# Patient Record
Sex: Male | Born: 1980 | Race: Black or African American | Hispanic: No | Marital: Single | Smoking: Current every day smoker
Health system: Southern US, Community
[De-identification: ages and names within clinical notes are randomized; demographics above are authoritative.]

## PROBLEM LIST (undated history)

## (undated) DIAGNOSIS — J45909 Unspecified asthma, uncomplicated: Secondary | ICD-10-CM

## (undated) DIAGNOSIS — I10 Essential (primary) hypertension: Secondary | ICD-10-CM

## (undated) DIAGNOSIS — G4730 Sleep apnea, unspecified: Secondary | ICD-10-CM

## (undated) DIAGNOSIS — F4310 Post-traumatic stress disorder, unspecified: Secondary | ICD-10-CM

## (undated) DIAGNOSIS — E119 Type 2 diabetes mellitus without complications: Secondary | ICD-10-CM

## (undated) DIAGNOSIS — F319 Bipolar disorder, unspecified: Secondary | ICD-10-CM

## (undated) DIAGNOSIS — H5789 Other specified disorders of eye and adnexa: Secondary | ICD-10-CM

## (undated) DIAGNOSIS — G47419 Narcolepsy without cataplexy: Secondary | ICD-10-CM

## (undated) HISTORY — DX: Unspecified asthma, uncomplicated: J45.909

## (undated) HISTORY — PX: TONSILLECTOMY: SUR1361

## (undated) HISTORY — DX: Type 2 diabetes mellitus without complications: E11.9

---

## 2006-02-03 ENCOUNTER — Emergency Department (HOSPITAL_COMMUNITY): Admission: EM | Admit: 2006-02-03 | Discharge: 2006-02-03 | Payer: Self-pay | Admitting: Emergency Medicine

## 2006-03-18 ENCOUNTER — Emergency Department (HOSPITAL_COMMUNITY): Admission: EM | Admit: 2006-03-18 | Discharge: 2006-03-18 | Payer: Self-pay | Admitting: Emergency Medicine

## 2007-09-05 ENCOUNTER — Emergency Department (HOSPITAL_COMMUNITY): Admission: EM | Admit: 2007-09-05 | Discharge: 2007-09-06 | Payer: Self-pay | Admitting: Emergency Medicine

## 2008-12-08 ENCOUNTER — Emergency Department (HOSPITAL_COMMUNITY): Admission: EM | Admit: 2008-12-08 | Discharge: 2008-12-09 | Payer: Self-pay | Admitting: Emergency Medicine

## 2009-11-09 ENCOUNTER — Emergency Department (HOSPITAL_COMMUNITY): Admission: EM | Admit: 2009-11-09 | Discharge: 2009-11-09 | Payer: Self-pay | Admitting: Emergency Medicine

## 2013-12-30 ENCOUNTER — Emergency Department (HOSPITAL_COMMUNITY): Payer: Self-pay

## 2013-12-30 ENCOUNTER — Emergency Department (HOSPITAL_COMMUNITY)
Admission: EM | Admit: 2013-12-30 | Discharge: 2013-12-30 | Disposition: A | Discharge status: Home / Self Care | Payer: Self-pay | Attending: Emergency Medicine | Admitting: Emergency Medicine

## 2013-12-30 ENCOUNTER — Encounter (HOSPITAL_COMMUNITY): Payer: Self-pay | Admitting: Emergency Medicine

## 2013-12-30 DIAGNOSIS — I10 Essential (primary) hypertension: Secondary | ICD-10-CM | POA: Insufficient documentation

## 2013-12-30 DIAGNOSIS — L02214 Cutaneous abscess of groin: Secondary | ICD-10-CM

## 2013-12-30 DIAGNOSIS — L03319 Cellulitis of trunk, unspecified: Principal | ICD-10-CM

## 2013-12-30 DIAGNOSIS — IMO0001 Reserved for inherently not codable concepts without codable children: Secondary | ICD-10-CM | POA: Insufficient documentation

## 2013-12-30 DIAGNOSIS — R6883 Chills (without fever): Secondary | ICD-10-CM | POA: Insufficient documentation

## 2013-12-30 DIAGNOSIS — F17200 Nicotine dependence, unspecified, uncomplicated: Secondary | ICD-10-CM | POA: Insufficient documentation

## 2013-12-30 DIAGNOSIS — L02219 Cutaneous abscess of trunk, unspecified: Secondary | ICD-10-CM | POA: Insufficient documentation

## 2013-12-30 DIAGNOSIS — Z880 Allergy status to penicillin: Secondary | ICD-10-CM | POA: Insufficient documentation

## 2013-12-30 HISTORY — DX: Essential (primary) hypertension: I10

## 2013-12-30 HISTORY — DX: Sleep apnea, unspecified: G47.30

## 2013-12-30 LAB — I-STAT CHEM 8, ED
BUN: 6 mg/dL (ref 6–23)
CHLORIDE: 99 meq/L (ref 96–112)
CREATININE: 1 mg/dL (ref 0.50–1.35)
Calcium, Ion: 1.12 mmol/L (ref 1.12–1.23)
Glucose, Bld: 99 mg/dL (ref 70–99)
HCT: 57 % — ABNORMAL HIGH (ref 39.0–52.0)
Hemoglobin: 19.4 g/dL — ABNORMAL HIGH (ref 13.0–17.0)
POTASSIUM: 3.9 meq/L (ref 3.7–5.3)
SODIUM: 137 meq/L (ref 137–147)
TCO2: 27 mmol/L (ref 0–100)

## 2013-12-30 LAB — CBC WITH DIFFERENTIAL/PLATELET
BASOS ABS: 0 10*3/uL (ref 0.0–0.1)
Basophils Relative: 0 % (ref 0–1)
EOS PCT: 2 % (ref 0–5)
Eosinophils Absolute: 0.2 10*3/uL (ref 0.0–0.7)
HCT: 49.5 % (ref 39.0–52.0)
Hemoglobin: 16.4 g/dL (ref 13.0–17.0)
LYMPHS ABS: 1.9 10*3/uL (ref 0.7–4.0)
LYMPHS PCT: 17 % (ref 12–46)
MCH: 29.1 pg (ref 26.0–34.0)
MCHC: 33.1 g/dL (ref 30.0–36.0)
MCV: 87.8 fL (ref 78.0–100.0)
Monocytes Absolute: 1 10*3/uL (ref 0.1–1.0)
Monocytes Relative: 9 % (ref 3–12)
NEUTROS ABS: 8.4 10*3/uL — AB (ref 1.7–7.7)
Neutrophils Relative %: 72 % (ref 43–77)
PLATELETS: 182 10*3/uL (ref 150–400)
RBC: 5.64 MIL/uL (ref 4.22–5.81)
RDW: 14.4 % (ref 11.5–15.5)
WBC: 11.5 10*3/uL — AB (ref 4.0–10.5)

## 2013-12-30 MED ORDER — SULFAMETHOXAZOLE-TRIMETHOPRIM 800-160 MG PO TABS: 1.0000 | ORAL_TABLET | Freq: Two times a day (BID) | ORAL | Status: DC

## 2013-12-30 MED ORDER — ONDANSETRON 4 MG PO TBDP
8.0000 mg | ORAL_TABLET | Freq: Once | ORAL | Status: AC
  Administered 2013-12-30: 8 mg via ORAL
  Filled 2013-12-30: qty 2

## 2013-12-30 MED ORDER — HYDROCODONE-ACETAMINOPHEN 5-325 MG PO TABS: 1.0000 | ORAL_TABLET | Freq: Four times a day (QID) | ORAL | Status: DC | PRN

## 2013-12-30 MED ORDER — CEPHALEXIN 500 MG PO CAPS: 500.0000 mg | ORAL_CAPSULE | Freq: Four times a day (QID) | ORAL | Status: DC

## 2013-12-30 MED ORDER — OXYCODONE-ACETAMINOPHEN 5-325 MG PO TABS
1.0000 | ORAL_TABLET | Freq: Once | ORAL | Status: AC
  Administered 2013-12-30: 1 via ORAL
  Filled 2013-12-30: qty 1

## 2013-12-30 NOTE — ED Notes (Addendum)
Tatyana, PA-C at bedside.   

## 2013-12-30 NOTE — Discharge Instructions (Signed)
Keflex and bactrim for infection until all gone. Ibuprofen or tylenol for pain. norco for severe pain. Warm baths or compresses. Follow up with either surgery or here in 2 days. Return sooner if worsening.    Abscess Care After An abscess (also called a boil or furuncle) is an infected area that contains a collection of pus. Signs and symptoms of an abscess include pain, tenderness, redness, or hardness, or you may feel a moveable soft area under your skin. An abscess can occur anywhere in the body. The infection may spread to surrounding tissues causing cellulitis. A cut (incision) by the surgeon was made over your abscess and the pus was drained out. Gauze may have been packed into the space to provide a drain that will allow the cavity to heal from the inside outwards. The boil may be painful for 5 to 7 days. Most people with a boil do not have high fevers. Your abscess, if seen early, may not have localized, and may not have been lanced. If not, another appointment may be required for this if it does not get better on its own or with medications. HOME CARE INSTRUCTIONS   Only take over-the-counter or prescription medicines for pain, discomfort, or fever as directed by your caregiver.  When you bathe, soak and then remove gauze or iodoform packs at least daily or as directed by your caregiver. You may then wash the wound gently with mild soapy water. Repack with gauze or do as your caregiver directs. SEEK IMMEDIATE MEDICAL CARE IF:   You develop increased pain, swelling, redness, drainage, or bleeding in the wound site.  You develop signs of generalized infection including muscle aches, chills, fever, or a general ill feeling.  An oral temperature above 102 F (38.9 C) develops, not controlled by medication. See your caregiver for a recheck if you develop any of the symptoms described above. If medications (antibiotics) were prescribed, take them as directed. Document Released: 10/28/2004  Document Revised: 07/04/2011 Document Reviewed: 06/25/2007 [facility] Patient Information 2015 [place], [place]. This information is not intended to replace advice given to you by your health care provider. Make sure you discuss any questions you have with your health care provider.

## 2013-12-30 NOTE — ED Provider Notes (Signed)
CSN: [id number]     Arrival date & time 12/30/13  1208 History   First MD Initiated Contact with Patient 12/30/13 1505     Chief Complaint  Patient presents with  . Recurrent Skin Infections     (Consider location/radiation/quality/duration/timing/severity/associated sxs/prior Treatment) HPI Kenneth Mcfarland is a 33 y.o. male who presents to emergency department complaining of swelling, tenderness, induration to the left groin. Patient states it started as a small bone, he states he tried to pop it but was unable to. Symptoms began 6 days ago. Since then it has enlarged, and became more tender. He reports prior history of small abscess these, that he would entrain himself at home. He denies any fever, however he states he has had chills for last several days. She denies any nausea vomiting. No pain or swelling in his scrotum. No other complaints.   Past Medical History  Diagnosis Date  . Hypertension   . Sleep apnea    Past Surgical History  Procedure Laterality Date  . Tonsillectomy     History reviewed. No pertinent family history. History  Substance Use Topics  . Smoking status: Current Every Day Smoker  . Smokeless tobacco: Not on file  . Alcohol Use: Yes    Review of Systems  Constitutional: Positive for chills. Negative for fever.  Respiratory: Negative for cough, chest tightness and shortness of breath.   Cardiovascular: Negative for chest pain, palpitations and leg swelling.  Gastrointestinal: Negative for nausea, vomiting, abdominal pain, diarrhea and abdominal distention.  Genitourinary: Negative for dysuria, urgency, frequency, hematuria, scrotal swelling and testicular pain.  Musculoskeletal: Positive for myalgias. Negative for neck pain and neck stiffness.  Skin: Positive for wound. Negative for rash.  Allergic/Immunologic: Negative for immunocompromised state.  Neurological: Negative for dizziness, weakness, light-headedness, numbness and headaches.       Allergies  Contrast media and Penicillins  Home Medications   Prior to Admission medications   Not on File   BP 145/87  Pulse 96  Temp(Src) 99.3 F (37.4 C) (Oral)  Resp 27  Ht  (1.753 m)  Wt 280 lb (127.007 kg)  BMI 41.33 kg/m2  SpO2 97% Physical Exam  Nursing note and vitals reviewed. Constitutional: He appears well-developed and well-nourished. No distress.  HENT:  Head: Normocephalic and atraumatic.  Eyes: Conjunctivae are normal.  Neck: Neck supple.  Cardiovascular: Normal rate, regular rhythm and normal heart sounds.   Pulmonary/Chest: Effort normal. No respiratory distress. He has no wheezes. He has no rales.  Musculoskeletal: He exhibits no edema.  Neurological: He is alert.  Skin: Skin is warm and dry.  4cm x6cm area of induration, swelling. fluctuance. Tender to palpation. No drainage. No surrounding cellulitis.     ED Course  Procedures (including critical care time) Labs Review Labs Reviewed  CBC WITH DIFFERENTIAL - Abnormal; Notable for the following:    WBC 11.5 (*)    Neutro Abs 8.4 (*)    All other components within normal limits  I-STAT CHEM 8, ED - Abnormal; Notable for the following:    Hemoglobin 19.4 (*)    HCT 57.0 (*)    All other components within normal limits    Imaging Review US Pelvis Limited  12/30/2013   CLINICAL DATA:  Elevated white blood cell count, palpable mass at the left lateral groin. No reported recent surgery.  EXAM: US PELVIS LIMITED  TECHNIQUE: Ultrasound examination of the pelvic soft tissues was performed in the area of clinical concern.  COMPARISON:  None.  FINDINGS: There is a fluid collection with irregular borders and low level internal echoes in the left groin at the area of the palpable abnormality, measuring 2.7 x 1.4 by 2.0 cm. An adjacent left inguinal lymph node with cortical thickening, likely reactive, measures 1.8 cm.  IMPRESSION: Apparent soft tissue/subcutaneous abscess, left groin, with adjacent  probable reactive lymphadenopathy.   Electronically Signed   By: Christiana Pellant M.D.   On: 12/30/2013 19:11     EKG Interpretation None      INCISION AND DRAINAGE Performed by: Jaynie Crumble A Consent: Verbal consent obtained. Risks and benefits: risks, benefits and alternatives were discussed Type: abscess  Body area: left groin  Anesthesia: local infiltration  Incision was made with a scalpel.  Local anesthetic: lidocaine 2% w epinephrine  Anesthetic total: 4 ml  Complexity: complex Blunt dissection to break up loculations  Drainage: purulent  Drainage amount: large  Packing material: 1/4 in iodoform gauze  Patient tolerance: Patient tolerated the procedure well with no immediate complications.    MDM   Final diagnoses:  Abscess of left groin    Patient with left groin abscess, he is afebrile, nontoxic appearing. Discussed with Dr. Rhunette Croft who recommended [place] to evaluate the size and extent of this abscess.   After US obtained, I decided to I&D abscess at bed side. I&D performed with large purulent drainage. It is packed. Will follow up with surgery or return here in 2 days for recheck. Discussed signs and symptoms that should prompt his return back to the emergency department. Will start on Keflex and Bactrim. Pain medications provided. Patient is stable for discharge home at this time.  Filed Vitals:   12/30/13 1800 12/30/13 1815 12/30/13 1920 12/30/13 1930  BP: 123/80   127/61  Pulse: 91 87 79 89  Temp:      TempSrc:      Resp: Height:      Weight:      SpO2:  90% 97% 94%       Lottie Mussel, PA-C 12/30/13 2243

## 2013-12-30 NOTE — ED Notes (Addendum)
Pt reports that he developed a boil on the inner thigh on the left side that he noticed on Tuesday of last week. Reports that he tried to squeeze the area, but has been unsuccessful. Reports that the area is the size of a dollar and has become hard. Reports that he has had chills and fever in the past couple of days.

## 2013-12-30 NOTE — ED Notes (Signed)
PA at the bedside.

## 2013-12-31 NOTE — ED Provider Notes (Signed)
Medical screening examination/treatment/procedure(s) were conducted as a shared visit with non-physician practitioner(s) and myself.  I personally evaluated the patient during the encounter.   EKG Interpretation None      Pt with skin induration in the inguinal region. [place] ordered, r/o deep infection. We will get the area drained, packed. Immunocompetent.  Derwood Kaplan, MD 12/31/13 1635

## 2014-05-03 ENCOUNTER — Emergency Department (HOSPITAL_COMMUNITY): Payer: Self-pay

## 2014-05-03 ENCOUNTER — Encounter (HOSPITAL_COMMUNITY): Payer: Self-pay | Admitting: *Deleted

## 2014-05-03 ENCOUNTER — Emergency Department (HOSPITAL_COMMUNITY)
Admission: EM | Admit: 2014-05-03 | Discharge: 2014-05-03 | Disposition: A | Discharge status: Home / Self Care | Payer: Self-pay | Attending: Emergency Medicine | Admitting: Emergency Medicine

## 2014-05-03 DIAGNOSIS — S39012A Strain of muscle, fascia and tendon of lower back, initial encounter: Secondary | ICD-10-CM | POA: Insufficient documentation

## 2014-05-03 DIAGNOSIS — M25531 Pain in right wrist: Secondary | ICD-10-CM

## 2014-05-03 DIAGNOSIS — Y9389 Activity, other specified: Secondary | ICD-10-CM | POA: Insufficient documentation

## 2014-05-03 DIAGNOSIS — W19XXXA Unspecified fall, initial encounter: Secondary | ICD-10-CM

## 2014-05-03 DIAGNOSIS — Y998 Other external cause status: Secondary | ICD-10-CM | POA: Insufficient documentation

## 2014-05-03 DIAGNOSIS — G4730 Sleep apnea, unspecified: Secondary | ICD-10-CM | POA: Insufficient documentation

## 2014-05-03 DIAGNOSIS — Z79899 Other long term (current) drug therapy: Secondary | ICD-10-CM | POA: Insufficient documentation

## 2014-05-03 DIAGNOSIS — Z720 Tobacco use: Secondary | ICD-10-CM | POA: Insufficient documentation

## 2014-05-03 DIAGNOSIS — Y9289 Other specified places as the place of occurrence of the external cause: Secondary | ICD-10-CM | POA: Insufficient documentation

## 2014-05-03 DIAGNOSIS — I10 Essential (primary) hypertension: Secondary | ICD-10-CM | POA: Insufficient documentation

## 2014-05-03 DIAGNOSIS — W1839XA Other fall on same level, initial encounter: Secondary | ICD-10-CM | POA: Insufficient documentation

## 2014-05-03 DIAGNOSIS — S6991XA Unspecified injury of right wrist, hand and finger(s), initial encounter: Secondary | ICD-10-CM | POA: Insufficient documentation

## 2014-05-03 DIAGNOSIS — Z880 Allergy status to penicillin: Secondary | ICD-10-CM | POA: Insufficient documentation

## 2014-05-03 LAB — URINALYSIS, ROUTINE W REFLEX MICROSCOPIC
BILIRUBIN URINE: NEGATIVE
GLUCOSE, UA: NEGATIVE mg/dL
Hgb urine dipstick: NEGATIVE
KETONES UR: NEGATIVE mg/dL
LEUKOCYTES UA: NEGATIVE
Nitrite: NEGATIVE
PH: 7 (ref 5.0–8.0)
Protein, ur: NEGATIVE mg/dL
Specific Gravity, Urine: 1.019 (ref 1.005–1.030)
Urobilinogen, UA: 1 mg/dL (ref 0.0–1.0)

## 2014-05-03 MED ORDER — HYDROCODONE-ACETAMINOPHEN 5-325 MG PO TABS: 1.0000 | ORAL_TABLET | Freq: Four times a day (QID) | ORAL | Status: DC | PRN

## 2014-05-03 MED ORDER — DIAZEPAM 5 MG PO TABS: 5.0000 mg | ORAL_TABLET | Freq: Two times a day (BID) | ORAL | Status: DC

## 2014-05-03 MED ORDER — MELOXICAM 7.5 MG PO TABS: 7.5000 mg | ORAL_TABLET | Freq: Every day | ORAL | Status: DC

## 2014-05-03 MED ORDER — HYDROCODONE-ACETAMINOPHEN 5-325 MG PO TABS
1.0000 | ORAL_TABLET | Freq: Once | ORAL | Status: AC
  Administered 2014-05-03: 1 via ORAL
  Filled 2014-05-03: qty 1

## 2014-05-03 NOTE — Discharge Instructions (Signed)
Muscle Strain A muscle strain is an injury that occurs when a muscle is stretched beyond its normal length. Usually a small number of muscle fibers are torn when this happens. Muscle strain is rated in degrees. First-degree strains have the least amount of muscle fiber tearing and pain. Second-degree and third-degree strains have increasingly more tearing and pain.  Usually, recovery from muscle strain takes 1-2 weeks. Complete healing takes 5-6 weeks.  CAUSES  Muscle strain happens when a sudden, violent force placed on a muscle stretches it too far. This may occur with lifting, sports, or a fall.  RISK FACTORS Muscle strain is especially common in athletes.  SIGNS AND SYMPTOMS At the site of the muscle strain, there may be:  Pain.  Bruising.  Swelling.  Difficulty using the muscle due to pain or lack of normal function. DIAGNOSIS  Your health care provider will perform a physical exam and ask about your medical history. TREATMENT  Often, the best treatment for a muscle strain is resting, icing, and applying cold compresses to the injured area.  HOME CARE INSTRUCTIONS   Use the PRICE method of treatment to promote muscle healing during the first 2-3 days after your injury. The PRICE method involves:  Protecting the muscle from being injured again.  Restricting your activity and resting the injured body part.  Icing your injury. To do this, put ice in a plastic bag. Place a towel between your skin and the bag. Then, apply the ice and leave it on from 15-20 minutes each hour. After the third day, switch to moist heat packs.  Apply compression to the injured area with a splint or elastic bandage. Be careful not to wrap it too tightly. This may interfere with blood circulation or increase swelling.  Elevate the injured body part above the level of your heart as often as you can.  Only take over-the-counter or prescription medicines for pain, discomfort, or fever as directed by your  health care provider.  Warming up prior to exercise helps to prevent future muscle strains. SEEK MEDICAL CARE IF:   You have increasing pain or swelling in the injured area.  You have numbness, tingling, or a significant loss of strength in the injured area. MAKE SURE YOU:   Understand these instructions.  Will watch your condition.  Will get help right away if you are not doing well or get worse. Document Released: 04/11/2005 Document Revised: 01/30/2013 Document Reviewed: 11/08/2012 [facility] Patient Information 2015 [place], [place]. This information is not intended to replace advice given to you by your health care provider. Make sure you discuss any questions you have with your health care provider.  Wrist Pain Wrist injuries are frequent in adults and children. A sprain is an injury to the ligaments that hold your bones together. A strain is an injury to muscle or muscle cord-like structures (tendons) from stretching or pulling. Generally, when wrists are moderately tender to touch following a fall or injury, a break in the bone (fracture) may be present. Most wrist sprains or strains are better in 3 to 5 days, but complete healing may take several weeks. HOME CARE INSTRUCTIONS   Put ice on the injured area.  Put ice in a plastic bag.  Place a towel between your skin and the bag.  Leave the ice on for 15-20 minutes, 3-4 times a day, for the first 2 days, or as directed by your health care provider.  Keep your arm raised above the level of your heart whenever  possible to reduce swelling and pain.  Rest the injured area for at least 48 hours or as directed by your health care provider.  If a splint or elastic bandage has been applied, use it for as long as directed by your health care provider or until seen by a health care provider for a follow-up exam.  Only take over-the-counter or prescription medicines for pain, discomfort, or fever as directed by your health care  provider.  Keep all follow-up appointments. You may need to follow up with a specialist or have follow-up X-rays. Improvement in pain level is not a guarantee that you did not fracture a bone in your wrist. The only way to determine whether or not you have a broken bone is by X-ray. SEEK IMMEDIATE MEDICAL CARE IF:   Your fingers are swollen, very red, white, or cold and blue.  Your fingers are numb or tingling.  You have increasing pain.  You have difficulty moving your fingers. MAKE SURE YOU:   Understand these instructions.  Will watch your condition.  Will get help right away if you are not doing well or get worse. Document Released: 01/19/2005 Document Revised: 04/16/2013 Document Reviewed: 06/02/2010 [facility] Patient Information 2015 [place], [place]. This information is not intended to replace advice given to you by your health care provider. Make sure you discuss any questions you have with your health care provider.   Emergency Department Resource Guide 1) Find a Doctor and Pay Out of Pocket Although you won't have to find out who is covered by your insurance plan, it is a good idea to ask around and get recommendations. You will then need to call the office and see if the doctor you have chosen will accept you as a new patient and what types of options they offer for patients who are self-pay. Some doctors offer discounts or will set up payment plans for their patients who do not have insurance, but you will need to ask so you aren't surprised when you get to your appointment.  2) Contact Your Local Health Department Not all health departments have doctors that can see patients for sick visits, but many do, so it is worth a call to see if yours does. If you don't know where your local health department is, you can check in your phone book. The CDC also has a tool to help you locate your state's health department, and many state websites also have listings of all of their local  health departments.  3) Find a Walk-in Clinic If your illness is not likely to be very severe or complicated, you may want to try a walk in clinic. These are popping up all over the country in pharmacies, drugstores, and shopping centers. They're usually staffed by nurse practitioners or physician assistants that have been trained to treat common illnesses and complaints. They're usually fairly quick and inexpensive. However, if you have serious medical issues or chronic medical problems, these are probably not your best option.  No Primary Care Doctor: - Call Health Connect at  [phone] - they can help you locate a primary care doctor that  accepts your insurance, provides certain services, etc. - Physician Referral Service- [phone]  Chronic Pain Problems: Organization         Address  Phone   Notes  [facility]  [phone] Patients need to be referred by their primary care doctor.   Medication Assistance: Organization         Address  Phone   Notes  [facility] Medication [facility] [address] [place], [place] [address] [phone]) [phone] --Must be a resident of [facility] -- Must have NO insurance coverage whatsoever (no Medicaid/ Medicare, etc.) -- The pt. MUST have a primary care doctor that directs their care regularly and follows them in the community   MedAssist  [phone]) [phone]   [employer]  [phone]) [phone]    Agencies that provide inexpensive medical care: Organization         Address  Phone   Notes  [facility]  [phone]) [phone]   [facility]    [phone]   [facility] [address] [place], [place] [address] [phone]) [phone]   [facility] [address], [place] [phone](336) [phone]   Planned Parenthood    [phone](336) [phone]   [facility]    [phone](336) [phone]   [facility]  [address], [place] Phone:   [phone](336) [phone], Fax:  [phone] Hours of Operation:  9 am - 6 pm, M-F.  Also accepts Medicaid/Medicare and self-pay.  [facility]  [address], [place] Phone: [phone](336) [phone], Fax: [phone]. Hours of Operation:  8:30 am - 5:30 pm, M-F.  Also accepts Medicaid and self-pay.  [facility] [place] [address], [place] Phone: [phone]) [phone]   [facility] [address], [place], [place] [phone], Ext. [phone] Mondays & Thursdays: 7-9 AM.  First 15 patients are seen on a first come, first serve basis.    Medicaid-accepting [facility] Providers:  Organization         Address  Phone   Notes  [facility] [address], [place] [phone] Also accepts self-pay patients.  [facility] [address], [place]  [phone]) [phone]   [facility] [address], [place] [phone]   [facility] [address] [place] [address], [place] [phone]) [phone]   Renaye RakersVeita Bland [address], [place]   [phone] Only accepts [place] Access [place] patients after they have their name applied to their card.   Self-Pay (no insurance) in [facility] [place]:  Organization         Address  Phone   Notes  Sickle Cell Patients, [facility] [address], [place] [phone]) [phone]   [facility] [address], [place] [phone]) [phone]   [facility] [place]  [address], [place] [phone]) [phone]   [facility]/Dr. Osei-Bonsu  [address] [place] [address], [place] or [phone], [place] [phone]) [phone] Phone number for both [place] and [place] locations is the same.  [facility] [address], [place] [phone]) [phone]   [facility] [place] [address] [place] [address], [place] or [address] [phone](336)  [phone]) [phone]   [facility] [address], [place] [phone]) [phone], phone; [phone]) [phone], fax Sees patients 1st and 3rd Saturday of every month.  Must not qualify for public or private insurance (i.e. Medicaid, Medicare, [place] Health Choice, Veterans' Benefits)  Household income should be no more than 200% of the poverty level The clinic cannot treat you if you are pregnant or think you are pregnant  Sexually transmitted diseases are not treated at the clinic.    Dental Care: Organization         Address  Phone  Notes  [facility] of [facility] [place] [phone] Accepts children up to age 24 who are enrolled in [place] or [place]; pregnant women with a Medicaid card; and children who have applied for Medicaid or [place] Health Choice, but were declined, whose parents can pay a reduced fee at time of service.  [facility] of [facility]  [address], [place] [phone] Accepts children up to age 66 who are enrolled in [place] or [place]; pregnant women with a Medicaid card; and children who have applied for Medicaid or [place] Health Choice, but were declined, whose parents can pay a reduced fee at time of service.  [facility] Adult [facility] Access PROGRAM  [place] [phone] Patients are seen by appointment only. Walk-ins are not accepted. [place] will see patients 80 years of age and older. Monday - Tuesday (8am-5pm) Most Wednesdays (8:30-5pm) $30 per visit, cash only  [facility] Adult Dental Access PROGRAM  [address], [facility] [phone] Patients are seen by appointment only. Walk-ins are not accepted. [place] will see patients 68 years of age and older. One Wednesday Evening (Monthly: Volunteer Based).  $30 per visit, cash only  [employer]  [phone] for adults;  Children under age 32, call Graduate Pediatric Dentistry at [phone]. Children aged 8-14, please call [phone] to request a pediatric application.  Dental services are provided in all areas of dental care including fillings, crowns and bridges, complete and partial dentures, implants, gum treatment, root canals, and extractions. Preventive care is also provided. Treatment is provided to both adults and children. Patients are selected via a lottery and there is often a waiting list.   [facility] [address], [place]  [phone] [email or web address]   [facility] [address] [place], [place] [phone], Ext. [phone] Second and Fourth Thursday of each month, opens at 6:30 AM; Clinic ends at 9 AM.  Patients are seen on a first-come first-served basis, and a limited number are seen during each clinic.   [facility]  [address] Hillard Danker [place], [place] [phone]   Eligibility Requirements You must have lived in [place], [place], or [place] counties for at least the last three months.   You cannot be eligible for state or federal sponsored [employer], including [employer], [place], or [employer].   You generally cannot be eligible for healthcare insurance through your employer.    How to apply: Eligibility screenings are held every Tuesday and Wednesday afternoon from 1:00 pm until 4:00 pm. You do not need an appointment for the interview!  [facility] [address] [place], [place]   [place]  [place] Department  [place]  [phone]    Behavioral Health Resources in the Community: Intensive Outpatient Programs Organization         Address  Phone  Notes  [place]. [address], [place], [place] [phone]   [facility] Outpatient [address] [place], [place]   ADS: [facility] Drug S[facility] [address], [place], [place]  [place] [address],  [place], [place] or [phone]   Substance Abuse Resources Organization         Address  Phone  Notes  Alcohol and Drug Services  [phone]   [place]  [phone]   [facility] [place] [facility]   Chinita Pester  [phone]   Residential & Outpatient Substance Abuse Program  [phone]   Psychological Services Organization         Address  Phone  Notes  [facility]  [place]  [phone]   [address], [place] or 9143676191    Mobile Crisis Teams Organization         Address  Phone  Notes  [facility], Mobile Crisis Care Unit  [phone]   [facility]  [address] [place], [place]   [place] Levels [address], [place] [phone]    Self-Help/Support Groups Organization         Address  Phone             Notes  [place]. of [place] - variety of support groups  [place] Call for more information  Narcotics Anonymous (NA), Caring [facility] [address], [employer] [place]  2 meetings at this location   Special educational needs teacher         Address  Phone  Notes  [facility]AP Residential Treatment [place],    [place]  [phone]   [facility]  [address], [place] [address]256, [place], [place]   [place], [place] [phone] Admissions: 8am-3pm M-F  Incentives Substance [place] [address],    [place], [place] [phone]   [facility] [address] [place], [place], [place]   [facility] [address],  [place], [place]   Insight Programs - Intensive  Outpatient [address], [place], [place]   [facility] ([address]edy.) [place]  [place], [place] [phone] or [phone]   [facility] (RTS) [address], [place], [place] Accepts Medicaid  [facility] [place] [address],  [place] [phone] Substance Abuse/Addiction Treatment   [facility] Organization         Address  Phone  Notes  [facility]  [phone]   Domenic Schwab, PhD [address] Arlis Porta [place], [place]   [phone] or [phone]   Simpson Hyde Sully [place], [place] [phone]   [facility] [address], [place], [place] [phone] Insurance/Medicaid/sponsorship through [facility] and Families [address], [address]                                    [place], [place] [phone] [place] [address], [place] [phone]    Dr. Adele Schilder  [phone]   [facility] Dept. 1) [address], [place] 2) [place] 3)  [place] [address], [place] [phone]  [phone]   [place] [phone] or [phone] (  After Hours)    ° ° ° °

## 2014-05-03 NOTE — ED Provider Notes (Signed)
34 year old male presents after having a fall onto his lower back, this happened on Monday, he has had ongoing pain in the lower back in the bilateral lower back since that time, on exam he has tenderness but no neurologic deficits, he is appears well, he is able to tolerate normal food in the room, his pain has been controlled adequately and he appears stable for discharge after imaging shows no signs of fracture. The patient was informed of his results.  Medical screening examination/treatment/procedure(s) were conducted as a shared visit with non-physician practitioner(s) and myself.  I personally evaluated the patient during the encounter.  Clinical Impression:   Final diagnoses:  Fall  Low back strain, initial encounter  Right wrist pain         Vida RollerBrian D Mylinh Cragg, MD 05/03/14 2228
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
## 2014-05-03 NOTE — ED Notes (Addendum)
Pt reports falling on Monday, landed on right hand and now has swelling to his hand. Reports lower back pain, hx of same. Ambulatory at triage. 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

## 2014-05-07 ENCOUNTER — Encounter (HOSPITAL_COMMUNITY): Payer: Self-pay | Admitting: *Deleted

## 2014-05-07 ENCOUNTER — Emergency Department (HOSPITAL_COMMUNITY)
Admission: EM | Admit: 2014-05-07 | Discharge: 2014-05-07 | Disposition: A | Discharge status: Home / Self Care | Payer: Self-pay | Attending: Emergency Medicine | Admitting: Emergency Medicine

## 2014-05-07 DIAGNOSIS — M25561 Pain in right knee: Secondary | ICD-10-CM | POA: Insufficient documentation

## 2014-05-07 DIAGNOSIS — M5441 Lumbago with sciatica, right side: Secondary | ICD-10-CM | POA: Insufficient documentation

## 2014-05-07 DIAGNOSIS — I10 Essential (primary) hypertension: Secondary | ICD-10-CM | POA: Insufficient documentation

## 2014-05-07 DIAGNOSIS — Z720 Tobacco use: Secondary | ICD-10-CM | POA: Insufficient documentation

## 2014-05-07 DIAGNOSIS — Z791 Long term (current) use of non-steroidal anti-inflammatories (NSAID): Secondary | ICD-10-CM | POA: Insufficient documentation

## 2014-05-07 DIAGNOSIS — G8911 Acute pain due to trauma: Secondary | ICD-10-CM | POA: Insufficient documentation

## 2014-05-07 DIAGNOSIS — Z880 Allergy status to penicillin: Secondary | ICD-10-CM | POA: Insufficient documentation

## 2014-05-07 DIAGNOSIS — Z79899 Other long term (current) drug therapy: Secondary | ICD-10-CM | POA: Insufficient documentation

## 2014-05-07 MED ORDER — HYDROMORPHONE HCL 1 MG/ML IJ SOLN
2.0000 mg | Freq: Once | INTRAMUSCULAR | Status: AC
  Administered 2014-05-07: 2 mg via INTRAMUSCULAR
  Filled 2014-05-07: qty 2

## 2014-05-07 MED ORDER — OXYCODONE-ACETAMINOPHEN 5-325 MG PO TABS: 1.0000 | ORAL_TABLET | ORAL | Status: DC | PRN

## 2014-05-07 MED ORDER — CYCLOBENZAPRINE HCL 10 MG PO TABS: 10.0000 mg | ORAL_TABLET | Freq: Two times a day (BID) | ORAL | Status: DC | PRN

## 2014-05-07 NOTE — Discharge Instructions (Signed)
Back Pain, Adult °Back pain is very common. The pain often gets better over time. The cause of back pain is usually not dangerous. Most people can learn to manage their back pain on their own.  °HOME CARE  °· Stay active. Start with short walks on flat ground if you can. Try to walk farther each day. °· Do not sit, drive, or stand in one place for more than 30 minutes. Do not stay in bed. °· Do not avoid exercise or work. Activity can help your back heal faster. °· Be careful when you bend or lift an object. Bend at your knees, keep the object close to you, and do not twist. °· Sleep on a firm mattress. Lie on your side, and bend your knees. If you lie on your back, put a pillow under your knees. °· Only take medicines as told by your doctor. °· Put ice on the injured area. °¨ Put ice in a plastic bag. °¨ Place a towel between your skin and the bag. °¨ Leave the ice on for 15-20 minutes, 03-04 times a day for the first 2 to 3 days. After that, you can switch between ice and heat packs. °· Ask your doctor about back exercises or massage. °· Avoid feeling anxious or stressed. Find good ways to deal with stress, such as exercise. °GET HELP RIGHT AWAY IF:  °· Your pain does not go away with rest or medicine. °· Your pain does not go away in 1 week. °· You have new problems. °· You do not feel well. °· The pain spreads into your legs. °· You cannot control when you poop (bowel movement) or pee (urinate). °· Your arms or legs feel weak or lose feeling (numbness). °· You feel sick to your stomach (nauseous) or throw up (vomit). °· You have belly (abdominal) pain. °· You feel like you may pass out (faint). °MAKE SURE YOU:  °· Understand these instructions. °· Will watch your condition. °· Will get help right away if you are not doing well or get worse. °Document Released: 09/28/2007 Document Revised: 07/04/2011 Document Reviewed: 08/13/2013 °[employer]® Patient Information ©2015 [employer]. This information is not intended  to replace advice given to you by your health care provider. Make sure you discuss any questions you have with your health care provider. °Cryotherapy °Cryotherapy means treatment with cold. Ice or gel packs can be used to reduce both pain and swelling. Ice is the most helpful within the first 24 to 48 hours after an injury or flare-up from overusing a muscle or joint. Sprains, strains, spasms, burning pain, shooting pain, and aches can all be eased with ice. Ice can also be used when recovering from surgery. Ice is effective, has very few side effects, and is safe for most people to use. °PRECAUTIONS  °Ice is not a safe treatment option for people with: °· Raynaud phenomenon. This is a condition affecting small blood vessels in the extremities. Exposure to cold may cause your problems to return. °· Cold hypersensitivity. There are many forms of cold hypersensitivity, including: °¨ Cold urticaria. Red, itchy hives appear on the skin when the tissues begin to warm after being iced. °¨ Cold erythema. This is a red, itchy rash caused by exposure to cold. °¨ Cold hemoglobinuria. Red blood cells break down when the tissues begin to warm after being iced. The hemoglobin that carry oxygen are passed into the urine because they cannot combine with blood proteins fast enough. °· Numbness or altered sensitivity   in the area being iced. °If you have any of the following conditions, do not use ice until you have discussed cryotherapy with your caregiver: °· Heart conditions, such as arrhythmia, angina, or chronic heart disease. °· High blood pressure. °· Healing wounds or open skin in the area being iced. °· Current infections. °· Rheumatoid arthritis. °· Poor circulation. °· Diabetes. °Ice slows the blood flow in the region it is applied. This is beneficial when trying to stop inflamed tissues from spreading irritating chemicals to surrounding tissues. However, if you expose your skin to cold temperatures for too long or  without the proper protection, you can damage your skin or nerves. Watch for signs of skin damage due to cold. °HOME CARE INSTRUCTIONS °Follow these tips to use ice and cold packs safely. °· Place a dry or damp towel between the ice and skin. A damp towel will cool the skin more quickly, so you may need to shorten the time that the ice is used. °· For a more rapid response, add gentle compression to the ice. °· Ice for no more than 10 to 20 minutes at a time. The bonier the area you are icing, the less time it will take to get the benefits of ice. °· Check your skin after 5 minutes to make sure there are no signs of a poor response to cold or skin damage. °· Rest 20 minutes or more between uses. °· Once your skin is numb, you can end your treatment. You can test numbness by very lightly touching your skin. The touch should be so light that you do not see the skin dimple from the pressure of your fingertip. When using ice, most people will feel these normal sensations in this order: cold, burning, aching, and numbness. °· Do not use ice on someone who cannot communicate their responses to pain, such as small children or people with dementia. °HOW TO MAKE AN ICE PACK °Ice packs are the most common way to use ice therapy. Other methods include ice massage, ice baths, and cryosprays. Muscle creams that cause a cold, tingly feeling do not offer the same benefits that ice offers and should not be used as a substitute unless recommended by your caregiver. °To make an ice pack, do one of the following: °· Place crushed ice or a bag of frozen vegetables in a sealable plastic bag. Squeeze out the excess air. Place this bag inside another plastic bag. Slide the bag into a pillowcase or place a damp towel between your skin and the bag. °· Mix 3 parts water with 1 part rubbing alcohol. Freeze the mixture in a sealable plastic bag. When you remove the mixture from the freezer, it will be slushy. Squeeze out the excess air. Place  this bag inside another plastic bag. Slide the bag into a pillowcase or place a damp towel between your skin and the bag. °SEEK MEDICAL CARE IF: °· You develop white spots on your skin. This may give the skin a blotchy (mottled) appearance. °· Your skin turns blue or pale. °· Your skin becomes waxy or hard. °· Your swelling gets worse. °MAKE SURE YOU:  °· Understand these instructions. °· Will watch your condition. °· Will get help right away if you are not doing well or get worse. °Document Released: 12/06/2010 Document Revised: 08/26/2013 Document Reviewed: 12/06/2010 °Exit[employer]® Patient Information ©2015 [employer]. This information is not intended to replace advice given to you by your health care provider. Make sure you discuss any   questions you have with your health care provider. ° °

## 2014-05-07 NOTE — ED Notes (Signed)
Pt was seen here on Saturday for back pain after a fall. Reports no relief with prescriptions. Now has pain and swelling to right knee, reports its warm to touch.

## 2014-05-07 NOTE — ED Provider Notes (Signed)
CSN: [id number]     Arrival date & time 05/07/14  1808 History   First MD Initiated Contact with Patient 05/07/14 2108     Chief Complaint  Patient presents with  . Knee Pain  . Back Pain     (Consider location/radiation/quality/duration/timing/severity/associated sxs/prior Treatment) Patient is a 34 y.o. male presenting with back pain. The history is provided by the patient. No language interpreter was used.  Back Pain Location:  Lumbar spine Quality:  Stabbing and stiffness Stiffness is present:  All day Pain severity:  Severe Associated symptoms: no fever   Associated symptoms comment:  The patient returns to the ED after evaluation on 05/03/14 that occurred on 04/28/14 after he slipped, fell and landed on right hip and right wrist. He reports negative x-rays performed at that time to back, wrist and knee. He returns with complaint of persistent and worsening low back pain and right knee pain with intermittent swelling. He is taking Norco and Mobic without relief. He has been unable to return to work because the pain is worse with standing and he works at [employer].    Past Medical History  Diagnosis Date  . Hypertension   . Sleep apnea    Past Surgical History  Procedure Laterality Date  . Tonsillectomy     History reviewed. No pertinent family history. History  Substance Use Topics  . Smoking status: Current Every Day Smoker  . Smokeless tobacco: Not on file  . Alcohol Use: Yes    Review of Systems  Constitutional: Negative for fever and chills.  HENT: Negative.   Respiratory: Negative.   Cardiovascular: Negative.   Gastrointestinal: Negative.   Musculoskeletal: Positive for back pain.       See HPI  Skin: Negative.   Neurological: Negative.       Allergies  Contrast media and Penicillins  Home Medications   Prior to Admission medications   Medication Sig Start Date End Date Taking? Authorizing Provider  cephALEXin (KEFLEX) 500 MG capsule Take 1 capsule (500  mg total) by mouth 4 (four) times daily. Patient not taking: Reported on 05/03/2014 12/30/13   Tatyana A Kirichenko, PA-C  diazepam (VALIUM) 5 MG tablet Take 1 tablet (5 mg total) by mouth 2 (two) times daily. 05/03/14   Courtney A Forcucci, PA-C  HYDROcodone-acetaminophen (NORCO/VICODIN) 5-325 MG per tablet Take 1 tablet by mouth every 6 (six) hours as needed. 05/03/14   Courtney A Forcucci, PA-C  meloxicam (MOBIC) 7.5 MG tablet Take 1 tablet (7.5 mg total) by mouth daily. 05/03/14   Courtney A Forcucci, PA-C  sulfamethoxazole-trimethoprim (SEPTRA DS) 800-160 MG per tablet Take 1 tablet by mouth every 12 (twelve) hours. Patient not taking: Reported on 05/03/2014 12/30/13   Tatyana A Kirichenko, PA-C   BP 142/99 mmHg  Pulse 94  Temp(Src) 98.5 F (36.9 C)  Resp 20  SpO2 97% Physical Exam  Constitutional: He is oriented to person, place, and time. He appears well-developed and well-nourished.  Neck: Normal range of motion.  Pulmonary/Chest: Effort normal.  Abdominal: Soft. He exhibits no mass. There is no tenderness.  Musculoskeletal: Normal range of motion.  Right paralumbar tenderness without swelling, discoloration. No sciatic tenderness. Right knee is unremarkable in appearance without swelling, discoloration, warmth. Joint stable. No calf or thigh tenderness.   Neurological: He is alert and oriented to person, place, and time. He has normal reflexes. No sensory deficit. Coordination normal.  Skin: Skin is warm and dry.  Psychiatric: He has a normal mood and affect.  ED Course  Procedures (including critical care time) Labs Review Labs Reviewed - No data to display  Imaging Review No results found.   EKG Interpretation None      MDM   Final diagnoses:  None    1. Low back pain 2. Right knee pain  Chart reviewed. The patient was evaluated with imaging on previous visit after fall and x-rays were negative for fracture.  He is feeling better with IM pain medication in ED.  Ambulatory with improved mobility. Encouraged PCP and/or orthopedic follow up.     Arnoldo Hooker, PA-C 05/08/14 1610  Doug Sou, MD 05/10/14 Jacinta Shoe

## 2014-08-11 ENCOUNTER — Emergency Department (HOSPITAL_COMMUNITY)
Admission: EM | Admit: 2014-08-11 | Discharge: 2014-08-12 | Disposition: A | Discharge status: Home / Self Care | Payer: Self-pay | Attending: Emergency Medicine | Admitting: Emergency Medicine

## 2014-08-11 ENCOUNTER — Encounter (HOSPITAL_COMMUNITY): Payer: Self-pay | Admitting: Emergency Medicine

## 2014-08-11 DIAGNOSIS — Z79899 Other long term (current) drug therapy: Secondary | ICD-10-CM | POA: Insufficient documentation

## 2014-08-11 DIAGNOSIS — Z791 Long term (current) use of non-steroidal anti-inflammatories (NSAID): Secondary | ICD-10-CM | POA: Insufficient documentation

## 2014-08-11 DIAGNOSIS — R51 Headache: Secondary | ICD-10-CM | POA: Insufficient documentation

## 2014-08-11 DIAGNOSIS — F332 Major depressive disorder, recurrent severe without psychotic features: Secondary | ICD-10-CM | POA: Diagnosis present

## 2014-08-11 DIAGNOSIS — Z720 Tobacco use: Secondary | ICD-10-CM | POA: Insufficient documentation

## 2014-08-11 DIAGNOSIS — R45850 Homicidal ideations: Secondary | ICD-10-CM | POA: Insufficient documentation

## 2014-08-11 DIAGNOSIS — Z880 Allergy status to penicillin: Secondary | ICD-10-CM | POA: Insufficient documentation

## 2014-08-11 DIAGNOSIS — R45851 Suicidal ideations: Secondary | ICD-10-CM | POA: Insufficient documentation

## 2014-08-11 DIAGNOSIS — Z8669 Personal history of other diseases of the nervous system and sense organs: Secondary | ICD-10-CM | POA: Insufficient documentation

## 2014-08-11 DIAGNOSIS — Z792 Long term (current) use of antibiotics: Secondary | ICD-10-CM | POA: Insufficient documentation

## 2014-08-11 DIAGNOSIS — I10 Essential (primary) hypertension: Secondary | ICD-10-CM | POA: Insufficient documentation

## 2014-08-11 LAB — COMPREHENSIVE METABOLIC PANEL
ALT: 19 U/L (ref 0–53)
AST: 20 U/L (ref 0–37)
Albumin: 4.1 g/dL (ref 3.5–5.2)
Alkaline Phosphatase: 87 U/L (ref 39–117)
Anion gap: 6 (ref 5–15)
BUN: 9 mg/dL (ref 6–23)
CO2: 30 mmol/L (ref 19–32)
Calcium: 8.5 mg/dL (ref 8.4–10.5)
Chloride: 100 mmol/L (ref 96–112)
Creatinine, Ser: 1.01 mg/dL (ref 0.50–1.35)
GFR calc Af Amer: >90 mL/min (ref >90)
GFR calc non Af Amer: >90 mL/min (ref >90)
Glucose, Bld: 85 mg/dL (ref 70–99)
Potassium: 3.9 mmol/L (ref 3.5–5.1)
Sodium: 136 mmol/L (ref 135–145)
Total Bilirubin: 0.4 mg/dL (ref 0.3–1.2)
Total Protein: 7.5 g/dL (ref 6.0–8.3)

## 2014-08-11 LAB — RAPID URINE DRUG SCREEN, HOSP PERFORMED
Amphetamines: NOT DETECTED
Barbiturates: NOT DETECTED
Benzodiazepines: NOT DETECTED
Cocaine: NOT DETECTED
Opiates: NOT DETECTED
Tetrahydrocannabinol: POSITIVE — AB

## 2014-08-11 LAB — ACETAMINOPHEN LEVEL: Acetaminophen (Tylenol), Serum: <10 ug/mL — ABNORMAL LOW (ref 10–30)

## 2014-08-11 LAB — CBC
HCT: 50.4 % (ref 39.0–52.0)
Hemoglobin: 15.9 g/dL (ref 13.0–17.0)
MCH: 29 pg (ref 26.0–34.0)
MCHC: 31.5 g/dL (ref 30.0–36.0)
MCV: 91.8 fL (ref 78.0–100.0)
Platelets: 196 10*3/uL (ref 150–400)
RBC: 5.49 MIL/uL (ref 4.22–5.81)
RDW: 14.2 % (ref 11.5–15.5)
WBC: 7.3 10*3/uL (ref 4.0–10.5)

## 2014-08-11 LAB — SALICYLATE LEVEL: Salicylate Lvl: <4 mg/dL (ref 2.8–20.0)

## 2014-08-11 LAB — ETHANOL: Alcohol, Ethyl (B): <5 mg/dL (ref 0–9)

## 2014-08-11 MED ORDER — ONDANSETRON HCL 4 MG PO TABS: 4.0000 mg | ORAL_TABLET | Freq: Three times a day (TID) | ORAL | Status: DC | PRN

## 2014-08-11 MED ORDER — DIAZEPAM 5 MG PO TABS
5.0000 mg | ORAL_TABLET | Freq: Two times a day (BID) | ORAL | Status: DC
  Administered 2014-08-11 – 2014-08-12 (×2): 5 mg via ORAL
  Filled 2014-08-11 (×2): qty 1

## 2014-08-11 MED ORDER — MELOXICAM 7.5 MG PO TABS
7.5000 mg | ORAL_TABLET | Freq: Every day | ORAL | Status: DC
  Administered 2014-08-11 – 2014-08-12 (×2): 7.5 mg via ORAL
  Filled 2014-08-11 (×2): qty 1

## 2014-08-11 MED ORDER — ACETAMINOPHEN 325 MG PO TABS: 650.0000 mg | ORAL_TABLET | ORAL | Status: DC | PRN

## 2014-08-11 MED ORDER — ALUM & MAG HYDROXIDE-SIMETH 200-200-20 MG/5ML PO SUSP: 30.0000 mL | ORAL | Status: DC | PRN

## 2014-08-11 NOTE — BH Assessment (Signed)
Assessment Note  Kenneth Mcfarland is an 34 y.o. male with history of depression. He presents to [facility] for a mental health assessment. Patient brought in by a counselor from the [facility] building whom he spoke to prior to coming to [facility] today. Patient sts that he is increasing depressed. His depression is triggered by watching his 77 yr old son die November 2015. Sts that his son choked on food in his kitchen and died dispite his efforts to recesitate him. Patient's fiance and grandmother also died in Sep 11, 2012 and he watched both of them die. Since the death of his love ones patient has not been coping well. He loss his job a few days ago stating he fell asleep on the job. Patient reports having epilepsy and sleep apnea stating, "I didn't mean to fall asleep it's part of my disorder". Patient also tried living with family members after his sons death but didn't feel welcome. He left his family home and has been homeless since January 2016. Patient presents with a flat affect. His mood is depressed. He denies HI. Patient has a AVH's. Sts that voices tell him,  "It's your fault..Yours should have saved your son.Marland KitchenMarland KitchenHe is dead because of you".   Axis I: Major Depressive Disorder, Recurrent, Severe, with psychotic features and Anxiety Disorder NOS Axis II: Deferred Axis III:  Past Medical History  Diagnosis Date  . Hypertension   . Sleep apnea    Axis IV: other psychosocial or environmental problems, problems related to social environment, problems with access to health care services and problems with primary support group Axis V: 31-40 impairment in reality testing  Past Medical History:  Past Medical History  Diagnosis Date  . Hypertension   . Sleep apnea     Past Surgical History  Procedure Laterality Date  . Tonsillectomy      Family History: No family history on file.  Social History:  reports that he has been smoking.  He does not have any smokeless tobacco history on file. He reports that he drinks  alcohol. He reports that he uses illicit drugs (Marijuana).  Additional Social History:  Alcohol / Drug Use Pain Medications: SEE MAR Prescriptions: SEE MAR Over the Counter: SEE MAR History of alcohol / drug use?: No history of alcohol / drug abuse  CIWA: CIWA-Ar BP: 154/86 mmHg Pulse Rate: 85 COWS:    Allergies:  Allergies  Allergen Reactions  . Contrast Media [Iodinated Diagnostic Agents] Other (See Comments)    Unknown   . Penicillins Other (See Comments)    unknown    Home Medications:  (Not in a hospital admission)  OB/GYN Status:  No LMP for male patient.  General Assessment Data Location of Assessment: [facility] ED Is this a Tele or Face-to-Face Assessment?: Face-to-Face Is this an Initial Assessment or a Re-assessment for this encounter?: Initial Assessment Living Arrangements: Other (Comment) (patient lives ) Can pt return to current living arrangement?: No Admission Status: Voluntary Is patient capable of signing voluntary admission?: Yes Transfer from: Acute Hospital Referral Source: Self/Family/Friend     [facility] Crisis Care Plan Living Arrangements: Other (Comment) (patient lives ) Name of Psychiatrist:  (No psychiatrist ) Name of Therapist:  (No therapist )  Education Status Is patient currently in school?: No  Risk to self with the past 6 months Suicidal Ideation: Yes-Currently Present Suicidal Intent: Yes-Currently Present Is patient at risk for suicide?: Yes Suicidal Plan?: Yes-Currently Present Specify Current Suicidal Plan:  (jump off bridge, cut wrist, death by police) Access  to Means: Yes Specify Access to Suicidal Means:  (bridge, sharp objects, etc. ) What has been your use of drugs/alcohol within the last 12 months?:  (none reported ) Previous Attempts/Gestures: Yes How many times?:  (1x at age 34-overdose and drank alcohol ) Other Self Harm Risks:  (none reported ) Triggers for Past Attempts: Other (Comment) (depression ) Intentional Self  Injurious Behavior: None Family Suicide History: Unknown Recent stressful life event(s): Other (Comment) (death of son, fiance, & grandmother, homeless, loss job, ) Persecutory voices/beliefs?: No Depression: Yes Depression Symptoms: Feeling angry/irritable, Feeling worthless/self pity, Loss of interest in usual pleasures, Guilt, Fatigue, Isolating, Tearfulness, Despondent, Insomnia Substance abuse history and/or treatment for substance abuse?: No Suicide prevention information given to non-admitted patients: Not applicable  Risk to Others within the past 6 months Homicidal Ideation: No Thoughts of Harm to Others: No Current Homicidal Intent: No Current Homicidal Plan: No Access to Homicidal Means: No Identified Victim:  (n/a) History of harm to others?: No Assessment of Violence: None Noted Violent Behavior Description:  (n/a) Does patient have access to weapons?: No Criminal Charges Pending?: No Does patient have a court date: No  Psychosis Hallucinations: None noted Delusions: None noted  Mental Status Report Appearance/Hygiene: Disheveled (malodorous) Eye Contact: Fair Motor Activity: Freedom of movement Speech: Logical/coherent Level of Consciousness: Alert Mood: Depressed Affect: Appropriate to circumstance Anxiety Level: None Thought Processes: Coherent, Relevant Judgement: Unimpaired Orientation: Person, Place, Time, Situation Obsessive Compulsive Thoughts/Behaviors: None  Cognitive Functioning Concentration: Decreased Memory: Recent Intact, Remote Intact IQ: Average Insight: Poor Impulse Control: Poor Appetite: Poor Weight Loss:  (unk) Weight Gain:  (unk) Sleep: Decreased Total Hours of Sleep:  ("I sleep where I can.Marland Kitchen.Marland Kitchen.I am homeless") Vegetative Symptoms: None  ADLScreening [facility] Assessment Services) Patient's cognitive ability adequate to safely complete daily activities?: Yes Patient able to express need for assistance with ADLs?: Yes Independently  performs ADLs?: Yes (appropriate for developmental age)  Prior Inpatient Therapy Prior Inpatient Therapy: No Prior Therapy Dates:  (n/a) Prior Therapy Facilty/Provider(s):  (n/a) Reason for Treatment:  (n/a)  Prior Outpatient Therapy Prior Outpatient Therapy: No Prior Therapy Dates:  (n/a) Prior Therapy Facilty/Provider(s):  (n/a) Reason for Treatment:  (n/a)  ADL Screening (condition at time of admission) Patient's cognitive ability adequate to safely complete daily activities?: Yes Is the patient deaf or have difficulty hearing?: No Does the patient have difficulty seeing, even when wearing glasses/contacts?: No Does the patient have difficulty concentrating, remembering, or making decisions?: Yes Patient able to express need for assistance with ADLs?: Yes Does the patient have difficulty dressing or bathing?: No Independently performs ADLs?: Yes (appropriate for developmental age) Does the patient have difficulty walking or climbing stairs?: No Weakness of Legs: None Weakness of Arms/Hands: None  Home Assistive Devices/Equipment Home Assistive Devices/Equipment: None    Abuse/Neglect Assessment (Assessment to be complete while patient is alone) Physical Abuse: Denies Verbal Abuse: Denies Sexual Abuse: Denies Exploitation of patient/patient's resources: Denies Self-Neglect: Denies Values / Beliefs Cultural Requests During Hospitalization: None Spiritual Requests During Hospitalization: None   Advance Directives (For Healthcare) Does patient have an advance directive?: No Would patient like information on creating an advanced directive?: No - patient declined information    Additional Information 1:1 In Past 12 Months?: No CIRT Risk: No Elopement Risk: No Does patient have medical clearance?: No     Disposition:  Disposition Initial Assessment Completed for this Encounter: Yes Disposition of Patient: Inpatient treatment program [facility](Jamison Lord, NP recommends  inpatient treatment. )  On Site  Evaluation by:   Reviewed with Physician:    Melynda Ripple [facility] 08/11/2014 7:24 PM

## 2014-08-11 NOTE — ED Notes (Signed)
Pt AAO x 3, resting at present, presents with depression, SI, plan to jump off bridge.  Pt has a funeral program of son with him, stating that if he can't look at pictures of his son and talk to him via his phone, he cannot stay in ED and wants to go home.  Pt reports he will not break his pattern, he states.  Pt cooperative.  Monitoring for safety, Q 15 min checks in effect.

## 2014-08-11 NOTE — ED Provider Notes (Signed)
CSN: [id number]     Arrival date & time 08/11/14  1647 History   First MD Initiated Contact with Patient 08/11/14 1745     Chief Complaint  Patient presents with  . Suicidal   Kenneth Mcfarland is a 34 y.o. male with a history of depression, hypertension and sleep apnea who presents to emergency department reporting suicidal and homicidal ideations and depressed mood recently. The patient reports he recently lost his son back in November 2015. He reports feeling depressed since. He reports this is worsened recently. He reports he recently lost his job. He tells me he has a plan to kill himself and the "rest are collateral damage" when asked about homicidal ideations. He does not specify a plan to me. She reports previously being hospitalized in 1994 for suicidal ideations. Patient endorses auditory hallucinations of voices telling him "it's your fault." He denies command hallucinations. He denies visual hallucinations. Patient reports he has not been taking his blood pressure medicines for the last several months. He reports he has previously been lethargic or thiazide and lisinopril. The patient complains of intermittent headaches over the past several weeks. The patient denies fevers, chills, abdominal pain, nausea, vomiting, paranoia, visual hallucinations, or ingestion of illicit substances.   (Consider location/radiation/quality/duration/timing/severity/associated sxs/prior Treatment) HPI  Past Medical History  Diagnosis Date  . Hypertension   . Sleep apnea    Past Surgical History  Procedure Laterality Date  . Tonsillectomy     No family history on file. History  Substance Use Topics  . Smoking status: Current Every Day Smoker  . Smokeless tobacco: Not on file  . Alcohol Use: Yes    Review of Systems  Constitutional: Negative for fever and chills.  HENT: Negative for congestion and sore throat.   Eyes: Negative for visual disturbance.  Respiratory: Negative for cough, shortness of  breath and wheezing.   Cardiovascular: Negative for chest pain.  Gastrointestinal: Negative for nausea, vomiting, abdominal pain and diarrhea.  Genitourinary: Negative for dysuria and difficulty urinating.  Musculoskeletal: Negative for back pain and neck pain.  Skin: Negative for rash.  Neurological: Positive for headaches. Negative for dizziness, weakness, light-headedness and numbness.  Psychiatric/Behavioral: Positive for suicidal ideas, hallucinations and dysphoric mood. The patient is not nervous/anxious.       Allergies  Contrast media and Penicillins  Home Medications   Prior to Admission medications   Medication Sig Start Date End Date Taking? Authorizing Provider  cephALEXin (KEFLEX) 500 MG capsule Take 1 capsule (500 mg total) by mouth 4 (four) times daily. Patient not taking: Reported on 05/03/2014 12/30/13   Tatyana Kirichenko, PA-C  cyclobenzaprine (FLEXERIL) 10 MG tablet Take 1 tablet (10 mg total) by mouth 2 (two) times daily as needed for muscle spasms. Patient not taking: Reported on 08/11/2014 05/07/14   Elpidio AnisShari Upstill, PA-C  diazepam (VALIUM) 5 MG tablet Take 1 tablet (5 mg total) by mouth 2 (two) times daily. Patient not taking: Reported on 08/11/2014 05/03/14   Terri Piedraourtney Forcucci, PA-C  HYDROcodone-acetaminophen (NORCO/VICODIN) 5-325 MG per tablet Take 1 tablet by mouth every 6 (six) hours as needed. Patient not taking: Reported on 08/11/2014 05/03/14   Toni Amendourtney Forcucci, PA-C  meloxicam (MOBIC) 7.5 MG tablet Take 1 tablet (7.5 mg total) by mouth daily. Patient not taking: Reported on 08/11/2014 05/03/14   Terri Piedraourtney Forcucci, PA-C  oxyCODONE-acetaminophen (PERCOCET/ROXICET) 5-325 MG per tablet Take 1-2 tablets by mouth every 4 (four) hours as needed for severe pain. Patient not taking: Reported on 08/11/2014 05/07/14  Elpidio Anis, PA-C  sulfamethoxazole-trimethoprim (SEPTRA DS) 800-160 MG per tablet Take 1 tablet by mouth every 12 (twelve) hours. Patient not taking: Reported on  05/03/2014 12/30/13   Tatyana Kirichenko, PA-C   BP 154/86 mmHg  Pulse 85  Temp(Src) 98.3 F (36.8 C) (Oral)  Resp 18  SpO2 93% Physical Exam  Constitutional: He is oriented to person, place, and time. He appears well-developed and well-nourished. No distress.  HENT:  Head: Normocephalic and atraumatic.  Mouth/Throat: Oropharynx is clear and moist.  Eyes: Conjunctivae and EOM are normal. Pupils are equal, round, and reactive to light. Right eye exhibits no discharge. Left eye exhibits no discharge.  Neck: Neck supple. No JVD present.  Cardiovascular: Normal rate, regular rhythm, normal heart sounds and intact distal pulses.  Exam reveals no gallop and no friction rub.   No murmur heard. Pulmonary/Chest: Effort normal and breath sounds normal. No respiratory distress. He has no wheezes. He has no rales.  Abdominal: Soft. There is no tenderness.  Musculoskeletal: He exhibits no edema.  Lymphadenopathy:    He has no cervical adenopathy.  Neurological: He is alert and oriented to person, place, and time. Coordination normal.  Skin: Skin is warm and dry. No rash noted. He is not diaphoretic. No erythema. No pallor.  Psychiatric: His speech is normal. His affect is not angry and not inappropriate. He is withdrawn. He is not agitated and not aggressive. Thought content is not paranoid. He exhibits a depressed mood. He expresses homicidal and suicidal ideation. He expresses no suicidal plans and no homicidal plans.  Patient appears depressed. He has poor eye contact during interview. He endorses suicidal and homicidal ideations without a plan. He endorses auditory hallucinations. He denies command hallucinations.  Nursing note and vitals reviewed.   ED Course  Procedures (including critical care time) Labs Review Labs Reviewed  ACETAMINOPHEN LEVEL - Abnormal; Notable for the following:    Acetaminophen (Tylenol), Serum <10.0 (*)    All other components within normal limits  URINE RAPID DRUG  SCREEN (HOSP PERFORMED) - Abnormal; Notable for the following:    Tetrahydrocannabinol POSITIVE (*)    All other components within normal limits  CBC  COMPREHENSIVE METABOLIC PANEL  ETHANOL  SALICYLATE LEVEL    Imaging Review No results found.   EKG Interpretation None      Filed Vitals:   08/11/14 1704  BP: 154/86  Pulse: 85  Temp: 98.3 F (36.8 C)  TempSrc: Oral  Resp: 18  SpO2: 93%     MDM   Final diagnoses:  Suicidal ideations  Homicidal ideation   This is a 34 y.o. male with a history of depression, hypertension and sleep apnea who presents to emergency department reporting suicidal and homicidal ideations and depressed mood recently.  He does not endorse a plan to me, but he did tell the RN he would jump off a bridge. The patient appears depressed and has poor eye contact. He endorses suicidal and homicidal ideations with me. He also endorses auditory hallucinations without command hallucinations. The patient's blood work is remarkable only for a urine drug screen that is positive for THC. He is medically clear for psychiatric evaluation and admission. Will reorder home medications. The patient is in agreement with admission for psych eval at this time.      Everlene Farrier, PA-C 08/12/14 0131  Raeford Razor, MD 08/12/14 [phone]

## 2014-08-11 NOTE — ED Notes (Signed)
Pt had recent lost of son and fiance and now c/o SI/HI, pt also got let go from job which was last straw to pt and made him become SI?HI. Pt has sleep apnea and is sleeping in during triage, hard to arouse but pt is snoring and moving. [facility] and brought in by counselor whom pt talked to before coming. Pt has plan of jumping off bridge, cutting wrist and death by police. Pt has also not had medication for HTN.

## 2014-08-12 DIAGNOSIS — F332 Major depressive disorder, recurrent severe without psychotic features: Secondary | ICD-10-CM | POA: Diagnosis present

## 2014-08-12 DIAGNOSIS — R45851 Suicidal ideations: Secondary | ICD-10-CM | POA: Insufficient documentation

## 2014-08-12 DIAGNOSIS — R45850 Homicidal ideations: Secondary | ICD-10-CM | POA: Insufficient documentation

## 2014-08-12 NOTE — BH Assessment (Signed)
[facility] Assessment Progress Note  Per Thedore MinsMojeed Akintayo, MD, this pt does not require psychiatric hospitalization at this time.  [facility]  This has been included in his discharge instructions.  Pt's nurse, Carlisle BeersLuann, has been notified.    Doylene Canninghomas Aynsley Fleet, MA  Triage Specialist  08/12/2014 @ 10:31

## 2014-08-12 NOTE — [facility] Suicide Risk Assessment (Cosign Needed)
Suicide Risk Assessment  Discharge Assessment   [facility]   Demographic Factors:  Male, Adolescent or young adult, Low socioeconomic status and Unemployed  Total Time spent with patient: 20 minutes  Musculoskeletal: Strength & Muscle Tone: within normal limits Gait & Station: normal Patient leans: N/A  Psychiatric Specialty Exam:     Blood pressure 155/93, pulse 88, temperature 98.2 F (36.8 C), temperature source Oral, resp. rate 20, SpO2 95 %.There is no weight on file to calculate BMI.  General Appearance: Casual  Eye Contact::  Good  Speech:  Clear and Coherent and Normal Rate409  Volume:  Normal  Mood:  Depressed and Irritable  Affect:  Congruent and Depressed  Thought Process:  Coherent and Goal Directed  Orientation:  Full (Time, Place, and Person)  Thought Content:  WDL  Suicidal Thoughts:  No  Homicidal Thoughts:  No  Memory:  Immediate;   Good Recent;   Good Remote;   Good  Judgement:  Fair  Insight:  Fair  Psychomotor Activity:  Normal  Concentration:  Good  Recall:  NA  Fund of Knowledge:Fair  Language: Good  Akathisia:  NA  Handed:  Right  AIMS (if indicated):     Assets:  Desire for Improvement Financial Resources/Insurance Housing  Sleep:     Cognition: WNL  ADL's:  Intact      Has this patient used any form of tobacco in the last 30 days? (Cigarettes, Smokeless Tobacco, Cigars, and/or Pipes) Yes, A prescription for an FDA-approved tobacco cessation medication was offered at discharge and the patient refused  Mental Status Per Nursing Assessment::   On Admission:     Current Mental Status by Physician: NA  Loss Factors: Loss of significant relationship and Financial problems/change in socioeconomic status  Historical Factors: Prior suicide attempts  Risk Reduction Factors:   Responsible for children under 35 years of age, Sense of responsibility to family and Religious beliefs about death  Continued  Clinical Symptoms:  Depression:   Insomnia  Cognitive Features That Contribute To Risk:  Polarized thinking    Suicide Risk:  Minimal: No identifiable suicidal ideation.  Patients presenting with no risk factors but with morbid ruminations; may be classified as minimal risk based on the severity of the depressive symptoms  Principal Problem: Major depressive disorder, recurrent severe without psychotic features Discharge Diagnoses:  Patient Active Problem List   Diagnosis Date Noted  . Major depressive disorder, recurrent severe without psychotic features [F33.2] 08/12/2014    Priority: High      Plan Of Care/Follow-up recommendations:  Activity:  AS TOLERATED Diet:  REGULAR  Is patient on multiple antipsychotic therapies at discharge:  No   Has Patient had three or more failed trials of antipsychotic monotherapy by history:  No  Recommended Plan for Multiple Antipsychotic Therapies: NA    Aslin Farinas C   PMNP-BC 08/12/2014, 12:40 PM

## 2014-08-12 NOTE — Discharge Instructions (Signed)
For your ongoing behavioral health needs you are advised to follow up with [facility].  New patients are seen at their walk-in clinic.  Walk-in hours are Monday - Friday from 8:00 am - 12:00 pm, and from 1:00 pm - 3:00 pm.  Walk-in patients are seen on a first come, first served basis, so try to arrive as early as possible for the best chance of being seen the same day.  There is an initial fee of $22.50: ° °     [facility] °     [address] °     [place], [place] [address] °     [phone] °

## 2014-08-12 NOTE — Consult Note (Signed)
[facility] Face-to-Face Psychiatry Consult   Reason for Consult:  Major depression, anxiety disorder Referring Physician:  EDP Patient Identification: Kenneth Mcfarland MRN:  [id number] Principal Diagnosis: Major depressive disorder, recurrent severe without psychotic features Diagnosis:   Patient Active Problem List   Diagnosis Date Noted  . Major depressive disorder, recurrent severe without psychotic features [F33.2] 08/12/2014    Priority: High    Total Time spent with patient: 1 hour  Subjective:   Kenneth Mcfarland is a 34 y.o. male patient admitted with Depression and anxiety.  HPI: AA male, 34 years old was seen this morning for increased depression due to deaths in the family.  He reported that he lost his job, lost his apartment and that he has been dealing with the loss of his son, grandmother and fiance.   Patient reported that he started seeing a therapist at [place] but stopped because he was incarcerated for 15 days and have not been back.   Patient reports that he was feeling very bad yesterday looking for a place to rest and that he feels better today after a night sleep.  Patient rated his depression 8/10  With 10 being severe depression.   Patient today denies SI/HI/AVH and asked for discharge back to [facility].  He reports feeling irritable but blames that on his financial problems and looking for a place to stay.  Patient is being discharged home and is referred back to [facility].   HPI Elements:   Location:  Major depression. Quality:  severe. Severity:  severe. Timing:  Acute. Duration:  Chronic mental illness. Context:  seeking housing, job placement and anger issue.  Past Medical History:  Past Medical History  Diagnosis Date  . Hypertension   . Sleep apnea     Past Surgical History  Procedure Laterality Date  . Tonsillectomy     Family History: No family history on file. Social History:  History  Alcohol Use   . Yes     History  Drug Use  . Yes  . Special: Marijuana    History   Social History  . Marital Status: Single    Spouse Name: N/A  . Number of Children: N/A  . Years of Education: N/A   Social History Main Topics  . Smoking status: Current Every Day Smoker  . Smokeless tobacco: Not on file  . Alcohol Use: Yes  . Drug Use: Yes    Special: Marijuana  . Sexual Activity: Not on file   Other Topics Concern  . None   Social History Narrative   Additional Social History:    Pain Medications: SEE MAR Prescriptions: SEE MAR Over the Counter: SEE MAR History of alcohol / drug use?: No history of alcohol / drug abuse                     Allergies:   Allergies  Allergen Reactions  . Contrast Media [Iodinated Diagnostic Agents] Other (See Comments)    Unknown   . Penicillins Other (See Comments)    unknown    Labs:  Results for orders placed or performed during the hospital encounter of 08/11/14 (from the past 48 hour(s))  Acetaminophen level     Status: Abnormal   Collection Time: 08/11/14  5:37 PM  Result Value Ref Range   Acetaminophen (Tylenol), Serum <10.0 (L) 10 - 30 ug/mL    Comment:        THERAPEUTIC CONCENTRATIONS VARY SIGNIFICANTLY.  A RANGE OF 10-30 ug/mL MAY BE AN EFFECTIVE CONCENTRATION FOR MANY PATIENTS. HOWEVER, SOME ARE BEST TREATED AT CONCENTRATIONS OUTSIDE THIS RANGE. ACETAMINOPHEN CONCENTRATIONS >150 ug/mL AT 4 HOURS AFTER INGESTION AND >50 ug/mL AT 12 HOURS AFTER INGESTION ARE OFTEN ASSOCIATED WITH TOXIC REACTIONS.   CBC     Status: None   Collection Time: 08/11/14  5:37 PM  Result Value Ref Range   WBC 7.3 4.0 - 10.5 K/uL   RBC 5.49 4.22 - 5.81 MIL/uL   Hemoglobin 15.9 13.0 - 17.0 g/dL   HCT 50.4 39.0 - 52.0 %   MCV 91.8 78.0 - 100.0 fL   MCH 29.0 26.0 - 34.0 pg   MCHC 31.5 30.0 - 36.0 g/dL   RDW 14.2 11.5 - 15.5 %   Platelets 196 150 - 400 K/uL  Comprehensive metabolic panel     Status: None   Collection Time:  08/11/14  5:37 PM  Result Value Ref Range   Sodium 136 135 - 145 mmol/L   Potassium 3.9 3.5 - 5.1 mmol/L   Chloride 100 96 - 112 mmol/L   CO2 30 19 - 32 mmol/L   Glucose, Bld 85 70 - 99 mg/dL   BUN 9 6 - 23 mg/dL   Creatinine, Ser 1.01 0.50 - 1.35 mg/dL   Calcium 8.5 8.4 - 10.5 mg/dL   Total Protein 7.5 6.0 - 8.3 g/dL   Albumin 4.1 3.5 - 5.2 g/dL   AST 20 0 - 37 U/L   ALT 19 0 - 53 U/L   Alkaline Phosphatase 87 39 - 117 U/L   Total Bilirubin 0.4 0.3 - 1.2 mg/dL   GFR calc non Af Amer >90 >90 mL/min   GFR calc Af Amer >90 >90 mL/min    Comment: (NOTE) The eGFR has been calculated using the CKD EPI equation. This calculation has not been validated in all clinical situations. eGFR's persistently <90 mL/min signify possible Chronic Kidney Disease.    Anion gap 6 5 - 15  Ethanol (ETOH)     Status: None   Collection Time: 08/11/14  5:37 PM  Result Value Ref Range   Alcohol, Ethyl (B) <5 0 - 9 mg/dL    Comment:        LOWEST DETECTABLE LIMIT FOR SERUM ALCOHOL IS 11 mg/dL FOR MEDICAL PURPOSES ONLY   Salicylate level     Status: None   Collection Time: 08/11/14  5:37 PM  Result Value Ref Range   Salicylate Lvl <3.6 2.8 - 20.0 mg/dL  Urine Drug Screen     Status: Abnormal   Collection Time: 08/11/14  6:12 PM  Result Value Ref Range   Opiates NONE DETECTED NONE DETECTED   Cocaine NONE DETECTED NONE DETECTED   Benzodiazepines NONE DETECTED NONE DETECTED   Amphetamines NONE DETECTED NONE DETECTED   Tetrahydrocannabinol POSITIVE (A) NONE DETECTED   Barbiturates NONE DETECTED NONE DETECTED    Comment:        DRUG SCREEN FOR MEDICAL PURPOSES ONLY.  IF CONFIRMATION IS NEEDED FOR ANY PURPOSE, NOTIFY LAB WITHIN 5 DAYS.        LOWEST DETECTABLE LIMITS FOR URINE DRUG SCREEN Drug Class       Cutoff (ng/mL) Amphetamine      1000 Barbiturate      200 Benzodiazepine   144 Tricyclics       315 Opiates          300 Cocaine          300 THC  50     Vitals: Blood  pressure 155/93, pulse 88, temperature 98.2 F (36.8 C), temperature source Oral, resp. rate 20, SpO2 95 %.  Risk to Self: Suicidal Ideation: Yes-Currently Present Suicidal Intent: Yes-Currently Present Is patient at risk for suicide?: Yes Suicidal Plan?: Yes-Currently Present Specify Current Suicidal Plan:  (jump off bridge, cut wrist, death by police) Access to Means: Yes Specify Access to Suicidal Means:  (bridge, sharp objects, etc. ) What has been your use of drugs/alcohol within the last 12 months?:  (none reported ) How many times?:  (1x at age 76-overdose and drank alcohol ) Other Self Harm Risks:  (none reported ) Triggers for Past Attempts: Other (Comment) (depression ) Intentional Self Injurious Behavior: None Risk to Others: Homicidal Ideation: No Thoughts of Harm to Others: No Current Homicidal Intent: No Current Homicidal Plan: No Access to Homicidal Means: No Identified Victim:  (n/a) History of harm to others?: No Assessment of Violence: None Noted Violent Behavior Description:  (n/a) Does patient have access to weapons?: No Criminal Charges Pending?: No Does patient have a court date: No Prior Inpatient Therapy: Prior Inpatient Therapy: No Prior Therapy Dates:  (n/a) Prior Therapy Facilty/Provider(s):  (n/a) Reason for Treatment:  (n/a) Prior Outpatient Therapy: Prior Outpatient Therapy: No Prior Therapy Dates:  (n/a) Prior Therapy Facilty/Provider(s):  (n/a) Reason for Treatment:  (n/a)  Current Facility-Administered Medications  Medication Dose Route Frequency Provider Last Rate Last Dose  . acetaminophen (TYLENOL) tablet 650 mg  650 mg Oral Q4H PRN Waynetta Pean, PA-C      . alum & mag hydroxide-simeth (MAALOX/MYLANTA) 200-200-20 MG/5ML suspension 30 mL  30 mL Oral PRN Waynetta Pean, PA-C      . diazepam (VALIUM) tablet 5 mg  5 mg Oral BID Waynetta Pean, PA-C   5 mg at 08/12/14 1008  . meloxicam (MOBIC) tablet 7.5 mg  7.5 mg Oral Daily Waynetta Pean,  PA-C   7.5 mg at 08/12/14 1008  . ondansetron (ZOFRAN) tablet 4 mg  4 mg Oral Q8H PRN Waynetta Pean, PA-C       Current Outpatient Prescriptions  Medication Sig Dispense Refill  . cephALEXin (KEFLEX) 500 MG capsule Take 1 capsule (500 mg total) by mouth 4 (four) times daily. (Patient not taking: Reported on 05/03/2014) 40 capsule 0  . cyclobenzaprine (FLEXERIL) 10 MG tablet Take 1 tablet (10 mg total) by mouth 2 (two) times daily as needed for muscle spasms. (Patient not taking: Reported on 08/11/2014) 20 tablet 0  . diazepam (VALIUM) 5 MG tablet Take 1 tablet (5 mg total) by mouth 2 (two) times daily. (Patient not taking: Reported on 08/11/2014) 10 tablet 0  . HYDROcodone-acetaminophen (NORCO/VICODIN) 5-325 MG per tablet Take 1 tablet by mouth every 6 (six) hours as needed. (Patient not taking: Reported on 08/11/2014) 10 tablet 0  . meloxicam (MOBIC) 7.5 MG tablet Take 1 tablet (7.5 mg total) by mouth daily. (Patient not taking: Reported on 08/11/2014) 30 tablet 0  . oxyCODONE-acetaminophen (PERCOCET/ROXICET) 5-325 MG per tablet Take 1-2 tablets by mouth every 4 (four) hours as needed for severe pain. (Patient not taking: Reported on 08/11/2014) 15 tablet 0  . sulfamethoxazole-trimethoprim (SEPTRA DS) 800-160 MG per tablet Take 1 tablet by mouth every 12 (twelve) hours. (Patient not taking: Reported on 05/03/2014) 20 tablet 0    Musculoskeletal: Strength & Muscle Tone: within normal limits Gait & Station: normal Patient leans: N/A  Psychiatric Specialty Exam:     Blood pressure 155/93, pulse 88, temperature 98.2 F (  36.8 C), temperature source Oral, resp. rate 20, SpO2 95 %.There is no weight on file to calculate BMI.  General Appearance: Casual and Fairly Groomed  Eye Contact::  Good  Speech:  Clear and Coherent and Normal Rate  Volume:  Normal  Mood:  Angry and Depressed  Affect:  Congruent and Depressed  Thought Process:  Coherent, Goal Directed and Intact  Orientation:  Full (Time, Place,  and Person)  Thought Content:  WDL  Suicidal Thoughts:  No  Homicidal Thoughts:  No  Memory:  Immediate;   Good Recent;   Good Remote;   Good  Judgement:  Fair  Insight:  Fair  Psychomotor Activity:  Normal  Concentration:  Good  Recall:  NA  Fund of Knowledge:Fair  Language: Good  Akathisia:  NA  Handed:  Right  AIMS (if indicated):     Assets:  Desire for Improvement Financial Resources/Insurance Housing  ADL's:  Intact  Cognition: WNL  Sleep:      Medical Decision Making: Established Problem, Stable/Improving (1)  Treatment Plan Summary: Plan Discharge home to see staff at [facility] [place].  Plan:  Discharge Disposition: see above  Delfin Gant    PMHNP-BC 08/12/2014 12:19 PM Patient seen face-to-face for psychiatric evaluation, chart reviewed and case discussed with the physician extender and developed treatment plan. Reviewed the information documented and agree with the treatment plan. Corena Pilgrim, MD

## 2014-08-12 NOTE — ED Notes (Signed)
Calm and cooperative.  Acuity low,

## 2014-10-11 ENCOUNTER — Encounter (HOSPITAL_COMMUNITY): Payer: Self-pay | Admitting: *Deleted

## 2014-10-11 ENCOUNTER — Emergency Department (HOSPITAL_COMMUNITY)
Admission: EM | Admit: 2014-10-11 | Discharge: 2014-10-11 | Disposition: A | Discharge status: Home / Self Care | Payer: Self-pay | Attending: Emergency Medicine | Admitting: Emergency Medicine

## 2014-10-11 DIAGNOSIS — K0889 Other specified disorders of teeth and supporting structures: Secondary | ICD-10-CM

## 2014-10-11 DIAGNOSIS — Z720 Tobacco use: Secondary | ICD-10-CM | POA: Insufficient documentation

## 2014-10-11 DIAGNOSIS — Z880 Allergy status to penicillin: Secondary | ICD-10-CM | POA: Insufficient documentation

## 2014-10-11 DIAGNOSIS — Z8669 Personal history of other diseases of the nervous system and sense organs: Secondary | ICD-10-CM | POA: Insufficient documentation

## 2014-10-11 DIAGNOSIS — K088 Other specified disorders of teeth and supporting structures: Secondary | ICD-10-CM | POA: Insufficient documentation

## 2014-10-11 DIAGNOSIS — I10 Essential (primary) hypertension: Secondary | ICD-10-CM | POA: Insufficient documentation

## 2014-10-11 MED ORDER — HYDROCODONE-ACETAMINOPHEN 5-325 MG PO TABS: 2.0000 | ORAL_TABLET | ORAL | Status: DC | PRN

## 2014-10-11 MED ORDER — CLINDAMYCIN PHOSPHATE 900 MG/50ML IV SOLN
900.0000 mg | Freq: Once | INTRAVENOUS | Status: AC
  Administered 2014-10-11: 900 mg via INTRAVENOUS
  Filled 2014-10-11: qty 50

## 2014-10-11 MED ORDER — CLINDAMYCIN HCL 150 MG PO CAPS: 150.0000 mg | ORAL_CAPSULE | Freq: Four times a day (QID) | ORAL | Status: DC

## 2014-10-11 NOTE — ED Notes (Signed)
Pt states tht he has had tooth pain for 3 weeks. Pt states that he has been seen for the same and has a followup appt in July but his pain medication is not working.

## 2014-10-11 NOTE — ED Notes (Signed)
Declined W/C at D/C and was escorted to lobby by RN. 

## 2014-10-11 NOTE — Discharge Instructions (Signed)
Dental Pain °A tooth ache may be caused by cavities (tooth decay). Cavities expose the nerve of the tooth to air and hot or cold temperatures. It may come from an infection or abscess (also called a boil or furuncle) around your tooth. It is also often caused by dental caries (tooth decay). This causes the pain you are having. °DIAGNOSIS  °Your caregiver can diagnose this problem by exam. °TREATMENT  °· If caused by an infection, it may be treated with medications which kill germs (antibiotics) and pain medications as prescribed by your caregiver. Take medications as directed. °· Only take over-the-counter or prescription medicines for pain, discomfort, or fever as directed by your caregiver. °· Whether the tooth ache today is caused by infection or dental disease, you should see your dentist as soon as possible for further care. °SEEK MEDICAL CARE IF: °The exam and treatment you received today has been provided on an emergency basis only. This is not a substitute for complete medical or dental care. If your problem worsens or new problems (symptoms) appear, and you are unable to meet with your dentist, call or return to this location. °SEEK IMMEDIATE MEDICAL CARE IF:  °· You have a fever. °· You develop redness and swelling of your face, jaw, or neck. °· You are unable to open your mouth. °· You have severe pain uncontrolled by pain medicine. °MAKE SURE YOU:  °· Understand these instructions. °· Will watch your condition. °· Will get help right away if you are not doing well or get worse. °Document Released: 04/11/2005 Document Revised: 07/04/2011 Document Reviewed: 11/28/2007 °ExitCare® Patient Information ©2015 [employer]. This information is not intended to replace advice given to you by your health care provider. Make sure you discuss any questions you have with your health care provider. ° °Emergency Department Resource Guide °1) Find a Doctor and Pay Out of Pocket °Although you won't have to find out who  is covered by your insurance plan, it is a good idea to ask around and get recommendations. You will then need to call the office and see if the doctor you have chosen will accept you as a new patient and what types of options they offer for patients who are self-pay. Some doctors offer discounts or will set up payment plans for their patients who do not have insurance, but you will need to ask so you aren't surprised when you get to your appointment. ° °2) Contact Your Local Health Department °Not all health departments have doctors that can see patients for sick visits, but many do, so it is worth a call to see if yours does. If you don't know where your local health department is, you can check in your phone book. The CDC also has a tool to help you locate your state's health department, and many state websites also have listings of all of their local health departments. ° °3) Find a Walk-in Clinic °If your illness is not likely to be very severe or complicated, you may want to try a walk in clinic. These are popping up all over the country in pharmacies, drugstores, and shopping centers. They're usually staffed by nurse practitioners or physician assistants that have been trained to treat common illnesses and complaints. They're usually fairly quick and inexpensive. However, if you have serious medical issues or chronic medical problems, these are probably not your best option. ° °No Primary Care Doctor: °- Call [facility] at  832-8000 - they can help you locate a primary   care doctor that  accepts your insurance, provides certain services, etc. °- Physician Referral Service- 1-800-533-3463 ° °Chronic Pain Problems: °Organization         Address  Phone   Notes  °[facility]  (336) 297-2271 Patients need to be referred by their primary care doctor.  ° °Medication Assistance: °Organization         Address  Phone   Notes  °[place] [facility] [address]  [address] °[place], [place] [address] (336) 641-8030 --Must be a resident of [place] °-- Must have NO insurance coverage whatsoever (no Medicaid/ Medicare, etc.) °-- The pt. MUST have a primary care doctor that directs their care regularly and follows them in the community °  °[facility]  (866) 331-1348   °[employer]  (888) 892-1162   ° °Agencies that provide inexpensive medical care: °Organization         Address  Phone   Notes  °[facility]  (336) 832-8035   °[facility]    (336) 832-7272   °[facility] [address] °[place], [place] [address] (336) 832-4777   °[facility] [address], °[place] (336) 271-4999   °[facility]    (336) 373-0678   °[facility]    (336) 272-1050   °[facility] ° [address], [place] Phone:  (336) 832-4444, Fax:  (336) 832-4440 Hours of Operation:  9 am - 6 pm, M-F.  Also accepts Medicaid/Medicare and self-pay.  °[facility] ° [address], [place] Phone: (336) 832-3150, Fax: (336) 832-3151. Hours of Operation:  8:30 am - 5:30 pm, M-F.  Also accepts Medicaid and self-pay.  °[facility] [place] [address], [place] Phone: (336) 878-6027   °[facility] [address], [place], [place] (336)723-1848, Ext. 123 Mondays & Thursdays: 7-9 AM.  First 15 patients are seen on a first come, first serve basis. °  ° °Medicaid-accepting [place] Providers: ° °Organization         Address  Phone   Notes  °[facility] [address], [place] (336) 641-2100 Also accepts self-pay patients.  °[facility] [address], [place] ° (336) 856-9996   °[facility] [address], [place] (336) 288-8857   °[facility] [address] [place] [address], [place] (336) 299-7000   °Veita Bland [address], [place]  ° (336) 373-1557 Only accepts  Access Medicaid patients after they have their name applied to their card.  ° °Self-Pay (no insurance) in [place]: ° °Organization         Address  Phone   Notes  °Sickle Cell Patients, [facility] [address], [place] (336) 832-1970   °[facility] [address], [place] (336) 832-4400   °[facility] ° [address], [place] (336) 992-4800   °[facility]/Dr. Osei-Bonsu ° [address] [place] [address], [place] or [address], [place] (336) 841-8500 Phone number for both [place] and [place] locations is the same.  °[facility] [address], [place] (336) 299-0000   °[facility] [address] [place] [address], [place] or [address] (336) 852-7530 °(336) 878-2260   °  [facility] [address], [place] (336) 350-1642, phone; (336) 294-5005, fax Sees patients 1st and 3rd Saturday of every month.  Must not qualify for public or private insurance (i.e. Medicaid, Medicare, [place] Health Choice, Veterans' Benefits) • Household income should be no more than 200% of the poverty level •The clinic cannot treat you if you are pregnant or think you are pregnant • Sexually transmitted diseases are not treated at the clinic.  ° ° °Dental Care: °Organization         Address  Phone  Notes  °[facility] [address], [place] (336) 641-6152 Accepts children up to age 21 who are enrolled in Medicaid or [place] Health Choice; pregnant women with a Medicaid card; and children who have applied for Medicaid or [place] Health Choice, but were declined, whose parents can pay a reduced fee at time of service.  °[facility] [place]  [address], [place] (336) 641-7733 Accepts children up to age 21 who are  enrolled in Medicaid or [place] Health Choice; pregnant women with a Medicaid card; and children who have applied for Medicaid or [place] Health Choice, but were declined, whose parents can pay a reduced fee at time of service.  °[facility] ° [address], [place] (336) 641-4533 Patients are seen by appointment only. Walk-ins are not accepted. [facility] will see patients 18 years of age and older. °Monday - Tuesday (8am-5pm) °Most Wednesdays (8:30-5pm) °$30 per visit, cash only  °[facility] ° [address], [place] (336) 641-4533 Patients are seen by appointment only. Walk-ins are not accepted. [facility] will see patients 18 years of age and older. °One Wednesday Evening (Monthly: Volunteer Based).  $30 per visit, cash only  °[facility]  (919) 537-3737 for adults; Children under age 4, call Graduate Pediatric Dentistry at (919) [phone]. Children aged 4-14, please call (919) [phone] to request a pediatric application. ° Dental services are provided in all areas of dental care including fillings, crowns and bridges, complete and partial dentures, implants, gum treatment, root canals, and extractions. Preventive care is also provided. Treatment is provided to both adults and children. °Patients are selected via a lottery and there is often a waiting list. °  °[facility] [address], ° ° (336) 763-8833 [email or web address] °  °[facility] [address], [place], [place] (336)723-1848, Ext. 123 Second and Fourth Thursday of each month, opens at 6:30 AM; Clinic ends at 9 AM.  Patients are seen on a first-come first-served basis, and a limited number are seen during each clinic.  ° °[facility] ° [address], [place], [place] (336) 723-7904   Eligibility Requirements °You must have lived in [place], [place], or [place] counties for at least the last three months. °  You  cannot be eligible for state or federal sponsored healthcare insurance, including Veterans Administration, Medicaid, or Medicare. °  You generally cannot be eligible for healthcare insurance through your employer.  °  How to apply: °Eligibility screenings are held every Tuesday and Wednesday afternoon from 1:00 pm until 4:00 pm. You do not need an appointment for the interview!  °[facility] [address], [place], [place] 336-631-2330   °[facility]  336-342-8273   °[facility]  336-703-3100   °[facility]  336-570-6415   ° °Behavioral Health Resources in the Community: °Intensive Outpatient Programs °Organization         Address  Phone  Notes  °[facility] [address], [place], [place] 336-878-6098   °[facility] [address], [place], [place] 336-832-9800   °[facility] [address], [place], [place] ° 336-882-2125   °[facility] [address],  °[place], [place] 1-800-853-5163 or 336-641-4981   °Substance Abuse Resources °Organization         Address  Phone  Notes  °[facility]  336-882-2125   °[facility]  336-784-9470   °[facility]  336-285-9073   °[facility]  336-845-3988   °Residential & Outpatient Substance Abuse Program  1-800-659-3381   °Psychological Services °Organization         Address  Phone  Notes  °[facility]  336- 832-9600   °[facility]  336- 378-7881   °[facility] [address], [place] 1-800-853-5163 or 336-641-4981   ° °Mobile Crisis Teams °Organization         Address  Phone  Notes  °[facility], Mobile Crisis Care Unit  1-877-626-1772   °Assertive °[facility] ° [address] [place], [place] 336-834-9664   °Sharon DeEsch [address] °[place] 336-554-5454   ° °Self-Help/Support  Groups °Organization         Address  Phone             Notes  °[facility] - variety of support groups  336- 373-1402 Call for more information  °Narcotics Anonymous (NA), [facility] [address], °[place]  2 meetings at this location  ° °Residential Treatment Programs °Organization         Address  Phone  Notes  °[facility] [address],    °[place]  1-866-801-8205   °[facility] ° [address], [place], [place] 704-293-8524   °[facility] [address], [place] 336-845-3988 Admissions: 8am-3pm M-F  °[facility] [address]    °[place], [place] 336-841-1104   °[facility] [address], [place], [place] 336-379-7146   °[facility] [address]  °[place], [place] 336-285-9073   °[facility] - Intensive Outpatient [address], [place], [place] 336-852-3033   °[facility] [address]  °[place], [place] 1-877-615-2722 or 336-784-9470   °[facility] [address] [place], [place] 336-227-7417 Accepts Medicaid  °[facility] [address]  °[place] 1-800-659-3381 Substance Abuse/Addiction Treatment  ° °[place] Behavioral Health Resources °Organization         Address  Phone  Notes  °[facility]  (888) 581-9988   °Julie Brannon, PhD [address] [place], [place]   (336) 349-5553 or (336) 951-0000   °[facility]   [address] °[place], [place] (336) 349-4454   °[facility] [address], [place], [place] (336) 342-8316 Insurance/Medicaid/sponsorship through [facility]  °[facility] [address]                                    [place], [place] (336) 342-8316 Therapy/tele-psych/case  °[facility]   [address]  ° [place], [place] (336) 349-2233    °Dr. Arfeen  (336) 349-4544   °[facility] [place]  [employer] [facility] [place] [facility] 1) [address], [place] °2) [address], [place] °3)  [address], [place] (336) 349-3220 °(336) 342-7768 ° °(336) 342-8140   °[place] [facility] (336) 342-1394 or (336) 342-3537 (After Hours)    ° ° ° °

## 2014-10-11 NOTE — ED Notes (Signed)
Pt is currently taking clindamycin 150mg  for infection. Pt reports Pain has not been controlled with Mobic and Naproxen . Pt is requesting stronger pain med.

## 2014-10-11 NOTE — ED Provider Notes (Signed)
CSN: [id number]     Arrival date & time 10/11/14  1346 History  This chart was scribed for non-physician practitioner, Ok Edwards, PA-C  working with Rolland Porter, MD by Freida Busman, ED Scribe. This patient was seen in room TR10C/TR10C and the patient's care was started at 3:19 PM.    Chief Complaint  Patient presents with  . Dental Pain    The history is provided by the patient. No language interpreter was used.     HPI Comments:  Kenneth Mcfarland is a 34 y.o. male who presents to the Emergency Department complaining of constant left lower dental pain for 3 weeks. His pain has progressively worsened since onset and is exacerbated when speaking. He reports associated mild trouble swallowing due to pain. He denies trouble breathing, fever and lightheadedness. Pt is currently on clindamycin; he has been compliant with dose and denies improvement.   Past Medical History  Diagnosis Date  . Hypertension   . Sleep apnea    Past Surgical History  Procedure Laterality Date  . Tonsillectomy     No family history on file. History  Substance Use Topics  . Smoking status: Current Every Day Smoker  . Smokeless tobacco: Not on file  . Alcohol Use: Yes    Review of Systems  Constitutional: Negative for fever.  HENT: Positive for drooling and trouble swallowing.   Neurological: Negative for light-headedness.  All other systems reviewed and are negative.     Allergies  Contrast media and Penicillins  Home Medications   Prior to Admission medications   Medication Sig Start Date End Date Taking? Authorizing Provider  cyclobenzaprine (FLEXERIL) 10 MG tablet Take 1 tablet (10 mg total) by mouth 2 (two) times daily as needed for muscle spasms. Patient not taking: Reported on 08/11/2014 05/07/14   Elpidio Anis, PA-C   BP 142/77 mmHg  Pulse 79  Temp(Src) 98.7 F (37.1 C) (Oral)  Resp 18  SpO2 95% Physical Exam  Constitutional: He is oriented to person, place, and time. He  appears well-developed and well-nourished.  HENT:  Head: Normocephalic and atraumatic.  Swelling below left mandible  Dental decay left 1st molar    Eyes:  Exophthalmus   Cardiovascular: Normal rate, regular rhythm and normal heart sounds.   Pulmonary/Chest: Effort normal and breath sounds normal. No respiratory distress.  Abdominal: He exhibits no distension.  Neurological: He is alert and oriented to person, place, and time.  Skin: Skin is warm and dry.  Psychiatric: He has a normal mood and affect.  Nursing note and vitals reviewed.   ED Course  Procedures   DIAGNOSTIC STUDIES:  Oxygen Saturation is 95% on RA, normal by my interpretation.    COORDINATION OF CARE:  3:25 PM Discussed treatment plan with pt at bedside and pt agreed to plan.  Labs Review Labs Reviewed - No data to display  Imaging Review No results found.   EKG Interpretation None      MDM  Pt given Iv clindamycin   Final diagnoses:  Tooth ache    Pt given iv clindamycin    I personally performed the services in this documentation, which was scribed in my presence.  The recorded information has been reviewed and considered.   Barnet Pall.   Lonia Skinner [place], PA-C 10/11/14 1611  Richardean Canal, MD 10/13/14 [phone]

## 2014-10-13 ENCOUNTER — Telehealth (HOSPITAL_BASED_OUTPATIENT_CLINIC_OR_DEPARTMENT_OTHER): Payer: Self-pay | Admitting: Emergency Medicine

## 2015-01-25 ENCOUNTER — Encounter (HOSPITAL_COMMUNITY): Payer: Self-pay | Admitting: Nurse Practitioner

## 2015-01-25 ENCOUNTER — Emergency Department (HOSPITAL_COMMUNITY)
Admission: EM | Admit: 2015-01-25 | Discharge: 2015-01-25 | Disposition: A | Discharge status: Home / Self Care | Payer: Self-pay | Attending: Emergency Medicine | Admitting: Emergency Medicine

## 2015-01-25 ENCOUNTER — Emergency Department (HOSPITAL_COMMUNITY): Payer: Self-pay

## 2015-01-25 ENCOUNTER — Emergency Department (EMERGENCY_DEPARTMENT_HOSPITAL): Payer: Self-pay

## 2015-01-25 DIAGNOSIS — R609 Edema, unspecified: Secondary | ICD-10-CM | POA: Insufficient documentation

## 2015-01-25 DIAGNOSIS — F4310 Post-traumatic stress disorder, unspecified: Secondary | ICD-10-CM | POA: Insufficient documentation

## 2015-01-25 DIAGNOSIS — I872 Venous insufficiency (chronic) (peripheral): Secondary | ICD-10-CM | POA: Insufficient documentation

## 2015-01-25 DIAGNOSIS — G4730 Sleep apnea, unspecified: Secondary | ICD-10-CM | POA: Insufficient documentation

## 2015-01-25 DIAGNOSIS — Z79899 Other long term (current) drug therapy: Secondary | ICD-10-CM | POA: Insufficient documentation

## 2015-01-25 DIAGNOSIS — R197 Diarrhea, unspecified: Secondary | ICD-10-CM | POA: Insufficient documentation

## 2015-01-25 DIAGNOSIS — Z720 Tobacco use: Secondary | ICD-10-CM | POA: Insufficient documentation

## 2015-01-25 DIAGNOSIS — I8310 Varicose veins of unspecified lower extremity with inflammation: Secondary | ICD-10-CM

## 2015-01-25 DIAGNOSIS — I10 Essential (primary) hypertension: Secondary | ICD-10-CM | POA: Insufficient documentation

## 2015-01-25 DIAGNOSIS — Z880 Allergy status to penicillin: Secondary | ICD-10-CM | POA: Insufficient documentation

## 2015-01-25 DIAGNOSIS — F319 Bipolar disorder, unspecified: Secondary | ICD-10-CM | POA: Insufficient documentation

## 2015-01-25 DIAGNOSIS — M79609 Pain in unspecified limb: Secondary | ICD-10-CM

## 2015-01-25 DIAGNOSIS — Z791 Long term (current) use of non-steroidal anti-inflammatories (NSAID): Secondary | ICD-10-CM | POA: Insufficient documentation

## 2015-01-25 DIAGNOSIS — M7989 Other specified soft tissue disorders: Secondary | ICD-10-CM

## 2015-01-25 DIAGNOSIS — R062 Wheezing: Secondary | ICD-10-CM | POA: Insufficient documentation

## 2015-01-25 DIAGNOSIS — R600 Localized edema: Secondary | ICD-10-CM

## 2015-01-25 HISTORY — DX: Bipolar disorder, unspecified: F31.9

## 2015-01-25 HISTORY — DX: Narcolepsy without cataplexy: G47.419

## 2015-01-25 HISTORY — DX: Post-traumatic stress disorder, unspecified: F43.10

## 2015-01-25 LAB — CBC WITH DIFFERENTIAL/PLATELET
Basophils Absolute: 0 10*3/uL (ref 0.0–0.1)
Basophils Relative: 1 %
EOS ABS: 0.2 10*3/uL (ref 0.0–0.7)
Eosinophils Relative: 4 %
HEMATOCRIT: 49.5 % (ref 39.0–52.0)
HEMOGLOBIN: 15.7 g/dL (ref 13.0–17.0)
LYMPHS ABS: 1.7 10*3/uL (ref 0.7–4.0)
Lymphocytes Relative: 32 %
MCH: 29.7 pg (ref 26.0–34.0)
MCHC: 31.7 g/dL (ref 30.0–36.0)
MCV: 93.8 fL (ref 78.0–100.0)
Monocytes Absolute: 0.5 10*3/uL (ref 0.1–1.0)
Monocytes Relative: 9 %
NEUTROS ABS: 2.9 10*3/uL (ref 1.7–7.7)
NEUTROS PCT: 54 %
Platelets: 185 10*3/uL (ref 150–400)
RBC: 5.28 MIL/uL (ref 4.22–5.81)
RDW: 14.6 % (ref 11.5–15.5)
WBC: 5.4 10*3/uL (ref 4.0–10.5)

## 2015-01-25 LAB — I-STAT ARTERIAL BLOOD GAS, ED
ACID-BASE EXCESS: 4 mmol/L — AB (ref 0.0–2.0)
BICARBONATE: 30.8 meq/L — AB (ref 20.0–24.0)
O2 Saturation: 93 %
TCO2: 33 mmol/L (ref 0–100)
pCO2 arterial: 55.8 mmHg — ABNORMAL HIGH (ref 35.0–45.0)
pH, Arterial: 7.351 (ref 7.350–7.450)
pO2, Arterial: 74 mmHg — ABNORMAL LOW (ref 80.0–100.0)

## 2015-01-25 LAB — COMPREHENSIVE METABOLIC PANEL
ALK PHOS: 77 U/L (ref 38–126)
ALT: 22 U/L (ref 17–63)
AST: 32 U/L (ref 15–41)
Albumin: 3.4 g/dL — ABNORMAL LOW (ref 3.5–5.0)
Anion gap: 6 (ref 5–15)
BUN: 9 mg/dL (ref 6–20)
CALCIUM: 9.2 mg/dL (ref 8.9–10.3)
CO2: 31 mmol/L (ref 22–32)
Chloride: 104 mmol/L (ref 101–111)
Creatinine, Ser: 1.2 mg/dL (ref 0.61–1.24)
GFR calc non Af Amer: >60 mL/min (ref >60)
Glucose, Bld: 113 mg/dL — ABNORMAL HIGH (ref 65–99)
Potassium: 4.4 mmol/L (ref 3.5–5.1)
Sodium: 141 mmol/L (ref 135–145)
Total Bilirubin: 0.5 mg/dL (ref 0.3–1.2)
Total Protein: 6.9 g/dL (ref 6.5–8.1)

## 2015-01-25 MED ORDER — ALBUTEROL SULFATE (2.5 MG/3ML) 0.083% IN NEBU
5.0000 mg | INHALATION_SOLUTION | Freq: Once | RESPIRATORY_TRACT | Status: AC
  Administered 2015-01-25: 5 mg via RESPIRATORY_TRACT
  Filled 2015-01-25: qty 6

## 2015-01-25 MED ORDER — FUROSEMIDE 20 MG PO TABS
20.0000 mg | ORAL_TABLET | Freq: Once | ORAL | Status: AC
  Administered 2015-01-25: 20 mg via ORAL
  Filled 2015-01-25: qty 1

## 2015-01-25 MED ORDER — HYDROCODONE-ACETAMINOPHEN 5-325 MG PO TABS
2.0000 | ORAL_TABLET | Freq: Once | ORAL | Status: DC
  Filled 2015-01-25: qty 2

## 2015-01-25 MED ORDER — OXYCODONE-ACETAMINOPHEN 5-325 MG PO TABS
1.0000 | ORAL_TABLET | Freq: Once | ORAL | Status: DC
  Filled 2015-01-25: qty 1

## 2015-01-25 NOTE — ED Notes (Signed)
Ambulated pt in hallway without difficulty, pt O2 sats 89% on RA --

## 2015-01-25 NOTE — Progress Notes (Signed)
VASCULAR LAB PRELIMINARY  PRELIMINARY  PRELIMINARY  PRELIMINARY  Right lower extremity venous duplex completed.    Preliminary report - No obvious evidence of DVT, superficial thrombosis, or Baker's cyst involving the right lower extremity.  Kiri Hinderliter, RVS 01/25/2015, 2:49 PM

## 2015-01-25 NOTE — ED Provider Notes (Signed)
Please see previous provider's note regarding patient's presenting history and physical, initial ED course, and associated MDM. In short, this is a 34 y.o. male who presents with swelling involving his lower extremity. At time of sign out, work-up unremarkable. Not felt to be cellulitis. History not c/f CHF and no major risk factors. Has been on lasix in the past, but discontinued by himself. No renal dysfunction, and remainder of blood work unremarkable. 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 Has not had this worked up and patient in ED have frequent episode of falling asleep with hypoxia and abnormal breathing. Would recover when he awakens. Says this has been happening all his life and often falls asleep like this at work. Pending ABG and CXR at time of sign out. Previous provider's plan is to discharge with close follow-up if unremarkable. ABG showing chronic compensated respiratory acidosis with PCO2 of 55 without acidosis. CXR clear. Ambulated in the ED with steady gait and normal mental status. Case management will call patient tomorrow to set up close outpatient follow-up and sleep study.   Lavera Guise, MD 01/26/15 [phone]

## 2015-01-25 NOTE — ED Notes (Signed)
Pt continues to have periods of apnea with sats dropping into the 70's. Family states "he does this all night long"

## 2015-01-25 NOTE — ED Provider Notes (Addendum)
CSN: [id number]     Arrival date & time 01/25/15  1231 History   First MD Initiated Contact with Patient 01/25/15 1244     Chief Complaint  Patient presents with  . Cellulitis     (Consider location/radiation/quality/duration/timing/severity/associated sxs/prior Treatment) HPI Comments: Patient with a history of obesity, hypertension, sleep apnea, PTSD who presents with a two-week history of worsening right leg pain, swelling. Patient states approximate 2 weeks ago he ran into something causing him to scrape some skin off the medial part of his right lower leg 4 days later it started to become red and swollen. He was seen at a clinic and at that time was started on clindamycin and Bactrim. He states he's been taking the medications for approximately 1 week with improvement in the redness and heat but persistent pain and swelling that's worsened. The wound he states is draining and some skin sloughing. He has a history of asthma and uses inhaler intermittently but has not used it today and continues to smoke. He states recently his asthma has seemed to be flared up from the change in weather but denies any URI symptoms. Pain is 10 out of 10 when he attempts to walk.  The history is provided by the patient.    Past Medical History  Diagnosis Date  . Hypertension   . Sleep apnea   . Sleep apnea   . Narcolepsy   . PTSD (post-traumatic stress disorder)   . Bipolar disorder [facility])    Past Surgical History  Procedure Laterality Date  . Tonsillectomy     History reviewed. No pertinent family history. Social History  Substance Use Topics  . Smoking status: Current Every Day Smoker  . Smokeless tobacco: None  . Alcohol Use: Yes    Review of Systems  Gastrointestinal: Positive for diarrhea.       Diarrhea has started since being on the antibiotic  All other systems reviewed and are negative.     Allergies  Contrast media; Penicillins; and Vicodin  Home Medications   Prior to  Admission medications   Medication Sig Start Date End Date Taking? Authorizing Provider  citalopram (CELEXA) 40 MG tablet Take 40 mg by mouth daily.   Yes Historical Provider, MD  clindamycin (CLEOCIN) 150 MG capsule Take 1 capsule (150 mg total) by mouth every 6 (six) hours. Patient taking differently: Take 300 mg by mouth 4 (four) times daily.  10/11/14  Yes Lonia Skinner Sofia, PA-C  lisinopril-hydrochlorothiazide (PRINZIDE,ZESTORETIC) 20-12.5 MG tablet Take 1 tablet by mouth daily.   Yes Historical Provider, MD  meloxicam (MOBIC) 15 MG tablet Take 15 mg by mouth daily as needed for pain.   Yes Historical Provider, MD  Oxycodone HCl 10 MG TABS Take 10 mg by mouth every 6 (six) hours as needed (pain).   Yes Historical Provider, MD  sulfamethoxazole-trimethoprim (BACTRIM DS,SEPTRA DS) 800-160 MG tablet Take 1 tablet by mouth 2 (two) times daily.   Yes Historical Provider, MD  cyclobenzaprine (FLEXERIL) 10 MG tablet Take 1 tablet (10 mg total) by mouth 2 (two) times daily as needed for muscle spasms. Patient not taking: Reported on 08/11/2014 05/07/14   Elpidio Anis, PA-C  HYDROcodone-acetaminophen (NORCO/VICODIN) 5-325 MG per tablet Take 2 tablets by mouth every 4 (four) hours as needed. Patient not taking: Reported on 01/25/2015 10/11/14   Elson Areas, PA-C   BP 148/79 mmHg  Pulse 86  Temp(Src) 98.3 F (36.8 C) (Oral)  Resp 19  Ht  (1.753 m)  Wt 360 lb (163.295 kg)  BMI 53.14 kg/m2  SpO2 96% Physical Exam  Constitutional: He is oriented to person, place, and time. He appears well-developed and well-nourished. No distress.  Morbidly obese  HENT:  Head: Normocephalic and atraumatic.  Mouth/Throat: Oropharynx is clear and moist.  strabismus  Eyes: Conjunctivae and EOM are normal. Pupils are equal, round, and reactive to light.  Neck: Normal range of motion. Neck supple.  Cardiovascular: Normal rate, regular rhythm and intact distal pulses.   No murmur heard. Pulmonary/Chest: Effort  normal. No respiratory distress. He has wheezes. He has no rales.  Abdominal: Soft. He exhibits no distension. There is no tenderness. There is no rebound and no guarding.  Musculoskeletal: Normal range of motion. He exhibits edema and tenderness.  Pitting edema present in bilateral leg with signs of chronic venous stasis and skin changes.  Right leg more edematous than left with calf tenderness and firmness.  Small 1cm wound on the right mid medial tib/fib with no drainage or erythema.  No warmth to the right leg.  Neurological: He is alert and oriented to person, place, and time.  Skin: Skin is warm and dry. No rash noted. No erythema.  Psychiatric: He has a normal mood and affect. His behavior is normal.  Nursing note and vitals reviewed.   ED Course  Procedures (including critical care time) Labs Review Labs Reviewed  COMPREHENSIVE METABOLIC PANEL - Abnormal; Notable for the following:    Glucose, Bld 113 (*)    Albumin 3.4 (*)    All other components within normal limits  CBC WITH DIFFERENTIAL/PLATELET    Imaging Review Dg Tibia/fibula Right  01/25/2015   CLINICAL DATA:  Right lower leg pain.  EXAM: RIGHT TIBIA AND FIBULA - 2 VIEW  COMPARISON:  None.  FINDINGS: There is no evidence of fracture or other focal bone lesions. Soft tissues are unremarkable.  IMPRESSION: Negative.   Electronically Signed   By: Elige Ko   On: 01/25/2015 13:58   I have personally reviewed and evaluated these images and lab results as part of my medical decision-making.   EKG Interpretation None      MDM   Final diagnoses:  Sleep apnea  Bilateral lower extremity edema  Chronic venous stasis dermatitis, unspecified laterality  Morbid obesity, unspecified obesity type [facility])   Patient is a 34 year old obese man with a history of hypertension sleep apnea and narcolepsy who presents today with ongoing pain in the right leg that's worsened over the last 2 weeks. It also started after he hit his leg  causing an abrasion of the skin. 4 days later he noticed redness and drainage. He was started on clindamycin and Bactrim which he states he's been taking for over a week which helped with the redness and warmth but his leg continues to swell and be painful. He denies any prior history of DVT and denies a history of diabetes. He does have edema firmness and tenderness to the right lower extremity especially in the calf but no overt signs of infection. Small wound without drainage or erythema.  Low suspicion for necrotizing fasciitis. No significant signs for cellulitis.  Concern for DVT.  Secondly patient has a history of asthma and has wheezing on exam but states he has not used his inhaler continues to smoke and feels like something in the air is irritating him currently. Patient given albuterol. X-ray negative for air or abnormal bone. CBC and CMP within normal limits except for a mild elevated glucose  of 113 which was nonfasting.  Duplex without DVT.  3:36 PM On reevaluation findings discussed with the patient and his significant other. He is no longer taking Lasix because he ran out. Didn't seem most suggestive of chronic venous stasis. Patient is extremely sleepy and has obvious sleep apnea in the room. His oxygen saturation drops to 70% and then he snores and wakes up. This apparently has been getting worse for some time and he does not sleep at all during the night. He has excessive daytime drowsiness and has a hard time completing his job because it's difficult for him to stay awake.  Will place compression stockings and restart the Lasix. We'll also have social work come and speak with the patient about following up with her primary physician in getting tested for CPAP mask at home.   Chest x-ray and ABG pending and patient checked out to [facility]    Gwyneth Sprout, MD 01/25/15 1538  Gwyneth Sprout, MD 01/25/15 1539  Gwyneth Sprout, MD 01/25/15 1558

## 2015-01-25 NOTE — ED Notes (Signed)
Pt in radiology 

## 2015-01-25 NOTE — ED Notes (Addendum)
He was treated by [facility] with oral abx 2 weeks ago for RLE skin infection. He states the infection is getting worse, increasing swelling, pain, drainage from leg. He states he is hardly able to bear weight on the leg because fluid leaks out when he stands and it is painful

## 2015-01-25 NOTE — ED Notes (Signed)
Pt returned from xray, has continued episodes of snoring with gasping respirations, is only able to stay awake for a few minutes before starting to snore.

## 2015-01-25 NOTE — Care Management Note (Signed)
Case Management Note  Patient Details  Name: Kenneth Mcfarland MRN: [id number] Date of Birth: 12/14/1980  Subjective/Objective:    34 y.o. M seen in the ED for Bilateral LE edema. Observed periods of  Apnea per MD concerning as he Desaturates into the 70's. Oxygen via mask.Girlfriend at bedside reports he does not have a PCP, sleeps poorly and is often sleepy at his job during the day. Confirmed phone numbers as [phone]. Will call and get appt at [facility] on 10/3 and call pt with date and time.                 Action/Plan:will f/u with appt date and time.    Expected Discharge Date:                  Expected Discharge Plan:     In-House Referral:  PCP / [facility]  Discharge planning Services  CM Consult, Follow-up appt scheduled [facility])  Post Acute Care Choice:    Choice offered to:     DME Arranged:    DME Agency:     HH Arranged:    HH Agency:     Status of Service:  In process, will continue to follow  Medicare Important Message Given:    Date Medicare IM Given:    Medicare IM give by:    Date Additional Medicare IM Given:    Additional Medicare Important Message give by:     If discussed at Long Length of Stay Meetings, dates discussed:    Additional Comments:  Yvone Neu, RN 01/25/2015, 3:47 PM

## 2015-01-25 NOTE — Discharge Instructions (Signed)
You need a sleep study. Case management will call you for follow-up tomorrow. Return for worsening symptoms, including confusion, difficulty breathing, or any other symptoms concerning to you.  Edema Edema is an abnormal buildup of fluids. It is more common in your legs and thighs. Painless swelling of the feet and ankles is more likely as a person ages. It also is common in looser skin, like around your eyes. HOME CARE   Keep the affected body part above the level of the heart while lying down.  Do not sit still or stand for a long time.  Do not put anything right under your knees when you lie down.  Do not wear tight clothes on your upper legs.  Exercise your legs to help the puffiness (swelling) go down.  Wear elastic bandages or support stockings as told by your doctor.  A low-salt diet may help lessen the puffiness.  Only take medicine as told by your doctor. GET HELP IF:  Treatment is not working.  You have heart, liver, or kidney disease and notice that your skin looks puffy or shiny.  You have puffiness in your legs that does not get better when you raise your legs.  You have sudden weight gain for no reason. GET HELP RIGHT AWAY IF:   You have shortness of breath or chest pain.  You cannot breathe when you lie down.  You have pain, redness, or warmth in the areas that are puffy.  You have heart, liver, or kidney disease and get edema all of a sudden.  You have a fever and your symptoms get worse all of a sudden. MAKE SURE YOU:   Understand these instructions.  Will watch your condition.  Will get help right away if you are not doing well or get worse. Document Released: 09/28/2007 Document Revised: 04/16/2013 Document Reviewed: 02/01/2013 [facility] Patient Information 2015 [place], [place]. This information is not intended to replace advice given to you by your health care provider. Make sure you discuss any questions you have with your health care provider.

## 2015-01-26 ENCOUNTER — Telehealth: Payer: Self-pay | Admitting: *Deleted

## 2015-01-26 NOTE — Telephone Encounter (Signed)
NCM called to inform pt of appointment at [facility] on 10/14 at 1:45 with Julianne Handler, NP.  NCM obtained permission to text address and phone number to pt cell phone.  NCM stressed importance of keeping appointment.  Pt verbalized understanding.

## 2015-02-06 ENCOUNTER — Ambulatory Visit: Payer: Self-pay | Admitting: Family Medicine

## 2015-02-11 ENCOUNTER — Encounter (HOSPITAL_COMMUNITY): Payer: Self-pay | Admitting: Emergency Medicine

## 2015-02-11 ENCOUNTER — Emergency Department (HOSPITAL_COMMUNITY)
Admission: EM | Admit: 2015-02-11 | Discharge: 2015-02-11 | Disposition: A | Discharge status: Home / Self Care | Payer: Self-pay | Attending: Emergency Medicine | Admitting: Emergency Medicine

## 2015-02-11 DIAGNOSIS — F319 Bipolar disorder, unspecified: Secondary | ICD-10-CM | POA: Insufficient documentation

## 2015-02-11 DIAGNOSIS — Z792 Long term (current) use of antibiotics: Secondary | ICD-10-CM | POA: Insufficient documentation

## 2015-02-11 DIAGNOSIS — E669 Obesity, unspecified: Secondary | ICD-10-CM | POA: Insufficient documentation

## 2015-02-11 DIAGNOSIS — I10 Essential (primary) hypertension: Secondary | ICD-10-CM | POA: Insufficient documentation

## 2015-02-11 DIAGNOSIS — Z8669 Personal history of other diseases of the nervous system and sense organs: Secondary | ICD-10-CM | POA: Insufficient documentation

## 2015-02-11 DIAGNOSIS — Z5189 Encounter for other specified aftercare: Secondary | ICD-10-CM

## 2015-02-11 DIAGNOSIS — Z720 Tobacco use: Secondary | ICD-10-CM | POA: Insufficient documentation

## 2015-02-11 DIAGNOSIS — L988 Other specified disorders of the skin and subcutaneous tissue: Secondary | ICD-10-CM | POA: Insufficient documentation

## 2015-02-11 DIAGNOSIS — Z79899 Other long term (current) drug therapy: Secondary | ICD-10-CM | POA: Insufficient documentation

## 2015-02-11 DIAGNOSIS — Z880 Allergy status to penicillin: Secondary | ICD-10-CM | POA: Insufficient documentation

## 2015-02-11 DIAGNOSIS — R609 Edema, unspecified: Secondary | ICD-10-CM | POA: Insufficient documentation

## 2015-02-11 MED ORDER — MUPIROCIN CALCIUM 2 % EX CREA
1.0000 | TOPICAL_CREAM | Freq: Two times a day (BID) | CUTANEOUS | Status: DC
Start: 2015-02-11 — End: 2015-03-02

## 2015-02-11 MED ORDER — BACITRACIN ZINC 500 UNIT/GM EX OINT
TOPICAL_OINTMENT | Freq: Two times a day (BID) | CUTANEOUS | Status: DC
  Administered 2015-02-11: 1 via TOPICAL
  Filled 2015-02-11: qty 0.9

## 2015-02-11 NOTE — ED Provider Notes (Signed)
CSN: [id number]     Arrival date & time 02/11/15  0317 History  By signing my name below, I, Lyndel SafeKaitlyn Shelton, attest that this documentation has been prepared under the direction and in the presence of Tawny Raspberry, MD. Electronically Signed: Lyndel SafeKaitlyn Shelton, ED Scribe. 02/11/2015. 3:46 AM.  Chief Complaint  Patient presents with  . Leg Pain   Patient is a 34 y.o. male presenting with leg pain. The history is provided by the patient. No language interpreter was used.  Leg Pain Location:  Leg Injury: no   Leg location:  R leg Pain details:    Quality:  Pressure   Radiates to:  Does not radiate   Severity:  Moderate   Onset quality:  Gradual   Timing:  Constant   Progression:  Worsening Chronicity:  Chronic Dislocation: no   Foreign body present:  No foreign bodies Prior injury to area:  No Relieved by:  Immobilization Exacerbated by: standing on his feet without his compression stocking all shift. Ineffective treatments:  None tried Associated symptoms: no fever   Risk factors: obesity    HPI Comments: Kenneth DonningJamaine Mcfarland is a 34 y.o. male, with a PMhx of HTN, sleep apnea, and obesity, who presents to the Emergency Department complaining of constant, moderate serous drainage from a small wound to anterior right lower leg with associated constant, worsening pain the right lower leg. Pt reports he is on his feet for a significant amount of time at work. He does not wear compression stockings while at work. The pain and drainage are worse with ambulation and weight bearing for a significant amount of time. Denies fevers or chills. No erythema to RLE.   Past Medical History  Diagnosis Date  . Hypertension   . Sleep apnea   . Sleep apnea   . Narcolepsy   . PTSD (post-traumatic stress disorder)   . Bipolar disorder [facility](HCC)    Past Surgical History  Procedure Laterality Date  . Tonsillectomy     Family History  Problem Relation Age of Onset  . Adopted: Yes   Social History   Substance Use Topics  . Smoking status: Current Every Day Smoker  . Smokeless tobacco: None  . Alcohol Use: Yes    Review of Systems  Constitutional: Negative for fever and chills.  Respiratory: Negative for shortness of breath.   Musculoskeletal: Positive for arthralgias ( right lower leg).  Skin: Positive for wound (small serous draining wound to right shin ). Negative for color change.  All other systems reviewed and are negative.  Allergies  Contrast media; Penicillins; and Vicodin  Home Medications   Prior to Admission medications   Medication Sig Start Date End Date Taking? Authorizing Provider  citalopram (CELEXA) 40 MG tablet Take 40 mg by mouth daily.    Historical Provider, MD  clindamycin (CLEOCIN) 150 MG capsule Take 1 capsule (150 mg total) by mouth every 6 (six) hours. Patient taking differently: Take 300 mg by mouth 4 (four) times daily.  10/11/14   Elson AreasLeslie K Sofia, PA-C  cyclobenzaprine (FLEXERIL) 10 MG tablet Take 1 tablet (10 mg total) by mouth 2 (two) times daily as needed for muscle spasms. Patient not taking: Reported on 08/11/2014 05/07/14   Elpidio AnisShari Upstill, PA-C  HYDROcodone-acetaminophen (NORCO/VICODIN) 5-325 MG per tablet Take 2 tablets by mouth every 4 (four) hours as needed. Patient not taking: Reported on 01/25/2015 10/11/14   Elson AreasLeslie K Sofia, PA-C  lisinopril-hydrochlorothiazide (PRINZIDE,ZESTORETIC) 20-12.5 MG tablet Take 1 tablet by mouth daily.  Historical Provider, MD  meloxicam (MOBIC) 15 MG tablet Take 15 mg by mouth daily as needed for pain.    Historical Provider, MD  Oxycodone HCl 10 MG TABS Take 10 mg by mouth every 6 (six) hours as needed (pain).    Historical Provider, MD  sulfamethoxazole-trimethoprim (BACTRIM DS,SEPTRA DS) 800-160 MG tablet Take 1 tablet by mouth 2 (two) times daily.    Historical Provider, MD   BP 134/64 mmHg  Pulse 95  Temp(Src) 98 F (36.7 C) (Oral)  Resp 20  SpO2 89% Physical Exam  Constitutional: He is oriented to  person, place, and time. He appears well-developed and well-nourished. No distress.  HENT:  Head: Normocephalic.  Right Ear: External ear normal.  Left Ear: External ear normal.  Mouth/Throat: Oropharynx is clear and moist. No oropharyngeal exudate.  Moist mucous membranes, no exudates.   Eyes: Conjunctivae and EOM are normal. Pupils are equal, round, and reactive to light.  Neck: Normal range of motion. Neck supple. No JVD present.  No bruits, negative hepatojugular reflex.   Cardiovascular: Normal rate, regular rhythm and normal heart sounds.   No murmur heard. Pulmonary/Chest: Effort normal and breath sounds normal. No respiratory distress. He has no wheezes. He has no rales.  Abdominal: Soft. Bowel sounds are normal. He exhibits no distension and no mass. There is no tenderness. There is no rebound and no guarding.  Musculoskeletal:  Compartments soft; 6mm opening on distal right shin draining serous fluid, no warmth, no erythema, no fluctuance; DP 2+' caliber bilateral calves is symmetric, warmth is symmetric, no cords,   Neurological: He is alert and oriented to person, place, and time. He has normal reflexes. No cranial nerve deficit. Coordination normal.  Skin: No rash noted. No erythema. No pallor.  Psychiatric: He has a normal mood and affect.    ED Course  Procedures  DIAGNOSTIC STUDIES: Oxygen Saturation is 89% on RA, low by my interpretation.    COORDINATION OF CARE: 3:45 AM Discussed treatment plan with pt at bedside and pt agreed to plan.   MDM   Final diagnoses:  None    No signs or symptoms of infection. Small wound with only serous drainage.  Wound care at the bedside, keep clean and dry, rx for mupiricin ointment, given the fact that it is only serous drainage from dependent edema will prescribe compression stockings and follow up with your PMD and the wound care center as needed.    I, Latrelle Fuston-RASCH,Peggy Loge K, personally performed the services described in this  documentation. All medical record entries made by the scribe were at my direction and in my presence.  I have reviewed the chart and discharge instructions and agree that the record reflects my personal performance and is accurate and complete. Hally Colella-RASCH,Eithan Beagle K.  02/11/2015. 4:18 AM.     Cali Cuartas, MD 02/11/15 [phone]

## 2015-02-11 NOTE — ED Notes (Signed)
Patient with @6mm  wound to left shin. Patient states he initially injured his shin after walking into something. Patient states he did a cycle of antibiotics with his MD. Patient states he noticed "this morning" when he got up for work that he had an open area that was draining fluid. Fluid is serous. Legs are equally warm, no difference in temperature noted. Swelling to bilateral lower extremities, non pitting, left slightly larger than right. Patient states he is on his feet at work the majority of the time he is there. Patient ambulatory without difficulty to restroom visualized by this nurse.

## 2015-02-11 NOTE — ED Notes (Signed)
MD at bedside. 

## 2015-02-11 NOTE — ED Notes (Signed)
Patient requesting pain medication. After speaking to MD patient was encouraged to take ibuprofen OTC, following the directions on the bottle. Suggested patient speak to his PCP if he feels like he needs something stronger. D/C instructions reviewed with family at bedside as patient is not able to stay awake for discharge teaching.

## 2015-02-11 NOTE — ED Notes (Signed)
Pt states he has inflammation of his right ankle and is having weeping from his lower leg  Pt states it is painful to walk on

## 2015-02-24 ENCOUNTER — Ambulatory Visit (INDEPENDENT_AMBULATORY_CARE_PROVIDER_SITE_OTHER): Payer: BLUE CROSS/BLUE SHIELD | Admitting: Family Medicine

## 2015-02-24 VITALS — BP 116/80 | HR 96 | Temp 98.2°F | Resp 20 | Ht 69.0 in | Wt 364.0 lb

## 2015-02-24 DIAGNOSIS — Z0000 Encounter for general adult medical examination without abnormal findings: Secondary | ICD-10-CM | POA: Diagnosis not present

## 2015-02-24 DIAGNOSIS — Z13220 Encounter for screening for lipoid disorders: Secondary | ICD-10-CM | POA: Diagnosis not present

## 2015-02-24 DIAGNOSIS — G4733 Obstructive sleep apnea (adult) (pediatric): Secondary | ICD-10-CM | POA: Diagnosis not present

## 2015-02-24 DIAGNOSIS — G47419 Narcolepsy without cataplexy: Secondary | ICD-10-CM

## 2015-02-24 DIAGNOSIS — H0520 Unspecified exophthalmos: Secondary | ICD-10-CM | POA: Diagnosis not present

## 2015-02-24 DIAGNOSIS — Z720 Tobacco use: Secondary | ICD-10-CM

## 2015-02-24 DIAGNOSIS — B370 Candidal stomatitis: Secondary | ICD-10-CM

## 2015-02-24 DIAGNOSIS — Z113 Encounter for screening for infections with a predominantly sexual mode of transmission: Secondary | ICD-10-CM

## 2015-02-24 DIAGNOSIS — Z131 Encounter for screening for diabetes mellitus: Secondary | ICD-10-CM

## 2015-02-24 DIAGNOSIS — E1142 Type 2 diabetes mellitus with diabetic polyneuropathy: Secondary | ICD-10-CM

## 2015-02-24 DIAGNOSIS — I10 Essential (primary) hypertension: Secondary | ICD-10-CM | POA: Diagnosis not present

## 2015-02-24 DIAGNOSIS — Z130 Encounter for screening for diseases of the blood and blood-forming organs and certain disorders involving the immune mechanism: Secondary | ICD-10-CM

## 2015-02-24 DIAGNOSIS — Z1329 Encounter for screening for other suspected endocrine disorder: Secondary | ICD-10-CM | POA: Diagnosis not present

## 2015-02-24 DIAGNOSIS — Z2821 Immunization not carried out because of patient refusal: Secondary | ICD-10-CM

## 2015-02-24 LAB — POCT CBC
Granulocyte percent: 60.4 % (ref 37–80)
HCT, POC: 51.2 % (ref 43.5–53.7)
Hemoglobin: 16.6 g/dL (ref 14.1–18.1)
Lymph, poc: 2.3 (ref 0.6–3.4)
MCH, POC: 29.2 pg (ref 27–31.2)
MCHC: 32.4 g/dL (ref 31.8–35.4)
MCV: 90.1 fL (ref 80–97)
MID (cbc): 0.4 (ref 0–0.9)
MPV: 7.9 fL (ref 0–99.8)
POC Granulocyte: 4.1 (ref 2–6.9)
POC LYMPH PERCENT: 33.3 % (ref 10–50)
POC MID %: 6.3 %M (ref 0–12)
Platelet Count, POC: 161 10*3/uL (ref 142–424)
RBC: 5.69 M/uL (ref 4.69–6.13)
RDW, POC: 15.1 %
WBC: 6.8 10*3/uL (ref 4.6–10.2)

## 2015-02-24 LAB — POCT GLYCOSYLATED HEMOGLOBIN (HGB A1C): Hemoglobin A1C: 7

## 2015-02-24 LAB — HEMOGLOBIN A1C: HEMOGLOBIN A1C: 7 % — AB (ref 4.0–6.0)

## 2015-02-24 MED ORDER — LISINOPRIL 5 MG PO TABS: 5.0000 mg | ORAL_TABLET | Freq: Every day | ORAL | Status: DC

## 2015-02-24 MED ORDER — METFORMIN HCL 500 MG PO TABS: 500.0000 mg | ORAL_TABLET | Freq: Two times a day (BID) | ORAL | Status: DC

## 2015-02-24 MED ORDER — NYSTATIN 100000 UNIT/ML MT SUSP: 5.0000 mL | Freq: Four times a day (QID) | OROMUCOSAL | Status: DC

## 2015-02-24 MED ORDER — FUROSEMIDE 20 MG PO TABS: 20.0000 mg | ORAL_TABLET | Freq: Two times a day (BID) | ORAL | Status: DC

## 2015-02-24 NOTE — Patient Instructions (Signed)
Recommend : ADA diet, BP goal <140/90, daily foot exams, tobacco cessation if smoking, annual eye exam, annual flu vaccine, PNA vaccine if age and time appropriate.   Diabetes and Standards of Medical Care Diabetes is complicated. You may find that your diabetes team includes a dietitian, nurse, diabetes educator, eye doctor, and more. To help everyone know what is going on and to help you get the care you deserve, the following schedule of care was developed to help keep you on track. Below are the tests, exams, vaccines, medicines, education, and plans you will need. HbA1c test This test shows how well you have controlled your glucose over the past 2-3 months. It is used to see if your diabetes management plan needs to be adjusted.   It is performed at least 2 times a year if you are meeting treatment goals.  It is performed 4 times a year if therapy has changed or if you are not meeting treatment goals. Blood pressure test  This test is performed at every routine medical visit. The goal is less than 140/90 mm Hg for most people, but 130/80 mm Hg in some cases. Ask your health care provider about your goal. Dental exam  Follow up with the dentist regularly. Eye exam  If you are diagnosed with type 1 diabetes as a child, get an exam upon reaching the age of 69 years or older and having had diabetes for 3-5 years. Yearly eye exams are recommended after that initial eye exam.  If you are diagnosed with type 1 diabetes as an adult, get an exam within 5 years of diagnosis and then yearly.  If you are diagnosed with type 2 diabetes, get an exam as soon as possible after the diagnosis and then yearly. Foot care exam  Visual foot exams are performed at every routine medical visit. The exams check for cuts, injuries, or other problems with the feet.  You should have a complete foot exam performed every year. This exam includes an inspection of the structure and skin of your feet, a check of the  pulses in your feet, and a check of the sensation in your feet.  Type 1 diabetes: The first exam is performed 5 years after diagnosis.  Type 2 diabetes: The first exam is performed at the time of diagnosis.  Check your feet nightly for cuts, injuries, or other problems with your feet. Tell your health care provider if anything is not healing. Kidney function test (urine microalbumin)  This test is performed once a year.  Type 1 diabetes: The first test is performed 5 years after diagnosis.  Type 2 diabetes: The first test is performed at the time of diagnosis.  A serum creatinine and estimated glomerular filtration rate (eGFR) test is done once a year to assess the level of chronic kidney disease (CKD), if present. Lipid profile (cholesterol, HDL, LDL, triglycerides)  Performed every 5 years for most people.  The goal for LDL is less than 100 mg/dL. If you are at high risk, the goal is less than 70 mg/dL.  The goal for HDL is 40 mg/dL-50 mg/dL for men and 50 mg/dL-60 mg/dL for women. An HDL cholesterol of 60 mg/dL or higher gives some protection against heart disease.  The goal for triglycerides is less than 150 mg/dL. Immunizations  The flu (influenza) vaccine is recommended yearly for every person 71 months of age or older who has diabetes.  The pneumonia (pneumococcal) vaccine is recommended for every person 2 years of  age or older who has diabetes. Adults 50 years of age or older may receive the pneumonia vaccine as a series of two separate shots.  The hepatitis B vaccine is recommended for adults shortly after they have been diagnosed with diabetes.  The Tdap (tetanus, diphtheria, and pertussis) vaccine should be given:  According to normal childhood vaccination schedules, for children.  Every 10 years, for adults who have diabetes. Diabetes self-management education  Education is recommended at diagnosis and ongoing as needed. Treatment plan  Your treatment plan is  reviewed at every medical visit.   This information is not intended to replace advice given to you by your health care provider. Make sure you discuss any questions you have with your health care provider.   Document Released: 02/06/2009 Document Revised: 05/02/2014 Document Reviewed: 09/11/2012 Elsevier Interactive Patient Education [employer].

## 2015-02-24 NOTE — Progress Notes (Signed)
Chief Complaint:  Chief Complaint  Patient presents with  . Annual Exam  . Depression    see screening    HPI: Kenneth Mcfarland is a 34 y.o. male who [facility] complaining of   Annual visit, he wants the whole "enchilada" done . He has not eaten since this AM. 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 Hypertension   . Sleep apnea   . Sleep apnea   . Narcolepsy   . PTSD (post-traumatic stress disorder)   . Bipolar disorder (HCC)   . Sleep apnea    Past Surgical History  Procedure Laterality Date  . Tonsillectomy     Social History   Social History  . Marital Status: Single    Spouse Name: N/A  . Number of Children: N/A  . Years of Education: N/A   Social History Main Topics  . Smoking status: Current Every Day Smoker  . Smokeless tobacco: None  . Alcohol Use: Yes  . Drug Use: No  . Sexual Activity: Not Asked   Other Topics Concern  . None   Social History Narrative   Family History  Problem Relation Age of Onset  . Adopted: Yes   Allergies  Allergen Reactions  . Contrast Media [Iodinated Diagnostic  Agents] Other (See Comments)    Unknown   . Penicillins Other (See Comments)    unknown  . Vicodin [Hydrocodone-Acetaminophen] Diarrhea   Prior to Admission medications   Medication Sig Start Date End Date Taking? Authorizing Provider  HYDROcodone-acetaminophen (NORCO/VICODIN) 5-325 MG per tablet Take 2 tablets by mouth every 4 (four) hours as needed. 10/11/14  Yes Lonia SkinnerLeslie K Sofia, PA-C  lisinopril-hydrochlorothiazide (PRINZIDE,ZESTORETIC) 20-12.5 MG tablet Take 1 tablet by mouth daily.   Yes Historical Provider, MD  mupirocin cream (BACTROBAN) 2 % Apply 1 application topically 2 (two) times daily. 02/11/15  Yes April Palumbo, MD  Oxycodone HCl 10 MG TABS Take 10 mg by mouth every 6 (six) hours as needed (pain).   Yes Historical Provider, MD  citalopram (CELEXA) 40 MG tablet Take 40 mg by mouth daily.    Historical Provider, MD  clindamycin (CLEOCIN) 150 MG capsule Take 1 capsule (150 mg total) by mouth every 6 (six) hours. Patient not taking: Reported on 02/24/2015 10/11/14   Elson AreasLeslie K Sofia, PA-C  cyclobenzaprine (FLEXERIL) 10 MG tablet Take 1 tablet (10 mg total) by mouth 2 (two) times daily as  needed for muscle spasms. Patient not taking: Reported on 08/11/2014 05/07/14   Elpidio Anis, PA-C  meloxicam (MOBIC) 15 MG tablet Take 15 mg by mouth daily as needed for pain.    Historical Provider, MD  sulfamethoxazole-trimethoprim (BACTRIM DS,SEPTRA DS) 800-160 MG tablet Take 1 tablet by mouth 2 (two) times daily.    Historical Provider, MD     ROS: The patient denies fevers, chills, night sweats, unintentional weight loss, chest pain, palpitations, wheezing, dyspnea on exertion, nausea, vomiting, abdominal pain, dysuria, hematuria, melena, +n/t in feet only   All other systems have been reviewed and were otherwise negative with the exception of those mentioned in the HPI and as above.    PHYSICAL EXAM: Filed Vitals:   02/24/15 1551  BP: 116/80  Pulse: 96  Temp: 98.2 F (36.8 C)  Resp: 20    Body mass index is 53.73 kg/(m^2).   General: Alert, no acute distress. Morbidly obses HEENT:  Normocephalic, atraumatic, oropharynx patent. EOMI, PERRLA, fundoscopic exam normal, tm normal,  + dental caries. + thrush Cardiovascular:  Regular rate and rhythm, no rubs murmurs or gallops.  No Carotid bruits, radial pulse intact. + pedal edema.  Respiratory: Clear to auscultation bilaterally.  No wheezes, rales, or rhonchi.  No cyanosis, no use of accessory musculature Abdominal: No organomegaly, abdomen is soft and non-tender, positive bowel sounds. No masses. Skin: + small noninfected appearing wound due to venous stasis Neurologic: Facial musculature symmetric. Psychiatric: Patient acts appropriately throughout our interaction. He doses off during the conversation Lymphatic: No cervical or submandibular lymphadenopathy Musculoskeletal: Gait intact. No  Tenderness + abd pannus, no rashes under pannus Neg inguinal hernia, uncircumcised, no masses lesions dc or rashes   LABS: Results for orders placed or performed in visit on 02/24/15  COMPLETE METABOLIC PANEL WITH GFR  Result Value Ref Range   Sodium 139 135 - 146 mmol/L   Potassium 4.4 3.5 - 5.3 mmol/L   Chloride 98 98 - 110 mmol/L   CO2 33 (H) 20 - 31 mmol/L   Glucose, Bld 139 (H) 65 - 99 mg/dL   BUN 9 7 - 25 mg/dL   Creat 6.96 2.95 - 2.84 mg/dL   Total Bilirubin 0.4 0.2 - 1.2 mg/dL   Alkaline Phosphatase 90 40 - 115 U/L   AST 31 10 - 40 U/L   ALT 27 9 - 46 U/L   Total Protein 7.7 6.1 - 8.1 g/dL   Albumin 4.0 3.6 - 5.1 g/dL   Calcium 8.8 8.6 - 13.2 mg/dL   GFR, Est African American >89 >=60 mL/min   GFR, Est Non African American >89 >=60 mL/min  TSH  Result Value Ref Range   TSH 0.717 0.350 - 4.500 uIU/mL  Lipid panel  Result Value Ref Range   Cholesterol 168 125 - 200 mg/dL   Triglycerides 440 <102 mg/dL   HDL 32 (L) >=72 mg/dL   Total CHOL/HDL Ratio 5.3 (H) <=5.0 Ratio   VLDL 30 <30 mg/dL   LDL Cholesterol 536  <130 mg/dL  Hemoglobin U4Q  Result Value Ref Range   Hgb A1c MFr Bld 7.0 (A) 4.0 - 6.0 %  POCT glycosylated hemoglobin (Hb A1C)  Result Value Ref Range   Hemoglobin A1C 7.0   POCT CBC  Result Value Ref Range   WBC 6.8 4.6 - 10.2 K/uL   Lymph, poc 2.3 0.6 - 3.4   POC LYMPH PERCENT 33.3 10 - 50 %L   MID (cbc) 0.4 0 - 0.9  POC MID % 6.3 0 - 12 %M   POC Granulocyte 4.1 2 - 6.9   Granulocyte percent 60.4 37 - 80 %G   RBC 5.69 4.69 - 6.13 M/uL   Hemoglobin 16.6 14.1 - 18.1 g/dL   HCT, POC 78.4 69.6 - 53.7 %   MCV 90.1 80 - 97 fL   MCH, POC 29.2 27 - 31.2 pg   MCHC 32.4 31.8 - 35.4 g/dL   RDW, POC 29.5 %   Platelet Count, POC 161 142 - 424 K/uL   MPV 7.9 0 - 99.8 fL     EKG/XRAY:   Primary read interpreted by Dr. Conley Rolls at [facility].   ASSESSMENT/PLAN: Encounter Diagnoses  Name Primary?  . Annual physical exam Yes  . Screening for deficiency anemia   . Screening for hyperlipidemia   . Screening for thyroid disorder   . OSA (obstructive sleep apnea)   . Narcolepsy   . Essential hypertension   . Screening for diabetes mellitus   . Exophthalmos   . Morbid obesity, unspecified obesity type (HCC)   . Type 2 diabetes mellitus with diabetic polyneuropathy, without long-term current use of insulin (HCC)   . Screening for STD (sexually transmitted disease)   . Thrush    Morbidly obese 34 y/o male with PMH of HTN , tobacco uses, chronic LE edema, OSA not on CPAP, narcolepsy, bipolar, chroni cpain Refer to sleep study Refer to nutrition/diabetes education Refer to opthalmology Labs pending Rx metformin, rx nystatin solution Fu in 1-2 months Decline flu vaccine  Recommend : ADA diet, BP goal <140/90, daily foot exams, tobacco cessation if smoking, annual eye exam, annual flu vaccine, PNA vaccine if age and time appropriate.    Gross sideeffects, risk and benefits, and alternatives of medications d/w patient. Patient is aware that all medications have potential sideeffects and we are  unable to predict every sideeffect or drug-drug interaction that may occur.  Thao Le DO  02/25/2015 5:27 PM

## 2015-02-25 DIAGNOSIS — G47419 Narcolepsy without cataplexy: Secondary | ICD-10-CM | POA: Insufficient documentation

## 2015-02-25 DIAGNOSIS — E1142 Type 2 diabetes mellitus with diabetic polyneuropathy: Secondary | ICD-10-CM | POA: Insufficient documentation

## 2015-02-25 DIAGNOSIS — I10 Essential (primary) hypertension: Secondary | ICD-10-CM | POA: Insufficient documentation

## 2015-02-25 DIAGNOSIS — G4733 Obstructive sleep apnea (adult) (pediatric): Secondary | ICD-10-CM | POA: Insufficient documentation

## 2015-02-25 LAB — COMPLETE METABOLIC PANEL WITH GFR
ALT: 27 U/L (ref 9–46)
AST: 31 U/L (ref 10–40)
BUN: 9 mg/dL (ref 7–25)
Calcium: 8.8 mg/dL (ref 8.6–10.3)
Chloride: 98 mmol/L (ref 98–110)
GFR, Est Non African American: >89 mL/min (ref >=60)
Potassium: 4.4 mmol/L (ref 3.5–5.3)
Sodium: 139 mmol/L (ref 135–146)

## 2015-02-25 LAB — LIPID PANEL
Cholesterol: 168 mg/dL (ref 125–200)
HDL: 32 mg/dL — ABNORMAL LOW (ref >=40)
LDL Cholesterol: 106 mg/dL (ref <130)
Total CHOL/HDL Ratio: 5.3 Ratio — ABNORMAL HIGH (ref <=5.0)
Triglycerides: 149 mg/dL (ref <150)
VLDL: 30 mg/dL (ref <30)

## 2015-02-25 LAB — TSH: TSH: 0.717 u[IU]/mL (ref 0.350–4.500)

## 2015-02-25 LAB — COMPLETE METABOLIC PANEL WITHOUT GFR
Albumin: 4 g/dL (ref 3.6–5.1)
Alkaline Phosphatase: 90 U/L (ref 40–115)
CO2: 33 mmol/L — ABNORMAL HIGH (ref 20–31)
Creat: 1.04 mg/dL (ref 0.60–1.35)
GFR, Est African American: >89 mL/min (ref >=60)
Glucose, Bld: 139 mg/dL — ABNORMAL HIGH (ref 65–99)
Total Bilirubin: 0.4 mg/dL (ref 0.2–1.2)
Total Protein: 7.7 g/dL (ref 6.1–8.1)

## 2015-02-25 LAB — MICROALBUMIN, URINE: Microalb, Ur: 1.5 mg/dL

## 2015-02-26 LAB — RPR

## 2015-02-28 LAB — GC/CHLAMYDIA PROBE AMP
CT Probe RNA: NEGATIVE
GC Probe RNA: NEGATIVE

## 2015-03-02 ENCOUNTER — Encounter: Payer: Self-pay | Admitting: Neurology

## 2015-03-02 ENCOUNTER — Encounter (INDEPENDENT_AMBULATORY_CARE_PROVIDER_SITE_OTHER): Payer: Self-pay

## 2015-03-02 ENCOUNTER — Ambulatory Visit (INDEPENDENT_AMBULATORY_CARE_PROVIDER_SITE_OTHER): Payer: BLUE CROSS/BLUE SHIELD | Admitting: Neurology

## 2015-03-02 VITALS — BP 132/90 | HR 96 | Resp 20

## 2015-03-02 DIAGNOSIS — J441 Chronic obstructive pulmonary disease with (acute) exacerbation: Secondary | ICD-10-CM | POA: Diagnosis not present

## 2015-03-02 DIAGNOSIS — G4733 Obstructive sleep apnea (adult) (pediatric): Secondary | ICD-10-CM

## 2015-03-02 DIAGNOSIS — G4726 Circadian rhythm sleep disorder, shift work type: Secondary | ICD-10-CM | POA: Diagnosis not present

## 2015-03-02 DIAGNOSIS — R351 Nocturia: Secondary | ICD-10-CM | POA: Diagnosis not present

## 2015-03-02 DIAGNOSIS — E662 Morbid (severe) obesity with alveolar hypoventilation: Secondary | ICD-10-CM | POA: Diagnosis not present

## 2015-03-02 DIAGNOSIS — E1140 Type 2 diabetes mellitus with diabetic neuropathy, unspecified: Secondary | ICD-10-CM | POA: Insufficient documentation

## 2015-03-02 NOTE — Addendum Note (Signed)
Addended by: Melvyn NovasHMEIER, Marketta Valadez on: 03/02/2015 05:10 PM   Modules accepted: Orders
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
## 2015-03-02 NOTE — Patient Instructions (Signed)

## 2015-03-03 ENCOUNTER — Ambulatory Visit (INDEPENDENT_AMBULATORY_CARE_PROVIDER_SITE_OTHER): Payer: BLUE CROSS/BLUE SHIELD | Admitting: Neurology

## 2015-03-03 DIAGNOSIS — G4733 Obstructive sleep apnea (adult) (pediatric): Secondary | ICD-10-CM | POA: Diagnosis not present

## 2015-03-03 DIAGNOSIS — E662 Morbid (severe) obesity with alveolar hypoventilation: Secondary | ICD-10-CM

## 2015-03-03 DIAGNOSIS — G4726 Circadian rhythm sleep disorder, shift work type: Secondary | ICD-10-CM

## 2015-03-03 DIAGNOSIS — R351 Nocturia: Secondary | ICD-10-CM

## 2015-03-03 DIAGNOSIS — E1140 Type 2 diabetes mellitus with diabetic neuropathy, unspecified: Secondary | ICD-10-CM

## 2015-03-04 LAB — HM DIABETES EYE EXAM

## 2015-03-04 NOTE — Sleep Study (Signed)
Please see the scanned sleep study interpretation located in the Procedure tab within the Chart Review section. 

## 2015-03-05 ENCOUNTER — Telehealth: Payer: Self-pay

## 2015-03-05 DIAGNOSIS — G4733 Obstructive sleep apnea (adult) (pediatric): Secondary | ICD-10-CM

## 2015-03-05 NOTE — Telephone Encounter (Signed)
I called pt with sleep study results. I advised him that his PSG revealed the severest osa. Dr. Vickey Hugerohmeier advises him to start auto cpap. Pt is willing to proceed with cpap. I made a f/u appt with pt on 05/03/14 for insurance purposes. Pt knows to bring cpap machine to appt. Pt verbalized understanding. Advised pt to lose weight, diet, and exercise if not contraindicated and to avoid driving or operating hazardous machinery when sleepy. Pt verbalized understanding.   [facility]

## 2015-03-10 ENCOUNTER — Ambulatory Visit: Payer: Self-pay

## 2015-03-12 ENCOUNTER — Encounter: Payer: Self-pay | Admitting: Dietician

## 2015-03-12 ENCOUNTER — Encounter: Payer: BLUE CROSS/BLUE SHIELD | Attending: Family Medicine | Admitting: Dietician

## 2015-03-12 DIAGNOSIS — Z713 Dietary counseling and surveillance: Secondary | ICD-10-CM | POA: Diagnosis not present

## 2015-03-12 DIAGNOSIS — E119 Type 2 diabetes mellitus without complications: Secondary | ICD-10-CM | POA: Diagnosis not present

## 2015-03-12 DIAGNOSIS — Z6841 Body Mass Index (BMI) 40.0 and over, adult: Secondary | ICD-10-CM | POA: Diagnosis not present
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
## 2015-03-17 ENCOUNTER — Ambulatory Visit: Payer: Self-pay

## 2015-03-25 ENCOUNTER — Ambulatory Visit (INDEPENDENT_AMBULATORY_CARE_PROVIDER_SITE_OTHER): Payer: BLUE CROSS/BLUE SHIELD | Admitting: Family Medicine

## 2015-03-25 DIAGNOSIS — G894 Chronic pain syndrome: Secondary | ICD-10-CM | POA: Diagnosis not present

## 2015-03-25 DIAGNOSIS — E1142 Type 2 diabetes mellitus with diabetic polyneuropathy: Secondary | ICD-10-CM | POA: Diagnosis not present

## 2015-03-25 DIAGNOSIS — F332 Major depressive disorder, recurrent severe without psychotic features: Secondary | ICD-10-CM

## 2015-03-25 DIAGNOSIS — G4733 Obstructive sleep apnea (adult) (pediatric): Secondary | ICD-10-CM

## 2015-03-25 MED ORDER — GABAPENTIN 300 MG PO CAPS: 300.0000 mg | ORAL_CAPSULE | Freq: Two times a day (BID) | ORAL | Status: AC

## 2015-03-25 NOTE — Progress Notes (Signed)
Subjective:    Patient ID: Kenneth Mcfarland, male    DOB: [birth year], 34 y.o.   MRN: [id number]  HPI This is a 34 yo male who is accompanied today by his girlfriend. He wants to discuss his recent diagnosis of diabetes, depression and his chronic pain. He was seen earlier this month for a CPE and was started on metformin and has started diabetic education. His HgbA1c was 7.0. He is very pleased today that he has not gained weight over Thanksgiving. He drinks several servings of soda and juice daily. He enjoys cooking.   He has 2 children 8,11. His other son passed away last year, choked to death on a hot dog. His fiancee died from an epileptic seizure last year. He was in therapy but does not have time for it now as he works two jobs. He was taking citalopram but stopped it because he wasn't sure if it was helping. He has special needs children. He thinks about suicide but would "never go through with it."   Has had chronic pain since 2012. He has arthritis in right knee. Altered sensation of right foot, loss of sensation in big toe, low back pain and hip pain. Sees Dr. Ronne BinningMcKenzie who prescribes oxycodone. He uses 3-4x/ day.   Has recently had sleep study but is unable to afford Cpap machine.   Has recently stopped smoking and is vaping.   Past Medical History  Diagnosis Date  . Hypertension   . Sleep apnea   . Sleep apnea   . Narcolepsy   . PTSD (post-traumatic stress disorder)   . Bipolar disorder (HCC)   . Sleep apnea   . Diabetes mellitus without complication (HCC)   . Asthma    Past Surgical History  Procedure Laterality Date  . Tonsillectomy     Family History  Problem Relation Age of Onset  . Adopted: Yes  . Breast cancer Mother   . Bone cancer Father    Social History  Substance Use Topics  . Smoking status: Current Every Day Smoker -- 0.75 packs/day for 5 years    Types: Cigarettes  . Smokeless tobacco: Not on file  . Alcohol Use: 0.0 oz/week    0 Standard drinks or  equivalent per week     Comment: rarely   Review of Systems  Constitutional: Positive for fatigue (works two jobs, doesn't sleep enough or sleep well). Negative for fever, activity change, appetite change and unexpected weight change.  Respiratory: Positive for shortness of breath (with exertion, not with daily activities). Negative for cough and chest tightness.   Cardiovascular: Positive for leg swelling. Negative for chest pain.  Neurological: Negative for light-headedness and headaches.  Psychiatric/Behavioral: Positive for sleep disturbance (OSA) and dysphoric mood. Negative for suicidal ideas.      Objective:   Physical Exam  Constitutional: He is oriented to person, place, and time. He appears well-developed and well-nourished. No distress.  Morbidly obese  HENT:  Head: Normocephalic and atraumatic.  Eyes: Conjunctivae are normal.  Neck: Normal range of motion. Neck supple.  Cardiovascular: Normal rate, regular rhythm and normal heart sounds.   Pulmonary/Chest: Effort normal and breath sounds normal.  Musculoskeletal: Normal range of motion. He exhibits edema (trace pretibial).  Neurological: He is alert and oriented to person, place, and time.  Skin: Skin is warm and dry. He is not diaphoretic.  Psychiatric: He has a normal mood and affect. His behavior is normal. Judgment and thought content normal.  Vitals reviewed.  BP  151/86 mmHg  Pulse 87  Temp(Src) 98.3 F (36.8 C) (Oral)  Resp 16  Ht 5' 8.25" (1.734 m)  Wt 363 lb 12.8 oz (165.019 kg)  BMI 54.88 kg/m2  SpO2 88% Recheck pulse ox 95%, recheck BP 147/91 Wt Readings from Last 3 Encounters:  03/25/15 363 lb 12.8 oz (165.019 kg)  03/12/15 364 lb (165.109 kg)  02/24/15 364 lb (165.109 kg)   Depression screen [facility] 2/9 03/25/2015 03/02/2015 02/24/2015  Decreased Interest 3 1 0  Down, Depressed, Hopeless PHQ - 2 Score Altered sleeping Tired, decreased energy Change in appetite Feeling bad or failure about yourself  Trouble concentrating Moving slowly or fidgety/restless 3 1 0  Suicidal thoughts PHQ-9 Score Difficult doing work/chores - Somewhat difficult -      Assessment & Plan:  1. Type 2 diabetes mellitus with diabetic polyneuropathy, without long-term current use of insulin (HCC) - discussed patient's recent lab results and HbgA1C of 7.0 and that his chronic pain is not likely due to his diabetes  2. Major depressive disorder, recurrent severe without psychotic features (HCC) - patient continues to have depressive symptoms, denies suicide plan. Is hopeful about improvement of his physical condition - discussed importance of adequate sleep, stress management and using medication/resuming therapy if symptoms worsen.  3. Chronic pain syndrome - discussed concerns for long term opioid use and need for pain specialist management - Ambulatory referral to Pain Clinic- advised him that this could take awhile. - gabapentin (NEURONTIN) 300 MG capsule; Take 1 capsule (300 mg total) by mouth 2 (two) times daily. Take 1 at bedtime for 1 week, then increase to twice a day.  Dispense: 90 capsule; Refill: 3  4. Morbid obesity, unspecified obesity type (HCC) - encouraged continued work with dietician and making healthy food choices and weight loss goals. - Stressed importance of avoiding soda and juice and eating regular meals and snacks.   5. OSA (obstructive sleep apnea) - patient with need for CPAP, but is unable to afford machine. I have encouraged him to call [facility] to see if there are any patient assistance programs.  - Stressed role of quality sleep in mood and weight loss efforts.   - follow up in 4 months, sooner if worsening symptoms  Olean Ree, FNP-BC  [facility], [facility]  03/31/2015 9:52 PM

## 2015-03-26 ENCOUNTER — Other Ambulatory Visit: Payer: Self-pay

## 2015-03-26 MED ORDER — LISINOPRIL 5 MG PO TABS: 5.0000 mg | ORAL_TABLET | Freq: Every day | ORAL | Status: DC

## 2015-03-26 MED ORDER — METFORMIN HCL 500 MG PO TABS: 500.0000 mg | ORAL_TABLET | Freq: Two times a day (BID) | ORAL | Status: DC

## 2015-03-26 MED ORDER — FUROSEMIDE 20 MG PO TABS: 20.0000 mg | ORAL_TABLET | Freq: Two times a day (BID) | ORAL | Status: AC

## 2015-03-27 ENCOUNTER — Telehealth: Payer: Self-pay | Admitting: Neurology

## 2015-03-27 NOTE — Telephone Encounter (Signed)
Pt called sts he got pricing for CPAP machine but cannot afford it. He is inquiring if there is a program that would help cover the out of pocket expenses. If not he knows someone who has an older machine he could have. Please call and advise

## 2015-03-27 NOTE — Telephone Encounter (Signed)
Can you look at this

## 2015-03-30 NOTE — Telephone Encounter (Signed)
Spoke to patient advised his message was recvd and fwd to sleep lab manager for review. Pt was grateful.

## 2015-03-30 NOTE — Telephone Encounter (Signed)
I know that [facility] started a program I will send this message toRobin Hyler, Sleep lab  Manager. CD

## 2015-03-31 ENCOUNTER — Encounter: Payer: Self-pay | Admitting: Family Medicine

## 2015-04-20 ENCOUNTER — Emergency Department (HOSPITAL_COMMUNITY)
Admission: EM | Admit: 2015-04-20 | Discharge: 2015-04-21 | Disposition: A | Discharge status: Home / Self Care | Payer: BLUE CROSS/BLUE SHIELD | Attending: Emergency Medicine | Admitting: Emergency Medicine

## 2015-04-20 ENCOUNTER — Emergency Department (HOSPITAL_COMMUNITY): Payer: BLUE CROSS/BLUE SHIELD

## 2015-04-20 DIAGNOSIS — L02214 Cutaneous abscess of groin: Secondary | ICD-10-CM | POA: Insufficient documentation

## 2015-04-20 DIAGNOSIS — E669 Obesity, unspecified: Secondary | ICD-10-CM | POA: Insufficient documentation

## 2015-04-20 DIAGNOSIS — F17210 Nicotine dependence, cigarettes, uncomplicated: Secondary | ICD-10-CM | POA: Insufficient documentation

## 2015-04-20 DIAGNOSIS — F319 Bipolar disorder, unspecified: Secondary | ICD-10-CM | POA: Insufficient documentation

## 2015-04-20 DIAGNOSIS — J45909 Unspecified asthma, uncomplicated: Secondary | ICD-10-CM | POA: Diagnosis not present

## 2015-04-20 DIAGNOSIS — Z79899 Other long term (current) drug therapy: Secondary | ICD-10-CM | POA: Insufficient documentation

## 2015-04-20 DIAGNOSIS — H509 Unspecified strabismus: Secondary | ICD-10-CM | POA: Diagnosis not present

## 2015-04-20 DIAGNOSIS — E119 Type 2 diabetes mellitus without complications: Secondary | ICD-10-CM | POA: Diagnosis not present

## 2015-04-20 DIAGNOSIS — Z880 Allergy status to penicillin: Secondary | ICD-10-CM | POA: Diagnosis not present

## 2015-04-20 DIAGNOSIS — I10 Essential (primary) hypertension: Secondary | ICD-10-CM | POA: Insufficient documentation

## 2015-04-20 DIAGNOSIS — L089 Local infection of the skin and subcutaneous tissue, unspecified: Secondary | ICD-10-CM | POA: Diagnosis present

## 2015-04-20 LAB — BASIC METABOLIC PANEL
Anion gap: 7 (ref 5–15)
BUN: 10 mg/dL (ref 6–20)
CALCIUM: 9 mg/dL (ref 8.9–10.3)
CHLORIDE: 101 mmol/L (ref 101–111)
CO2: 31 mmol/L (ref 22–32)
CREATININE: 1.05 mg/dL (ref 0.61–1.24)
GFR calc non Af Amer: >60 mL/min (ref >60)
GLUCOSE: 168 mg/dL — AB (ref 65–99)
Potassium: 4.2 mmol/L (ref 3.5–5.1)
Sodium: 139 mmol/L (ref 135–145)

## 2015-04-20 LAB — CBC WITH DIFFERENTIAL/PLATELET
Basophils Absolute: 0 10*3/uL (ref 0.0–0.1)
Basophils Relative: 0 %
Eosinophils Absolute: 0.2 10*3/uL (ref 0.0–0.7)
Eosinophils Relative: 3 %
HEMATOCRIT: 50.7 % (ref 39.0–52.0)
HEMOGLOBIN: 15.8 g/dL (ref 13.0–17.0)
LYMPHS ABS: 2.2 10*3/uL (ref 0.7–4.0)
LYMPHS PCT: 29 %
MCH: 28.9 pg (ref 26.0–34.0)
MCHC: 31.2 g/dL (ref 30.0–36.0)
MCV: 92.7 fL (ref 78.0–100.0)
MONO ABS: 0.5 10*3/uL (ref 0.1–1.0)
MONOS PCT: 7 %
NEUTROS ABS: 4.7 10*3/uL (ref 1.7–7.7)
NEUTROS PCT: 61 %
Platelets: 182 10*3/uL (ref 150–400)
RBC: 5.47 MIL/uL (ref 4.22–5.81)
RDW: 13.8 % (ref 11.5–15.5)
WBC: 7.6 10*3/uL (ref 4.0–10.5)

## 2015-04-20 LAB — CBG MONITORING, ED: Glucose-Capillary: 100 mg/dL — ABNORMAL HIGH (ref 65–99)

## 2015-04-20 MED ORDER — LIDOCAINE-EPINEPHRINE (PF) 2 %-1:200000 IJ SOLN
20.0000 mL | Freq: Once | INTRAMUSCULAR | Status: AC
  Administered 2015-04-20: 20 mL via INTRADERMAL
  Filled 2015-04-20: qty 20

## 2015-04-20 MED ORDER — FENTANYL CITRATE (PF) 100 MCG/2ML IJ SOLN
50.0000 ug | Freq: Once | INTRAMUSCULAR | Status: AC
  Administered 2015-04-20: 50 ug via NASAL
  Filled 2015-04-20: qty 2

## 2015-04-20 MED ORDER — FENTANYL CITRATE (PF) 100 MCG/2ML IJ SOLN
75.0000 ug | Freq: Once | INTRAMUSCULAR | Status: AC
  Administered 2015-04-20: 75 ug via NASAL
  Filled 2015-04-20: qty 2

## 2015-04-20 NOTE — ED Notes (Signed)
Pt reports an abscess to the left groin area. Pt states that he has had multiple abscess in the past.

## 2015-04-20 NOTE — ED Notes (Signed)
PA at bedside.

## 2015-04-20 NOTE — ED Notes (Signed)
I&D kit and lidocaine at bedside.

## 2015-04-20 NOTE — ED Notes (Signed)
CBG 100. Provided graham crackers, peanut butter, and gingerale to patient per PA request.

## 2015-04-20 NOTE — ED Provider Notes (Signed)
CSN: [id number]     Arrival date & time 04/20/15  1437 History   First MD Initiated Contact with Patient 04/20/15 2023     Chief Complaint  Patient presents with  . Recurrent Skin Infections     (Consider location/radiation/quality/duration/timing/severity/associated sxs/prior Treatment) The history is provided by the patient.     Patient is a 34 year old male with history of diabetes, asthma, hypertension, who presents emergency department with left groin abscesses with history of similar infections in the past with I&D.  He states he had a small bump approximately a week ago, which had some intermittent drainage, but then became progressively larger and more tender.  He denies fever, however he endorses chills and nausea.  He denies vomiting, sweats, scrotal pain or swelling.  No other complaints  Past Medical History  Diagnosis Date  . Hypertension   . Sleep apnea   . Sleep apnea   . Narcolepsy   . PTSD (post-traumatic stress disorder)   . Bipolar disorder (HCC)   . Sleep apnea   . Diabetes mellitus without complication (HCC)   . Asthma    Past Surgical History  Procedure Laterality Date  . Tonsillectomy     Family History  Problem Relation Age of Onset  . Adopted: Yes  . Breast cancer Mother   . Bone cancer Father    Social History  Substance Use Topics  . Smoking status: Current Every Day Smoker -- 0.75 packs/day for 5 years    Types: Cigarettes  . Smokeless tobacco: Not on file  . Alcohol Use: 0.0 oz/week    0 Standard drinks or equivalent per week     Comment: rarely    Review of Systems  All other systems reviewed and are negative.     Allergies  Contrast media; Penicillins; and Vicodin  Home Medications   Prior to Admission medications   Medication Sig Start Date End Date Taking? Authorizing Provider  ALPRAZolam Prudy Feeler) 1 MG tablet Take 1 mg by mouth 3 (three) times daily as needed for anxiety.  02/25/15  Yes Historical Provider, MD  furosemide  (LASIX) 20 MG tablet Take 1 tablet (20 mg total) by mouth 2 (two) times daily. 03/26/15  Yes Thao P Le, DO  gabapentin (NEURONTIN) 300 MG capsule Take 1 capsule (300 mg total) by mouth 2 (two) times daily. Take 1 at bedtime for 1 week, then increase to twice a day. 03/25/15  Yes Emi Belfast, FNP  lisinopril (PRINIVIL,ZESTRIL) 5 MG tablet Take 1 tablet (5 mg total) by mouth daily. 03/26/15  Yes Emi Belfast, FNP  meloxicam (MOBIC) 15 MG tablet Take 15 mg by mouth daily as needed for pain.   Yes Historical Provider, MD  metFORMIN (GLUCOPHAGE) 500 MG tablet Take 1 tablet (500 mg total) by mouth 2 (two) times daily with a meal. 03/26/15  Yes Emi Belfast, FNP  Oxycodone HCl 10 MG TABS Take 10 mg by mouth every 6 (six) hours as needed (pain).   Yes Historical Provider, MD  cephALEXin (KEFLEX) 500 MG capsule Take 1 capsule (500 mg total) by mouth 4 (four) times daily. 04/21/15   Danelle Berry, PA-C  oxyCODONE-acetaminophen (PERCOCET) 5-325 MG tablet Take 1 tablet by mouth every 6 (six) hours as needed for severe pain. 04/21/15   Danelle Berry, PA-C  sulfamethoxazole-trimethoprim (BACTRIM DS,SEPTRA DS) 800-160 MG tablet Take 1 tablet by mouth 2 (two) times daily. 04/21/15 04/28/15  Danelle Berry, PA-C   BP 142/99 mmHg  Pulse 80  Temp(Src)  98.1 F (36.7 C) (Oral)  Resp 18  SpO2 98% Physical Exam  Constitutional: He is oriented to person, place, and time. He appears well-developed and well-nourished. No distress.  Obese male, appears stated age, appears uncomfortable  HENT:  Head: Normocephalic and atraumatic.  Right Ear: External ear normal.  Left Ear: External ear normal.  Nose: Nose normal.  MMM  Eyes: Conjunctivae are normal. Pupils are equal, round, and reactive to light. Right eye exhibits no discharge. Left eye exhibits no discharge. No scleral icterus.  Left eye strabismus  Neck: Normal range of motion. Neck supple. No JVD present. No tracheal deviation present.  Cardiovascular:  Normal rate and regular rhythm.   Pulmonary/Chest: Effort normal and breath sounds normal. No stridor. No respiratory distress.  Genitourinary: Testes normal.    Right testis shows no mass and no tenderness. Left testis shows no mass and no tenderness.  Musculoskeletal: Normal range of motion. He exhibits no edema.  Lymphadenopathy:    He has no cervical adenopathy.  Neurological: He is alert and oriented to person, place, and time. He exhibits normal muscle tone. Coordination normal.  Skin: Skin is warm and dry. No rash noted. He is not diaphoretic. There is erythema. No pallor.  Psychiatric: He has a normal mood and affect. His behavior is normal. Judgment and thought content normal.  Nursing note and vitals reviewed.   ED Course  Procedures (including critical care time)  INCISION AND DRAINAGE Performed by: Danelle Berry Consent: Verbal consent obtained. Risks and benefits: risks, benefits and alternatives were discussed Time out performed prior to procedure Type: abscess Body area: left groin Anesthesia: local infiltration Incision was made with a scalpel. Local anesthetic: lidocaine 2% with epinephrine Anesthetic total: 2 ml Complexity: complex Blunt dissection to break up loculations Drainage: purulent Drainage amount: copious Packing material: 1/4" iodoform Patient tolerance: Patient tolerated the procedure well with no immediate complications.  Labs Review Labs Reviewed  BASIC METABOLIC PANEL - Abnormal; Notable for the following:    Glucose, Bld 168 (*)    All other components within normal limits  CBG MONITORING, ED - Abnormal; Notable for the following:    Glucose-Capillary 100 (*)    All other components within normal limits  CBC WITH DIFFERENTIAL/PLATELET    Imaging Review US Pelvis Limited (Final result) Result time: 04/20/15 22:41:44   Final result by Rad Results In Interface (04/20/15 22:41:44)   Narrative:   CLINICAL DATA: Acute onset of recurrent  left groin abscess. Further evaluation requested. Initial encounter.  EXAM: LIMITED ULTRASOUND OF PELVIS  TECHNIQUE: Limited transabdominal ultrasound examination of the pelvis was performed.  COMPARISON: Pelvic ultrasound performed 12/30/2013  FINDINGS: A loculated complex collection of fluid is noted at the left inguinal region, measuring approximately 3.2 x 1.8 x 2.3 cm. This is seen lateral to the scrotal sac. Trace blood flow is noted about the periphery of the abscess. This region is indurated and warm to touch. Surrounding soft tissues are otherwise grossly unremarkable.  IMPRESSION: Loculated complex abscess at the left inguinal region, measuring 3.2 x 1.8 x 2.3 cm, noted lateral to the scrotal sac.   Electronically Signed By: Roanna Raider M.D. On: 04/20/2015 22:41     No results found. I have personally reviewed and evaluated these images and lab results as part of my medical decision-making.   EKG Interpretation None      MDM   Pt with left groin abscess, hx of recurrent abscesses, DM Basic labs and ultrasound obtained to r/o deep space  infection/tracking US pertinent for loculated complex left inguinal collection of fluid, 3.2 x 1.8 x 2.3 with surrounding induration and warmth, no tracking to scrotum. Pt did not have a white count, mildly elevated blood sugar, pt took his home medication while in the ED.  Abscess was drained and packed with 1/4" iodoform.  Pt was given care instructions, will return to ED for recheck in 2 days.  He was given keflex and bactrim, first dose administered in the ER.  Pt was discharged in stable condition.  Final diagnoses:  Abscess of left groin       Danelle BerryLeisa Wynter Isaacs, PA-C 04/23/15 [id number]  Derwood KaplanAnkit Nanavati, MD 04/23/15 1605

## 2015-04-21 MED ORDER — SULFAMETHOXAZOLE-TRIMETHOPRIM 800-160 MG PO TABS
1.0000 | ORAL_TABLET | Freq: Once | ORAL | Status: AC
  Administered 2015-04-21: 1 via ORAL
  Filled 2015-04-21: qty 1

## 2015-04-21 MED ORDER — OXYCODONE-ACETAMINOPHEN 5-325 MG PO TABS: 1.0000 | ORAL_TABLET | Freq: Four times a day (QID) | ORAL | Status: DC | PRN

## 2015-04-21 MED ORDER — CEPHALEXIN 500 MG PO CAPS: 500.0000 mg | ORAL_CAPSULE | Freq: Four times a day (QID) | ORAL | Status: DC

## 2015-04-21 MED ORDER — SULFAMETHOXAZOLE-TRIMETHOPRIM 800-160 MG PO TABS: 1.0000 | ORAL_TABLET | Freq: Two times a day (BID) | ORAL | Status: AC

## 2015-04-21 MED ORDER — CEPHALEXIN 250 MG PO CAPS
500.0000 mg | ORAL_CAPSULE | Freq: Once | ORAL | Status: AC
  Administered 2015-04-21: 500 mg via ORAL
  Filled 2015-04-21: qty 2

## 2015-04-21 NOTE — Discharge Instructions (Signed)
Abscess °An abscess is an infected area that contains a collection of pus and debris. It can occur in almost any part of the body. An abscess is also known as a furuncle or boil. °CAUSES  °An abscess occurs when tissue gets infected. This can occur from blockage of oil or sweat glands, infection of hair follicles, or a minor injury to the skin. As the body tries to fight the infection, pus collects in the area and creates pressure under the skin. This pressure causes pain. People with weakened immune systems have difficulty fighting infections and get certain abscesses more often.  °SYMPTOMS °Usually an abscess develops on the skin and becomes a painful mass that is red, warm, and tender. If the abscess forms under the skin, you may feel a moveable soft area under the skin. Some abscesses break open (rupture) on their own, but most will continue to get worse without care. The infection can spread deeper into the body and eventually into the bloodstream, causing you to feel ill.  °DIAGNOSIS  °Your caregiver will take your medical history and perform a physical exam. A sample of fluid may also be taken from the abscess to determine what is causing your infection. °TREATMENT  °Your caregiver may prescribe antibiotic medicines to fight the infection. However, taking antibiotics alone usually does not cure an abscess. Your caregiver may need to make a small cut (incision) in the abscess to drain the pus. In some cases, gauze is packed into the abscess to reduce pain and to continue draining the area. °HOME CARE INSTRUCTIONS  °· Only take over-the-counter or prescription medicines for pain, discomfort, or fever as directed by your caregiver. °· If you were prescribed antibiotics, take them as directed. Finish them even if you start to feel better. °· If gauze is used, follow your caregiver's directions for changing the gauze. °· To avoid spreading the infection: °· Keep your draining abscess covered with a  bandage. °· Wash your hands well. °· Do not share personal care items, towels, or whirlpools with others. °· Avoid skin contact with others. °· Keep your skin and clothes clean around the abscess. °· Keep all follow-up appointments as directed by your caregiver. °SEEK MEDICAL CARE IF:  °· You have increased pain, swelling, redness, fluid drainage, or bleeding. °· You have muscle aches, chills, or a general ill feeling. °· You have a fever. °MAKE SURE YOU:  °· Understand these instructions. °· Will watch your condition. °· Will get help right away if you are not doing well or get worse. °  °This information is not intended to replace advice given to you by your health care provider. Make sure you discuss any questions you have with your health care provider. °  °Document Released: 01/19/2005 Document Revised: 10/11/2011 Document Reviewed: 06/24/2011 °Elsevier Interactive Patient Education ©2016 [employer] ° °Incision and Drainage °Incision and drainage is a procedure in which a sac-like structure (cystic structure) is opened and drained. The area to be drained usually contains material such as pus, fluid, or blood.  °LET YOUR CAREGIVER KNOW ABOUT:  °· Allergies to medicine. °· Medicines taken, including vitamins, herbs, eyedrops, over-the-counter medicines, and creams. °· Use of steroids (by mouth or creams). °· Previous problems with anesthetics or numbing medicines. °· History of bleeding problems or blood clots. °· Previous surgery. °· Other health problems, including diabetes and kidney problems. °· Possibility of pregnancy, if this applies. °RISKS AND COMPLICATIONS °· Pain. °· Bleeding. °· Scarring. °· Infection. °BEFORE THE PROCEDURE  °  You may need to have an ultrasound or other imaging tests to see how large or deep your cystic structure is. Blood tests may also be used to determine if you have an infection or how severe the infection is. You may need to have a tetanus shot. °PROCEDURE  °The affected area  is cleaned with a cleaning fluid. The cyst area will then be numbed with a medicine (local anesthetic). A small incision will be made in the cystic structure. A syringe or catheter may be used to drain the contents of the cystic structure, or the contents may be squeezed out. The area will then be flushed with a cleansing solution. After cleansing the area, it is often gently packed with a gauze or another wound dressing. Once it is packed, it will be covered with gauze and tape or some other type of wound dressing.  °AFTER THE PROCEDURE  °· Often, you will be allowed to go home right after the procedure. °· You may be given antibiotic medicine to prevent or heal an infection. °· If the area was packed with gauze or some other wound dressing, you will likely need to come back in 1 to 2 days to get it removed. °· The area should heal in about 14 days. °  °This information is not intended to replace advice given to you by your health care provider. Make sure you discuss any questions you have with your health care provider. °  °Document Released: 10/05/2000 Document Revised: 10/11/2011 Document Reviewed: 06/06/2011 °Elsevier Interactive Patient Education ©2016 Elsevier Inc. ° °

## 2015-04-21 NOTE — ED Notes (Signed)
Patient verbalized understanding of d/c instructions and medications and denies further needs or questions at this time. VS stable, patient ambulatory with steady gait. Given packs of gauze for wound. Assisted to ED entrance in wheelchair.

## 2015-04-21 NOTE — ED Notes (Signed)
Attempted to waste remaining 25mcg fentanyl in Pyxis, and patient's name no longer shows up, nor does the message stating this RN has undocumented waste. Wasted 25mcg fentanyl in sink with Armen PickupEric Brewer, RN.

## 2015-04-21 NOTE — ED Notes (Signed)
Attempted to waste remaining 25mcg fentanyl in Pyxis, but patient's name no longer showing up, nor is the message stating this RN has undocumented waste. Wasted 25mcg fentanyl in sink with Armen PickupEric Brewer, RN at [phone].

## 2015-04-24 ENCOUNTER — Other Ambulatory Visit: Payer: Self-pay | Admitting: Family Medicine

## 2015-04-26 DIAGNOSIS — H5789 Other specified disorders of eye and adnexa: Secondary | ICD-10-CM

## 2015-04-26 HISTORY — DX: Other specified disorders of eye and adnexa: H57.89

## 2015-04-28 ENCOUNTER — Telehealth: Payer: Self-pay

## 2015-05-03 ENCOUNTER — Telehealth: Payer: Self-pay

## 2015-05-03 NOTE — Telephone Encounter (Signed)
Called pt to advise him that the office is closed tomorrow. Left message on mobile number listed advised him that the office is closed and to call back during office hours on Tuesday to reschedule.

## 2015-05-04 ENCOUNTER — Ambulatory Visit: Payer: Self-pay | Admitting: Neurology

## 2015-05-04 NOTE — Telephone Encounter (Signed)
Called pt again to get his appt rescheduled, no answer, already left one message yesterday.

## 2015-05-06 NOTE — Telephone Encounter (Signed)
Called pt again, still not available, left a message asking him to call me back. If pt calls back, please schedule him for a 30 minute appt. Even though he cannot afford CPAP, he should still be seen by Dr. Vickey Hugerohmeier. [facility] does not have any patient assistance for cpaps, unfortunately.

## 2015-05-06 NOTE — Telephone Encounter (Signed)
Called pt to discuss appt. No answer, left a message asking him to call me back.  I asked [facility] if there is a patient assistance program for cpaps. Waiting for their response.

## 2015-05-06 NOTE — Telephone Encounter (Addendum)
Pt called to r/s appt for cpap f/u. He states that he has not purchased a cpap because he cannot afford it. Does he need to keep appt? Please call and advise [phone]

## 2015-05-10 ENCOUNTER — Emergency Department (HOSPITAL_COMMUNITY): Payer: Self-pay

## 2015-05-10 ENCOUNTER — Emergency Department (HOSPITAL_COMMUNITY)
Admission: EM | Admit: 2015-05-10 | Discharge: 2015-05-11 | Disposition: A | Discharge status: Home / Self Care | Payer: Self-pay | Attending: Emergency Medicine | Admitting: Emergency Medicine

## 2015-05-10 ENCOUNTER — Encounter (HOSPITAL_COMMUNITY): Payer: Self-pay | Admitting: Emergency Medicine

## 2015-05-10 DIAGNOSIS — J45909 Unspecified asthma, uncomplicated: Secondary | ICD-10-CM | POA: Insufficient documentation

## 2015-05-10 DIAGNOSIS — Z880 Allergy status to penicillin: Secondary | ICD-10-CM | POA: Insufficient documentation

## 2015-05-10 DIAGNOSIS — F17210 Nicotine dependence, cigarettes, uncomplicated: Secondary | ICD-10-CM | POA: Insufficient documentation

## 2015-05-10 DIAGNOSIS — Z792 Long term (current) use of antibiotics: Secondary | ICD-10-CM | POA: Insufficient documentation

## 2015-05-10 DIAGNOSIS — E119 Type 2 diabetes mellitus without complications: Secondary | ICD-10-CM | POA: Insufficient documentation

## 2015-05-10 DIAGNOSIS — Z79899 Other long term (current) drug therapy: Secondary | ICD-10-CM | POA: Insufficient documentation

## 2015-05-10 DIAGNOSIS — Z7984 Long term (current) use of oral hypoglycemic drugs: Secondary | ICD-10-CM | POA: Insufficient documentation

## 2015-05-10 DIAGNOSIS — L03213 Periorbital cellulitis: Secondary | ICD-10-CM

## 2015-05-10 DIAGNOSIS — F319 Bipolar disorder, unspecified: Secondary | ICD-10-CM | POA: Insufficient documentation

## 2015-05-10 DIAGNOSIS — I10 Essential (primary) hypertension: Secondary | ICD-10-CM | POA: Insufficient documentation

## 2015-05-10 DIAGNOSIS — Z8669 Personal history of other diseases of the nervous system and sense organs: Secondary | ICD-10-CM | POA: Insufficient documentation

## 2015-05-10 LAB — CBC
HEMATOCRIT: 53.3 % — AB (ref 39.0–52.0)
Hemoglobin: 16.5 g/dL (ref 13.0–17.0)
MCH: 27.6 pg (ref 26.0–34.0)
MCHC: 31 g/dL (ref 30.0–36.0)
MCV: 89.3 fL (ref 78.0–100.0)
Platelets: 195 10*3/uL (ref 150–400)
RBC: 5.97 MIL/uL — ABNORMAL HIGH (ref 4.22–5.81)
RDW: 13.9 % (ref 11.5–15.5)
WBC: 6.6 10*3/uL (ref 4.0–10.5)

## 2015-05-10 LAB — I-STAT CHEM 8, ED
BUN: 14 mg/dL (ref 6–20)
CHLORIDE: 98 mmol/L — AB (ref 101–111)
Calcium, Ion: 1.17 mmol/L (ref 1.12–1.23)
Creatinine, Ser: 1.2 mg/dL (ref 0.61–1.24)
GLUCOSE: 100 mg/dL — AB (ref 65–99)
HCT: 57 % — ABNORMAL HIGH (ref 39.0–52.0)
Hemoglobin: 19.4 g/dL — ABNORMAL HIGH (ref 13.0–17.0)
POTASSIUM: 4.3 mmol/L (ref 3.5–5.1)
SODIUM: 143 mmol/L (ref 135–145)
TCO2: 42 mmol/L (ref 0–100)

## 2015-05-10 LAB — COMPREHENSIVE METABOLIC PANEL
ALT: 22 U/L (ref 17–63)
AST: 24 U/L (ref 15–41)
Albumin: 3.3 g/dL — ABNORMAL LOW (ref 3.5–5.0)
Alkaline Phosphatase: 81 U/L (ref 38–126)
Anion gap: 8 (ref 5–15)
BUN: 10 mg/dL (ref 6–20)
CHLORIDE: 102 mmol/L (ref 101–111)
CO2: 31 mmol/L (ref 22–32)
CREATININE: 1.15 mg/dL (ref 0.61–1.24)
Calcium: 9 mg/dL (ref 8.9–10.3)
GFR calc non Af Amer: >60 mL/min (ref >60)
Glucose, Bld: 105 mg/dL — ABNORMAL HIGH (ref 65–99)
Potassium: 4.4 mmol/L (ref 3.5–5.1)
SODIUM: 141 mmol/L (ref 135–145)
Total Bilirubin: 0.2 mg/dL — ABNORMAL LOW (ref 0.3–1.2)
Total Protein: 7.1 g/dL (ref 6.5–8.1)

## 2015-05-10 LAB — DIFFERENTIAL
BASOS ABS: 0 10*3/uL (ref 0.0–0.1)
BASOS PCT: 0 %
EOS ABS: 0.1 10*3/uL (ref 0.0–0.7)
Eosinophils Relative: 2 %
Lymphocytes Relative: 40 %
Lymphs Abs: 2.6 10*3/uL (ref 0.7–4.0)
MONO ABS: 0.5 10*3/uL (ref 0.1–1.0)
MONOS PCT: 7 %
Neutro Abs: 3.4 10*3/uL (ref 1.7–7.7)
Neutrophils Relative %: 51 %

## 2015-05-10 LAB — URINALYSIS, ROUTINE W REFLEX MICROSCOPIC
BILIRUBIN URINE: NEGATIVE
GLUCOSE, UA: NEGATIVE mg/dL
HGB URINE DIPSTICK: NEGATIVE
KETONES UR: NEGATIVE mg/dL
LEUKOCYTES UA: NEGATIVE
NITRITE: NEGATIVE
PROTEIN: NEGATIVE mg/dL
SPECIFIC GRAVITY, URINE: 1.025 (ref 1.005–1.030)
pH: 6 (ref 5.0–8.0)

## 2015-05-10 LAB — I-STAT TROPONIN, ED: Troponin i, poc: 0.02 ng/mL (ref 0.00–0.08)

## 2015-05-10 LAB — APTT: aPTT: 28 seconds (ref 24–37)

## 2015-05-10 LAB — PROTIME-INR
INR: 1.04 (ref 0.00–1.49)
PROTHROMBIN TIME: 13.8 s (ref 11.6–15.2)

## 2015-05-10 MED ORDER — SODIUM CHLORIDE 0.9 % IV BOLUS (SEPSIS)
1000.0000 mL | Freq: Once | INTRAVENOUS | Status: AC
  Administered 2015-05-10: 1000 mL via INTRAVENOUS

## 2015-05-10 MED ORDER — OXYCODONE HCL 10 MG PO TABS: 10.0000 mg | ORAL_TABLET | Freq: Four times a day (QID) | ORAL | Status: DC | PRN

## 2015-05-10 MED ORDER — FENTANYL CITRATE (PF) 100 MCG/2ML IJ SOLN
50.0000 ug | Freq: Once | INTRAMUSCULAR | Status: AC
  Administered 2015-05-10: 50 ug via INTRAVENOUS
  Filled 2015-05-10: qty 2

## 2015-05-10 MED ORDER — CLINDAMYCIN HCL 150 MG PO CAPS
300.0000 mg | ORAL_CAPSULE | Freq: Once | ORAL | Status: AC
  Administered 2015-05-11: 300 mg via ORAL
  Filled 2015-05-10: qty 2

## 2015-05-10 MED ORDER — ONDANSETRON HCL 4 MG/2ML IJ SOLN
4.0000 mg | Freq: Once | INTRAMUSCULAR | Status: AC
  Administered 2015-05-10: 4 mg via INTRAVENOUS
  Filled 2015-05-10: qty 2

## 2015-05-10 MED ORDER — IOHEXOL 300 MG/ML  SOLN
95.0000 mL | Freq: Once | INTRAMUSCULAR | Status: AC | PRN
  Administered 2015-05-10: 75 mL via INTRAVENOUS

## 2015-05-10 MED ORDER — CLINDAMYCIN HCL 150 MG PO CAPS: 300.0000 mg | ORAL_CAPSULE | Freq: Three times a day (TID) | ORAL | Status: DC

## 2015-05-10 NOTE — ED Notes (Signed)
Dr. Jeraldine LootsLockwood advised to discontinue EKG for pt and that it is not necessary.

## 2015-05-10 NOTE — Discharge Instructions (Signed)
As discussed, with concern for infection on the left side of her face and around the eye, you have been started on antibiotics.  It is important that you take these as directed, and be sure to follow-up with both your primary care physician, and ophthalmologist.  Return here for concerning changes in your condition.

## 2015-05-10 NOTE — ED Notes (Signed)
C/o L eye redness/swelling, blurred vision, unsteady gait, headache, and L sided weakness since Wednesday.

## 2015-05-10 NOTE — ED Provider Notes (Signed)
CSN: [id number]     Arrival date & time 05/10/15  1930 History   First MD Initiated Contact with Patient 05/10/15 2003     Chief Complaint  Patient presents with  . Gait Problem  . blurred vision   . eye redness   . Extremity Weakness     (Consider location/radiation/quality/duration/timing/severity/associated sxs/prior Treatment) HPI Patient presents with several concerns, but primarily pain about the left eye, headache. He acknowledges a history of diabetes, sleep apnea, narcolepsy. He states he takes medication as directed, but has no access to BiPAP at night. Over the past 3 days he developed increasing pain, swelling about the left eye, with erythema. No relief with topical erythromycin, nor OTC medication for pain. No confusion, disorientation, syncope.  Patient acknowledges a history of recurrent left eye problems. Initially the symptoms were mild, this episode, but have progressed to severe.   Past Medical History  Diagnosis Date  . Hypertension   . Sleep apnea   . Sleep apnea   . Narcolepsy   . PTSD (post-traumatic stress disorder)   . Bipolar disorder (HCC)   . Sleep apnea   . Diabetes mellitus without complication (HCC)   . Asthma    Past Surgical History  Procedure Laterality Date  . Tonsillectomy     Family History  Problem Relation Age of Onset  . Adopted: Yes  . Breast cancer Mother   . Bone cancer Father    Social History  Substance Use Topics  . Smoking status: Current Every Day Smoker -- 0.75 packs/day for 5 years    Types: Cigarettes  . Smokeless tobacco: None  . Alcohol Use: 0.0 oz/week    0 Standard drinks or equivalent per week     Comment: rarely    Review of Systems  Constitutional:       Per HPI, otherwise negative  HENT:       Per HPI, otherwise negative  Eyes: Positive for photophobia, pain, discharge, redness and visual disturbance.  Respiratory:       Per HPI, otherwise negative  Cardiovascular:       Per HPI, otherwise  negative  Gastrointestinal: Negative for vomiting.  Endocrine:       Negative aside from HPI  Genitourinary:       Neg aside from HPI   Musculoskeletal:       Per HPI, otherwise negative  Skin: Negative.   Neurological: Negative for syncope.      Allergies  Bee venom; Contrast media; Wasp venom; Penicillins; and Vicodin  Home Medications   Prior to Admission medications   Medication Sig Start Date End Date Taking? Authorizing Provider  ALPRAZolam Prudy Feeler(XANAX) 1 MG tablet Take 1 mg by mouth 3 (three) times daily as needed for anxiety.  02/25/15   Historical Provider, MD  cephALEXin (KEFLEX) 500 MG capsule Take 1 capsule (500 mg total) by mouth 4 (four) times daily. 04/21/15   Danelle BerryLeisa Tapia, PA-C  furosemide (LASIX) 20 MG tablet Take 1 tablet (20 mg total) by mouth 2 (two) times daily. 03/26/15   Thao P Le, DO  gabapentin (NEURONTIN) 300 MG capsule Take 1 capsule (300 mg total) by mouth 2 (two) times daily. Take 1 at bedtime for 1 week, then increase to twice a day. 03/25/15   Emi Belfasteborah B Gessner, FNP  lisinopril (PRINIVIL,ZESTRIL) 5 MG tablet Take 1 tablet (5 mg total) by mouth daily. 03/26/15   Emi Belfasteborah B Gessner, FNP  meloxicam (MOBIC) 15 MG tablet Take 15 mg by mouth daily  as needed for pain.    Historical Provider, MD  metFORMIN (GLUCOPHAGE) 500 MG tablet TAKE 1 TABLET BY MOUTH TWICE A DAY WITH MEALS 04/24/15   Emi Belfast, FNP  Oxycodone HCl 10 MG TABS Take 10 mg by mouth every 6 (six) hours as needed (pain).    Historical Provider, MD  oxyCODONE-acetaminophen (PERCOCET) 5-325 MG tablet Take 1 tablet by mouth every 6 (six) hours as needed for severe pain. 04/21/15   Danelle Berry, PA-C   BP 150/81 mmHg  Pulse 83  Temp(Src) 98.2 F (36.8 C) (Oral)  Resp 20  Ht 5\' 9"  (1.753 m)  Wt 365 lb (165.563 kg)  BMI 53.88 kg/m2  SpO2 94% Physical Exam  Constitutional: He is oriented to person, place, and time. He appears well-developed. No distress.  HENT:  Head: Normocephalic and  atraumatic.  Eyes: Left eye exhibits chemosis and discharge. Right conjunctiva is not injected. Right conjunctiva has no hemorrhage. Left conjunctiva is injected. Left eye exhibits abnormal extraocular motion and nystagmus. Right pupil is reactive. Left pupil is reactive.  There is gross proptosis of the left eye, tenderness to palpation throughout the area.  Cardiovascular: Normal rate and regular rhythm.   Pulmonary/Chest: Effort normal. No stridor. No respiratory distress.  Abdominal: He exhibits no distension.  Musculoskeletal: He exhibits no edema.  Neurological: He is alert and oriented to person, place, and time. He displays no atrophy and no tremor. No cranial nerve deficit or sensory deficit. He exhibits normal muscle tone. He displays no seizure activity.  Skin: Skin is warm and dry.  Psychiatric: He has a normal mood and affect.  Nursing note and vitals reviewed.   ED Course  Procedures (including critical care time) Labs Review Labs Reviewed  CBC - Abnormal; Notable for the following:    RBC 5.97 (*)    HCT 53.3 (*)    All other components within normal limits  COMPREHENSIVE METABOLIC PANEL - Abnormal; Notable for the following:    Glucose, Bld 105 (*)    Albumin 3.3 (*)    Total Bilirubin 0.2 (*)    All other components within normal limits  I-STAT CHEM 8, ED - Abnormal; Notable for the following:    Chloride 98 (*)    Glucose, Bld 100 (*)    Hemoglobin 19.4 (*)    HCT 57.0 (*)    All other components within normal limits  PROTIME-INR  APTT  DIFFERENTIAL  URINALYSIS, ROUTINE W REFLEX MICROSCOPIC (NOT AT [facility])  Rosezena Sensor, ED    Imaging Review Ct Head Wo Contrast  05/10/2015  CLINICAL DATA:  35 year old male with left eye redness and swelling and blurred vision. Left-sided proptosis. EXAM: CT HEAD without contrast. CT  ORBITS WITH CONTRAST TECHNIQUE: Contiguous axial images were obtained from the base of the skull through the vertex without contrast.  Multidetector CT imaging of the orbits was performed using the standard protocol with intravenous contrast. CONTRAST:  75mL OMNIPAQUE IOHEXOL 300 MG/ML  SOLN COMPARISON:  Head CT dated 09/06/2007 FINDINGS: CT HEAD FINDINGS The ventricles and the sulci are appropriate in size for the patient's age. There is no intracranial hemorrhage. No midline shift or mass effect identified. The gray-white matter differentiation is preserved. The visualized paranasal sinuses and mastoid air cells are well aerated. The calvarium is intact. CT ORBITS FINDINGS The globes, retro-orbital fat, and orbital wall are preserved. The ocular musculature appear intact. There is dysconjugate gaze which may be related to strabismus. Clinical correlation is recommended the There  is no fluid collection. The visualized osseous structures appear unremarkable. The paranasal sinuses are well aerated. IMPRESSION: No acute intracranial pathology. No acute of male orbital pathology. Electronically Signed   By: Elgie Collard M.D.   On: 05/10/2015 22:47   Ct Orbits W/cm  05/10/2015  CLINICAL DATA:  35 year old male with left eye redness and swelling and blurred vision. Left-sided proptosis. EXAM: CT HEAD without contrast. CT  ORBITS WITH CONTRAST TECHNIQUE: Contiguous axial images were obtained from the base of the skull through the vertex without contrast. Multidetector CT imaging of the orbits was performed using the standard protocol with intravenous contrast. CONTRAST:  75mL OMNIPAQUE IOHEXOL 300 MG/ML  SOLN COMPARISON:  Head CT dated 09/06/2007 FINDINGS: CT HEAD FINDINGS The ventricles and the sulci are appropriate in size for the patient's age. There is no intracranial hemorrhage. No midline shift or mass effect identified. The gray-white matter differentiation is preserved. The visualized paranasal sinuses and mastoid air cells are well aerated. The calvarium is intact. CT ORBITS FINDINGS The globes, retro-orbital fat, and orbital wall are  preserved. The ocular musculature appear intact. There is dysconjugate gaze which may be related to strabismus. Clinical correlation is recommended the There is no fluid collection. The visualized osseous structures appear unremarkable. The paranasal sinuses are well aerated. IMPRESSION: No acute intracranial pathology. No acute of male orbital pathology. Electronically Signed   By: Elgie Collard M.D.   On: 05/10/2015 22:47   I have personally reviewed and evaluated these images and lab results as part of my medical decision-making.  Chart review notable for multiple recent neurology evaluation visits, mostly concerning sleep apnea, narcolepsy.   11:43 PM Patient sleeping on repeat exam. He does have mild desaturation, into the upper 80% range. But he has no sleep apnea, is not using any supplemental oxygen currently.  MDM  Patient presents with concern of ongoing discomfort about his eye, and is found to have erythematous changes, concerning for a septal cellulitis. No evidence for orbital cellulitis on CT scan, no intracranial lesions. Patient proved care, was sleeping after provision of analgesia, fluids. With concern for infection, patient started on antibiotics. Patient will follow-up with primary care, ophthalmology.   Gerhard Munch, MD 05/10/15 [phone]

## 2015-05-10 NOTE — ED Notes (Signed)
Pt given urinal to provide urine sample when able.

## 2015-05-11 ENCOUNTER — Telehealth: Payer: Self-pay | Admitting: *Deleted

## 2015-05-11 NOTE — Telephone Encounter (Signed)
Pharmacy called related to Rx: Oxycodone HCl 10 MG TABS 10 mg, Every 6 hours PRN 10 tablets...EDCM clarified with EDP to: not fill because pharmacy reports that pt filled same Rx for 30 tabs on 05/06/14.

## 2015-05-13 ENCOUNTER — Inpatient Hospital Stay (HOSPITAL_COMMUNITY): Payer: Self-pay

## 2015-05-13 ENCOUNTER — Inpatient Hospital Stay (HOSPITAL_COMMUNITY)
Admission: EM | Admit: 2015-05-13 | Discharge: 2015-05-17 | DRG: 121 | Disposition: A | Discharge status: Home / Self Care | Payer: Self-pay | Attending: Emergency Medicine | Admitting: Emergency Medicine

## 2015-05-13 ENCOUNTER — Encounter (HOSPITAL_COMMUNITY): Payer: Self-pay | Admitting: Emergency Medicine

## 2015-05-13 DIAGNOSIS — F17210 Nicotine dependence, cigarettes, uncomplicated: Secondary | ICD-10-CM | POA: Diagnosis present

## 2015-05-13 DIAGNOSIS — F4310 Post-traumatic stress disorder, unspecified: Secondary | ICD-10-CM | POA: Diagnosis present

## 2015-05-13 DIAGNOSIS — M25569 Pain in unspecified knee: Secondary | ICD-10-CM

## 2015-05-13 DIAGNOSIS — Z880 Allergy status to penicillin: Secondary | ICD-10-CM

## 2015-05-13 DIAGNOSIS — F332 Major depressive disorder, recurrent severe without psychotic features: Secondary | ICD-10-CM | POA: Diagnosis present

## 2015-05-13 DIAGNOSIS — Z808 Family history of malignant neoplasm of other organs or systems: Secondary | ICD-10-CM

## 2015-05-13 DIAGNOSIS — E119 Type 2 diabetes mellitus without complications: Secondary | ICD-10-CM

## 2015-05-13 DIAGNOSIS — E1142 Type 2 diabetes mellitus with diabetic polyneuropathy: Secondary | ICD-10-CM | POA: Diagnosis present

## 2015-05-13 DIAGNOSIS — G8929 Other chronic pain: Secondary | ICD-10-CM | POA: Diagnosis present

## 2015-05-13 DIAGNOSIS — I10 Essential (primary) hypertension: Secondary | ICD-10-CM | POA: Diagnosis present

## 2015-05-13 DIAGNOSIS — H05012 Cellulitis of left orbit: Principal | ICD-10-CM | POA: Diagnosis present

## 2015-05-13 DIAGNOSIS — M25562 Pain in left knee: Secondary | ICD-10-CM | POA: Diagnosis present

## 2015-05-13 DIAGNOSIS — Z91041 Radiographic dye allergy status: Secondary | ICD-10-CM

## 2015-05-13 DIAGNOSIS — Z79891 Long term (current) use of opiate analgesic: Secondary | ICD-10-CM

## 2015-05-13 DIAGNOSIS — M1712 Unilateral primary osteoarthritis, left knee: Secondary | ICD-10-CM | POA: Diagnosis present

## 2015-05-13 DIAGNOSIS — H0520 Unspecified exophthalmos: Secondary | ICD-10-CM | POA: Diagnosis present

## 2015-05-13 DIAGNOSIS — G47419 Narcolepsy without cataplexy: Secondary | ICD-10-CM | POA: Diagnosis present

## 2015-05-13 DIAGNOSIS — Z6841 Body Mass Index (BMI) 40.0 and over, adult: Secondary | ICD-10-CM

## 2015-05-13 DIAGNOSIS — G4733 Obstructive sleep apnea (adult) (pediatric): Secondary | ICD-10-CM | POA: Diagnosis present

## 2015-05-13 DIAGNOSIS — Z7984 Long term (current) use of oral hypoglycemic drugs: Secondary | ICD-10-CM

## 2015-05-13 DIAGNOSIS — R0600 Dyspnea, unspecified: Secondary | ICD-10-CM

## 2015-05-13 DIAGNOSIS — H547 Unspecified visual loss: Secondary | ICD-10-CM | POA: Insufficient documentation

## 2015-05-13 DIAGNOSIS — T380X5A Adverse effect of glucocorticoids and synthetic analogues, initial encounter: Secondary | ICD-10-CM | POA: Diagnosis present

## 2015-05-13 DIAGNOSIS — H5789 Other specified disorders of eye and adnexa: Secondary | ICD-10-CM | POA: Insufficient documentation

## 2015-05-13 HISTORY — DX: Other specified disorders of eye and adnexa: H57.89

## 2015-05-13 LAB — CBC WITH DIFFERENTIAL/PLATELET
BASOS ABS: 0 10*3/uL (ref 0.0–0.1)
BASOS PCT: 0 %
EOS ABS: 0.2 10*3/uL (ref 0.0–0.7)
EOS PCT: 3 %
HCT: 51.8 % (ref 39.0–52.0)
Hemoglobin: 16 g/dL (ref 13.0–17.0)
LYMPHS PCT: 31 %
Lymphs Abs: 1.8 10*3/uL (ref 0.7–4.0)
MCH: 27.4 pg (ref 26.0–34.0)
MCHC: 30.9 g/dL (ref 30.0–36.0)
MCV: 88.9 fL (ref 78.0–100.0)
MONO ABS: 0.5 10*3/uL (ref 0.1–1.0)
Monocytes Relative: 9 %
Neutro Abs: 3.2 10*3/uL (ref 1.7–7.7)
Neutrophils Relative %: 57 %
PLATELETS: 179 10*3/uL (ref 150–400)
RBC: 5.83 MIL/uL — AB (ref 4.22–5.81)
RDW: 14.1 % (ref 11.5–15.5)
WBC: 5.7 10*3/uL (ref 4.0–10.5)

## 2015-05-13 LAB — BASIC METABOLIC PANEL
ANION GAP: 8 (ref 5–15)
BUN: 11 mg/dL (ref 6–20)
CALCIUM: 9 mg/dL (ref 8.9–10.3)
CO2: 32 mmol/L (ref 22–32)
Chloride: 100 mmol/L — ABNORMAL LOW (ref 101–111)
Creatinine, Ser: 1.16 mg/dL (ref 0.61–1.24)
GFR calc Af Amer: >60 mL/min (ref >60)
GLUCOSE: 191 mg/dL — AB (ref 65–99)
POTASSIUM: 4.7 mmol/L (ref 3.5–5.1)
SODIUM: 140 mmol/L (ref 135–145)

## 2015-05-13 LAB — GLUCOSE, CAPILLARY: Glucose-Capillary: 181 mg/dL — ABNORMAL HIGH (ref 65–99)

## 2015-05-13 LAB — I-STAT CG4 LACTIC ACID, ED: LACTIC ACID, VENOUS: 0.9 mmol/L (ref 0.5–2.0)

## 2015-05-13 LAB — CBG MONITORING, ED: Glucose-Capillary: 149 mg/dL — ABNORMAL HIGH (ref 65–99)

## 2015-05-13 LAB — TSH: TSH: 2.125 u[IU]/mL (ref 0.350–4.500)

## 2015-05-13 MED ORDER — WHITE PETROLATUM GEL
Freq: Four times a day (QID) | Status: DC
  Administered 2015-05-13 – 2015-05-14 (×3): 0.2 via TOPICAL
  Filled 2015-05-13: qty 28.35
  Filled 2015-05-13 (×2): qty 1

## 2015-05-13 MED ORDER — FLUORESCEIN SODIUM 1 MG OP STRP
1.0000 | ORAL_STRIP | Freq: Once | OPHTHALMIC | Status: AC
  Administered 2015-05-13: 1 via OPHTHALMIC
  Filled 2015-05-13: qty 1

## 2015-05-13 MED ORDER — ALBUTEROL SULFATE (2.5 MG/3ML) 0.083% IN NEBU
2.5000 mg | INHALATION_SOLUTION | Freq: Four times a day (QID) | RESPIRATORY_TRACT | Status: DC | PRN
Start: 2015-05-13 — End: 2015-05-17

## 2015-05-13 MED ORDER — DEXTROSE 5 % IV SOLN
2.0000 g | Freq: Once | INTRAVENOUS | Status: AC
  Administered 2015-05-13: 2 g via INTRAVENOUS
  Filled 2015-05-13: qty 2

## 2015-05-13 MED ORDER — GADOBENATE DIMEGLUMINE 529 MG/ML IV SOLN
20.0000 mL | Freq: Once | INTRAVENOUS | Status: AC
  Administered 2015-05-13: 20 mL via INTRAVENOUS

## 2015-05-13 MED ORDER — OXYCODONE HCL 5 MG PO TABS: 10.0000 mg | ORAL_TABLET | Freq: Four times a day (QID) | ORAL | Status: DC | PRN

## 2015-05-13 MED ORDER — METHYLPREDNISOLONE SODIUM SUCC 125 MG IJ SOLR: 125.0000 mg | Freq: Once | INTRAMUSCULAR | Status: DC

## 2015-05-13 MED ORDER — SODIUM CHLORIDE 0.9 % IV SOLN
1000.0000 mg | Freq: Once | INTRAVENOUS | Status: AC
  Administered 2015-05-14: 1000 mg via INTRAVENOUS
  Filled 2015-05-13 (×2): qty 8

## 2015-05-13 MED ORDER — OXYCODONE HCL 5 MG PO TABS
5.0000 mg | ORAL_TABLET | Freq: Four times a day (QID) | ORAL | Status: DC | PRN
Start: 2015-05-13 — End: 2015-05-17
  Administered 2015-05-13 – 2015-05-17 (×10): 5 mg via ORAL
  Filled 2015-05-13 (×11): qty 1

## 2015-05-13 MED ORDER — LISINOPRIL 5 MG PO TABS
5.0000 mg | ORAL_TABLET | Freq: Every day | ORAL | Status: DC
Start: 2015-05-13 — End: 2015-05-17
  Administered 2015-05-13 – 2015-05-17 (×5): 5 mg via ORAL
  Filled 2015-05-13 (×5): qty 1

## 2015-05-13 MED ORDER — GABAPENTIN 300 MG PO CAPS
300.0000 mg | ORAL_CAPSULE | Freq: Two times a day (BID) | ORAL | Status: DC
  Administered 2015-05-13 – 2015-05-17 (×9): 300 mg via ORAL
  Filled 2015-05-13 (×9): qty 1

## 2015-05-13 MED ORDER — VANCOMYCIN HCL 10 G IV SOLR
1250.0000 mg | Freq: Two times a day (BID) | INTRAVENOUS | Status: DC
  Administered 2015-05-14 – 2015-05-16 (×5): 1250 mg via INTRAVENOUS
  Filled 2015-05-13 (×7): qty 1250

## 2015-05-13 MED ORDER — INSULIN ASPART 100 UNIT/ML ~~LOC~~ SOLN
0.0000 [IU] | Freq: Three times a day (TID) | SUBCUTANEOUS | Status: DC
Start: 2015-05-13 — End: 2015-05-15
  Administered 2015-05-13: 1 [IU] via SUBCUTANEOUS
  Administered 2015-05-14: 2 [IU] via SUBCUTANEOUS
  Administered 2015-05-14: 1 [IU] via SUBCUTANEOUS
  Administered 2015-05-14: 7 [IU] via SUBCUTANEOUS
  Filled 2015-05-13: qty 1

## 2015-05-13 MED ORDER — VANCOMYCIN HCL 10 G IV SOLR
2500.0000 mg | Freq: Once | INTRAVENOUS | Status: AC
  Administered 2015-05-13: 2500 mg via INTRAVENOUS
  Filled 2015-05-13: qty 2500

## 2015-05-13 MED ORDER — ALBUTEROL SULFATE HFA 108 (90 BASE) MCG/ACT IN AERS: 1.0000 | INHALATION_SPRAY | Freq: Four times a day (QID) | RESPIRATORY_TRACT | Status: DC | PRN

## 2015-05-13 MED ORDER — PROPARACAINE HCL 0.5 % OP SOLN
1.0000 [drp] | Freq: Once | OPHTHALMIC | Status: AC
  Administered 2015-05-13: 1 [drp] via OPHTHALMIC
  Filled 2015-05-13: qty 15

## 2015-05-13 MED ORDER — ENOXAPARIN SODIUM 40 MG/0.4ML ~~LOC~~ SOLN
40.0000 mg | SUBCUTANEOUS | Status: DC
  Administered 2015-05-13: 40 mg via SUBCUTANEOUS
  Filled 2015-05-13 (×2): qty 0.4

## 2015-05-13 MED ORDER — NICOTINE 7 MG/24HR TD PT24
7.0000 mg | MEDICATED_PATCH | Freq: Once | TRANSDERMAL | Status: AC
  Administered 2015-05-13: 7 mg via TRANSDERMAL
  Filled 2015-05-13: qty 1

## 2015-05-13 NOTE — ED Notes (Signed)
MD at bedside. 

## 2015-05-13 NOTE — ED Notes (Signed)
Gave patient ice chips. 

## 2015-05-13 NOTE — ED Provider Notes (Signed)
CSN: [id number]     Arrival date & time 05/13/15  4098 History   First MD Initiated Contact with Patient 05/13/15 0920     Chief Complaint  Patient presents with  . Eye Problem     (Consider location/radiation/quality/duration/timing/severity/associated sxs/prior Treatment) HPI Comments: Lavin Petteway is a 35 y.o. M with h/o T2DM, HTN, OSA, bipolar disorder presenting for eye pain and redness.  Redness and swelling initially started with some swelling at corner of L eye 1 wk ago.  Patient was seen in ED 3 days ago, had orbital CT negative for orbital cellulitis, and was diagnosed with pre-septal cellulitis.  He returns to ED today after taking 3 days of PO clindamycin and having continued worsening of swelling, pain and redness.  Have subjective fevers and chills throughout the night last night.  He is now unable to close his left eyelid. He has continuous severe pain that oxycodone only dampens. He reports his vision has also worsened over the last 3 days and now he can only see blurred objects. He has photophobia and pain with eye movement. Reports good compliance of this clindamycin.  Does not wear contacts.  Denies recent injury to L eye.  Reports similar symptoms about 2 years ago and reports Optho was unsure of what was wrong at that time.    Past Medical History  Diagnosis Date  . Hypertension   . Sleep apnea   . Sleep apnea   . Narcolepsy   . PTSD (post-traumatic stress disorder)   . Bipolar disorder (HCC)   . Sleep apnea   . Diabetes mellitus without complication (HCC)   . Asthma    Past Surgical History  Procedure Laterality Date  . Tonsillectomy     Family History  Problem Relation Age of Onset  . Adopted: Yes  . Breast cancer Mother   . Bone cancer Father    Social History  Substance Use Topics  . Smoking status: Current Every Day Smoker -- 0.75 packs/day for 5 years    Types: Cigarettes  . Smokeless tobacco: None  . Alcohol Use: 0.0 oz/week    0 Standard  drinks or equivalent per week     Comment: socially    Review of Systems  Constitutional: Positive for fever and chills.  Eyes: Positive for photophobia, pain, redness and visual disturbance.  All other systems reviewed and are negative.     Allergies  Bee venom; Contrast media; Wasp venom; Penicillins; and Vicodin  Home Medications   Prior to Admission medications   Medication Sig Start Date End Date Taking? Authorizing Provider  ALPRAZolam Prudy Feeler) 1 MG tablet Take 1 mg by mouth 3 (three) times daily as needed for anxiety.  02/25/15   Historical Provider, MD  clindamycin (CLEOCIN) 150 MG capsule Take 2 capsules (300 mg total) by mouth 3 (three) times daily. 05/10/15 05/17/15  Gerhard Munch, MD  erythromycin ophthalmic ointment Place 1 application into both eyes 4 (four) times daily. Until infection clears up    Historical Provider, MD  Fluticasone Propionate HFA (FLOVENT HFA IN) Inhale 2 puffs into the lungs every 6 (six) hours as needed (shortness of breath/wheezing).    Historical Provider, MD  furosemide (LASIX) 20 MG tablet Take 1 tablet (20 mg total) by mouth 2 (two) times daily. 03/26/15   Thao P Le, DO  gabapentin (NEURONTIN) 300 MG capsule Take 1 capsule (300 mg total) by mouth 2 (two) times daily. Take 1 at bedtime for 1 week, then increase to twice a  day. Patient taking differently: Take 300 mg by mouth 2 (two) times daily.  03/25/15   Emi Belfast, FNP  lisinopril (PRINIVIL,ZESTRIL) 5 MG tablet Take 1 tablet (5 mg total) by mouth daily. 03/26/15   Emi Belfast, FNP  Menthol, Topical Analgesic, (ICY HOT EX) Apply 1 application topically daily as needed (pain).    Historical Provider, MD  metFORMIN (GLUCOPHAGE) 500 MG tablet TAKE 1 TABLET BY MOUTH TWICE A DAY WITH MEALS Patient taking differently: Take 500 mg by mouth 2 (two) times daily with a meal.  04/24/15   Emi Belfast, FNP  Oxycodone HCl 10 MG TABS Take 1 tablet (10 mg total) by mouth every 6 (six) hours as  needed (pain). 05/10/15   Gerhard Munch, MD  Pirbuterol Acetate (MAXAIR AUTOHALER IN) Inhale 2 puffs into the lungs every 6 (six) hours as needed (shortness of breath/wheezing).    Historical Provider, MD   BP 155/110 mmHg  Pulse 103  Temp(Src) 98.8 F (37.1 C) (Oral)  Resp 24  SpO2 96% Physical Exam  Constitutional: He is oriented to person, place, and time. He appears well-developed and well-nourished.  HENT:  Head: Normocephalic and atraumatic.  Eyes: Pupils are equal, round, and reactive to light. Right eye exhibits no chemosis. Left eye exhibits chemosis and discharge (watery, non purulent). Left conjunctiva is injected. No scleral icterus. Right eye exhibits normal extraocular motion. Left eye exhibits abnormal extraocular motion (decreased and painful EOM in L eye).  Fluorescein staining without any distinct areas, intraocular pressure 18   Neck: Neck supple.  Cardiovascular: Normal rate and regular rhythm.   Pulmonary/Chest: Effort normal and breath sounds normal. No respiratory distress.  Musculoskeletal: He exhibits no edema or tenderness.  Lymphadenopathy:    He has no cervical adenopathy.  Neurological: He is alert and oriented to person, place, and time.  Skin: Skin is warm and dry. No rash noted.  Psychiatric: He has a normal mood and affect. His behavior is normal.    ED Course  Procedures (including critical care time) Labs Review Labs Reviewed  CBC WITH DIFFERENTIAL/PLATELET - Abnormal; Notable for the following:    RBC 5.83 (*)    All other components within normal limits  CULTURE, BLOOD (ROUTINE X 2)  CULTURE, BLOOD (ROUTINE X 2)  BASIC METABOLIC PANEL  I-STAT CG4 LACTIC ACID, ED    Imaging Review No results found. I have personally reviewed and evaluated these images and lab results as part of my medical decision-making.   EKG Interpretation None      MDM   Final diagnoses:  Eye swelling  Decreased vision    35 y.o. M with T2DM presenting  with worsening left eye pain, swelling, redness, vision. Findings on exam concerning for possible orbital cellulitis. Recent head CT negative for orbital cellulitis and sinusitis is clear. No need for ENT consult. Discussed with ophthalmology who agrees this is likely orbital cellulitis. Would like repeat head CT with contrast, but patient with contrast allergy. We will obtain MRI orbits with contrast. Basic labs and blood cultures pending and patient started on IV vancomycin and ceftriaxone. Patient to be admitted to internal medicine service for IV antibiotics, and ophthalmology will see him later today.    Erasmo Downer, MD 05/13/15 1048  Blane Ohara, MD 05/13/15 [phone]

## 2015-05-13 NOTE — Progress Notes (Signed)
ANTIBIOTIC CONSULT NOTE - INITIAL  Pharmacy Consult for vancomycin Indication: orbital cellulitis  Allergies  Allergen Reactions  . Bee Venom Anaphylaxis  . Contrast Media [Iodinated Diagnostic Agents] Nausea And Vomiting       . Wasp Venom Anaphylaxis  . Cherry Other (See Comments)    unknown  . Penicillins Other (See Comments)    Childhood allergic reaction - no other information available  . Vicodin [Hydrocodone-Acetaminophen] Diarrhea    Patient Measurements:   Adjusted Body Weight:   Vital Signs: Temp: 98.8 F (37.1 C) (01/18 0859) Temp Source: Oral (01/18 0859) BP: 145/78 mmHg (01/18 1256) Pulse Rate: 102 (01/18 1215) Intake/Output from previous day:   Intake/Output from this shift:    Labs:  Recent Labs  05/10/15 2006 05/10/15 2015 05/13/15 0920  WBC 6.6  --  5.7  HGB 16.5 19.4* 16.0  PLT 195  --  179  CREATININE 1.15 1.20 1.16   Estimated Creatinine Clearance: 136.7 mL/min (by C-G formula based on Cr of 1.16). No results for input(s): VANCOTROUGH, VANCOPEAK, VANCORANDOM, GENTTROUGH, GENTPEAK, GENTRANDOM, TOBRATROUGH, TOBRAPEAK, TOBRARND, AMIKACINPEAK, AMIKACINTROU, AMIKACIN in the last 72 hours.   Microbiology: No results found for this or any previous visit (from the past 720 hour(s)).  Medical History: Past Medical History  Diagnosis Date  . Hypertension   . Sleep apnea   . Sleep apnea   . Narcolepsy   . PTSD (post-traumatic stress disorder)   . Bipolar disorder (HCC)   . Sleep apnea   . Diabetes mellitus without complication (HCC)   . Asthma     Assessment: 34 yom with eye pain/swelling. Was given clindamycin 3 days ago and d/c'd. Pharmacy consulted to dose vancomycin for orbital cellulitis + CTX per MD. Vanc load already given in the ED. Afeb, wbc wnl. SCR 1.16, normalized CrCl~90.  1/18 vanc>> 1/18 CTX>>  1/18 BCx2>>  Goal of Therapy:  Vancomycin trough level 10-15 mcg/ml  Plan:  Vanc 2.5g x1 given in ED; then Vanc  IV  q12h CTX 2g IV q24h per MD Monitor clinical progress, c/s, renal function, abx plan/LOT VT@SS   Babs Bertin, PharmD, [facility] Clinical Pharmacist Pager [phone] 05/13/2015 1:46 PM

## 2015-05-13 NOTE — ED Notes (Signed)
Patient transported to MRI 

## 2015-05-13 NOTE — Care Management Note (Signed)
Case Management Note  Patient Details  Name: Devontae Casasola MRN: [id number] Date of Birth: Mar 25, 1981  Subjective/Objective:         Patient presented to [facility] ED with complaints of eye pain and swelling that started ~1 week ago,           Action/Plan: CM noted that patient is without health insurance,  CM met with patient at bedside, patient reports being without health insurance. Patient states, he is homeless and is active with the [facility] followed Marliss Coots NP  [phone].  Patient may need medication assistance, upon discharge.  Discussed follow up care with patient, he states, he will follow up at [facility]. CM will continue to follow for discharge needs.  Expected Discharge Date:                  Expected Discharge Plan:  Homeless Shelter  In-House Referral:     Discharge planning Services  CM Consult, [facility] [facility], [facility]  Post Acute Care Choice:    Choice offered to:     DME Arranged:    DME Agency:     HH Arranged:    HH Agency:     Status of Service:    In process CM will continue to follow  Medicare Important Message Given:    Date Medicare IM Given:    Medicare IM give by:    Date Additional Medicare IM Given:    Additional Medicare Important Message give by:     If discussed at [place] of Stay Meetings, dates discussed:    Additional CommentsLaurena Slimmer, RN 05/13/2015, 7:47 PM

## 2015-05-13 NOTE — ED Notes (Signed)
Checked patient blood sugar it was 149 notified RN of blood sugar

## 2015-05-13 NOTE — H&P (Signed)
Date: 05/13/2015               Patient Name:  Kenneth Mcfarland MRN: [id number]  DOB: 02/13/1981 Age / Sex: 35 y.o., male   PCP: Lavinia Sharps, NP         Medical Service: Internal Medicine Teaching Service         Attending Physician: Dr. Blane Ohara, MD    First Contact: Dr. Earlene Plater Pager: [phone]  Second Contact: Dr. Beckie Salts Pager: [phone]       After Hours (After 5p/  First Contact Pager: [phone]  weekends / holidays): Second Contact Pager: [phone]   Chief Complaint: Eye pain and swelling  History of Present Illness: pt si a 34 Y O M with PMH of Morbid obesity, HTN, DM, Chronic pain- left knee arthritis, OSA, Narcolepsy, presented today with complaints of eye pain and swelling that started ~1 week ago, he went to see his primary care provider and was given some eye ointments. With persistent symptoms he presented to the ED- 1/15 on Sunday, with eye redness, eye pain and swelling, he had head CT done, which was negative for orbital cellulitis, he was diagnosed with preseptal cellulitis, given a dose of antibiotic , and sent home with a prescription of clindamycin which he filled appropriately and took, for 2 days. Pt pt says he continued to have worsening pain, swelling, and redness, also with pain with eye movements, and discharge, greenish in colour, over the next 2 days. Pt also says he had increased pressure  Behind his eyes- when he strained or bent down to pick something, increased sensitivity to light, also an abnormal swelling under his eye- started 2 days ago, hence he presented to the Ed today. Pt also endorses fever- feels he was burning up and chills, feeling cold and clammy, yesterday, he did not check his temperature.   He denies recent eye trauma- had a scratched corneal  Late 90s, and some remote retinal trauma when he was a kid, he denies tooth pain, foreign body in his eyes, prior eye surgery, or infection on hi sface. He says he has had exact same symptoms in the past-  2-3 years ago, for which he was admitted for 2 days at [place] and then transferred to [facility] for another 2 days, he was placed on antibiotics, but says the exact cause was never identified. Per Care everywhere, there appears to be an encounter in 2014, but i cannot see details of encounter, but Ct head with and without contrast- impression was left peri-orbital cellulitis- 02/2013.  Meds: Current Facility-Administered Medications  Medication Dose Route Frequency Provider Last Rate Last Dose  . albuterol (PROVENTIL HFA;VENTOLIN HFA) 108 (90 Base) MCG/ACT inhaler 1-2 puff  1-2 puff Inhalation Q6H PRN Ejiroghene E Emokpae, MD      . cefTRIAXone (ROCEPHIN) 2 g in dextrose 5 % 50 mL IVPB  2 g Intravenous Once Erasmo Downer, MD      . enoxaparin (LOVENOX) injection 40 mg  40 mg Subcutaneous Q24H Ejiroghene E Emokpae, MD      . gabapentin (NEURONTIN) capsule 300 mg  300 mg Oral BID Ejiroghene E Emokpae, MD   300 mg at 05/13/15 1255  . insulin aspart (novoLOG) injection 0-9 Units  0-9 Units Subcutaneous TID WC Ejiroghene E Emokpae, MD      . lisinopril (PRINIVIL,ZESTRIL) tablet 5 mg  5 mg Oral Daily Ejiroghene E Emokpae, MD   5 mg at 05/13/15 1256  . nicotine (NICODERM  CQ - dosed in mg/24 hr) patch 7 mg  7 mg Transdermal Once Blane Ohara, MD   7 mg at 05/13/15 1205  . oxyCODONE (Oxy IR/ROXICODONE) immediate release tablet 5 mg  5 mg Oral Q6H PRN Ejiroghene Wendall Stade, MD      . Melene Muller ON 05/14/2015] vancomycin (VANCOCIN) 1,250 mg in sodium chloride 0.9 % 250 mL IVPB  1,250 mg Intravenous Q12H Almon Hercules, [facility]       Current Outpatient Prescriptions  Medication Sig Dispense Refill  . ALPRAZolam (XANAX) 1 MG tablet Take 1 mg by mouth 3 (three) times daily as needed for anxiety.   2  . furosemide (LASIX) 20 MG tablet Take 1 tablet (20 mg total) by mouth 2 (two) times daily. 180 tablet 1  . gabapentin (NEURONTIN) 300 MG capsule Take 1 capsule (300 mg total) by mouth 2 (two) times daily. Take 1  at bedtime for 1 week, then increase to twice a day. (Patient taking differently: Take 300 mg by mouth 2 (two) times daily. ) 90 capsule 3  . lisinopril (PRINIVIL,ZESTRIL) 5 MG tablet Take 1 tablet (5 mg total) by mouth daily. 90 tablet 1  . metFORMIN (GLUCOPHAGE) 500 MG tablet TAKE 1 TABLET BY MOUTH TWICE A DAY WITH MEALS (Patient taking differently: Take 500 mg by mouth 2 (two) times daily with a meal. ) 180 tablet 0  . Oxycodone HCl 10 MG TABS Take 1 tablet (10 mg total) by mouth every 6 (six) hours as needed (pain). 10 tablet 0  . Pirbuterol Acetate (MAXAIR AUTOHALER IN) Inhale 2 puffs into the lungs every 6 (six) hours as needed (shortness of breath/wheezing).      Allergies: Allergies as of 05/13/2015 - Review Complete 05/13/2015  Allergen Reaction Noted  . Bee venom Anaphylaxis 05/10/2015  . Contrast media [iodinated diagnostic agents] Nausea And Vomiting 12/30/2013  . Wasp venom Anaphylaxis 05/10/2015  . Cherry Other (See Comments) 05/13/2015  . Penicillins Other (See Comments) 12/30/2013  . Vicodin [hydrocodone-acetaminophen] Diarrhea 01/25/2015   Past Medical History  Diagnosis Date  . Hypertension   . Sleep apnea   . Sleep apnea   . Narcolepsy   . PTSD (post-traumatic stress disorder)   . Bipolar disorder (HCC)   . Sleep apnea   . Diabetes mellitus without complication (HCC)   . Asthma    Past Surgical History  Procedure Laterality Date  . Tonsillectomy     Family History  Problem Relation Age of Onset  . Adopted: Yes  . Breast cancer Mother   . Bone cancer Father    Social History   Social History  . Marital Status: Single    Spouse Name: N/A  . Number of Children: N/A  . Years of Education: N/A   Occupational History  . Not on file.   Social History Main Topics  . Smoking status: Current Every Day Smoker -- 0.75 packs/day for 5 years    Types: Cigarettes  . Smokeless tobacco: Not on file  . Alcohol Use: 0.0 oz/week    0 Standard drinks or equivalent  per week     Comment: socially  . Drug Use: 5.00 per week    Special: Marijuana  . Sexual Activity: Not on file   Other Topics Concern  . Not on file   Social History Narrative    Review of Systems: CONSTITUTIONAL- No weightloss SKIN- No Rash, colour changes or itching. HEAD- Has recent acute Headache due to eye pain, no dizziness. Mouth/throat- No Sorethroat,  or bleeding gums. RESPIRATORY- No Cough or SOB. CARDIAC- No Palpitations, or chest pain. GI- No   Diarrhoea,  abd pain. URINARY- No Frequency, or dysuria. NEUROLOGIC- No  syncope, seizure.  Physical Exam: Blood pressure 145/78, pulse 102, temperature 98.8 F (37.1 C), temperature source Oral, resp. rate 24, SpO2 74 %. GENERAL- alert, intermittently sleeps off, while talking, morbidly obese, co-operative, appears as stated age, not in any distress. HEENT- Atraumatic, normocephalic, pupils equal round, minimal response to light, restricted movement of left eye due to swelling, marked swelling of inferior aspect of conjunctiva on left, erythematous conjunctiva, with greenish drainage on skin around eye, tenderness peri-orbital area, , oral mucosa appears mildly dry,  no cervical LN enlargement, no periauricular lymph nodes appreciated, bilat proptosis, worse on left, neck supple. CARDIAC- RRR, no murmurs, rubs or gallops. RESP- Moving equal volumes of air, diffuse mild expiratory and inspiratory wheezes , no crackles. ABDOMEN- Soft, nontender, obese, no palpable masses or organomegaly, bowel sounds present. BACK- Normal curvature of the spine NEURO- alert and oriented, moving all extremities, equal strenght . EXTREMITIES- warm and well perfused, puffy feet with out pitting. SKIN- Warm, dry, No rash or lesion. PSYCH- Normal mood and affect, appropriate thought content and speech.  Lab results: Basic Metabolic Panel:  Recent Labs  16/10/96 2006 05/10/15 2015 05/13/15 0920  NA 141 143 140  K 4.4 4.3 4.7  CL 102 98* 100*   CO2 31  --  32  GLUCOSE 105* 100* 191*  BUN CREATININE 1.15 1.20 1.16  CALCIUM 9.0  --  9.0   Liver Function Tests:  Recent Labs  05/10/15 2006  AST 24  ALT 22  ALKPHOS 81  BILITOT 0.2*  PROT 7.1  ALBUMIN 3.3*   CBC:  Recent Labs  05/10/15 2006 05/10/15 2015 05/13/15 0920  WBC 6.6  --  5.7  NEUTROABS 3.4  --  3.2  HGB 16.5 19.4* 16.0  HCT 53.3* 57.0* 51.8  MCV 89.3  --  88.9  PLT 195  --  179   Coagulation:  Recent Labs  05/10/15 2006  LABPROT 13.8  INR 1.04   Urinalysis:  Recent Labs  05/10/15 2305  COLORURINE YELLOW  LABSPEC 1.025  PHURINE 6.0  GLUCOSEU NEGATIVE  HGBUR NEGATIVE  BILIRUBINUR NEGATIVE  KETONESUR NEGATIVE  PROTEINUR NEGATIVE  NITRITE NEGATIVE  LEUKOCYTESUR NEGATIVE   Imaging results:  Ct head Wo Contrast - CT HEAD FINDINGS- 05/09/2014  The ventricles and the sulci are appropriate in size for the patient's age. There is no intracranial hemorrhage. No midline shift or mass effect identified. The gray-white matter differentiation is preserved.  The visualized paranasal sinuses and mastoid air cells are well aerated. The calvarium is intact.  CT ORBITS FINDINGS  The globes, retro-orbital fat, and orbital wall are preserved. The ocular musculature appear intact. There is dysconjugate gaze which may be related to strabismus. Clinical correlation is recommended the There is no fluid collection. The visualized osseous structures appear unremarkable. The paranasal sinuses are well aerated.  IMPRESSION: No acute intracranial pathology.  No acute of male orbital pathology.  MRI- 05/12/2014- FINDINGS: There is left lid edema and swelling with asymmetric enhancement of the left conjunctival sac consistent with preseptal cellulitis. No postseptal inflammation or edema is contiguous. No inciting sinusitis. There may have been a lid appendage infection on the nasal side on prior CT.  Mounding of the optic discs  with prominent optic nerve sheath fluid and mild flattening of the posterior sclera.  These findings suggest elevated pressures and papilledema. Partial intracranial imaging shows no hydrocephalus or mass effect. Pseudotumor considered given patient size.  Partially visualized bilateral mastoid effusion or mucosal thickening.  The extraocular muscles are diffusely thickened, especially compared to 2009. These also have a wispy T2 hyperintense appearance. Atypical pattern thyroid orbitopathy or sarcoidosis are the top considerations. Patient has proptosis on a chronic basis based on previous CT.  IMPRESSION: 1. Left preseptal cellulitis. 2. Imaging findings suggestive of papilledema; correlate with pressures and funduscopic exam. Partial intracranial imaging shows no hydrocephalus or mass effect. 3. Mild diffuse thickening of the extraocular muscles compared to 2009 head CT. Thyroid orbitopathy or sarcoidosis are top considerations. Proptosis is stable from 2009. 4. Bilateral mastoid effusion.   Other results: EKG: None.  Assessment & Plan by Problem: Principal Problem:   Orbital cellulitis on left Active Problems:   Major depressive disorder, recurrent severe without psychotic features (HCC)   OSA (obstructive sleep apnea)   Narcolepsy   Essential hypertension   Morbid obesity (HCC)   Type 2 diabetes mellitus with diabetic polyneuropathy, without long-term current use of insulin (HCC)  Orbital cellulitis- presentation suggestive with ophthalmoplegia, pain with eye movements, marked conjunctiva swelling, blurry vision, subjective fever and chills. No fever or leukocytosis documented here. Common organisms- Staph and stre, uncommon- pseudomonas and anerobs. Differentials still include preseptal cellulitis inadequately treated with appropraite antibiotics, complications include abscess collection.  - Admit to med -surg - Cont antibiotics IV Vanc and ceftriazone,tp cover  Common organisms- Staph and strep. - Awaiting opthalmology eval, recs appreciated - MRI pending - Follow up blood cultures, drawn before antibiotics. - NPO for now, till MRI shows no abscess, and Opthal eavl.  Diabetes- Hgba1c- 7- 02/2015. Home meds- metformin 500 BID - SSI-s  HTN- Bp stable, home meds- on Lisinopril-  daily.  No longer on lasix  BID for leg swelling. - Cont lisinopril  daily  Chronic Pain- on pain meds- oxycodone  PRN daily. Pain- chronic knee pain. - reduce dose to  q6H prn, as pt kept falling asleep while taking the history.  ? Reactive airway disease- pt says he gets SOB and starts wheezing with exercise, has not been diagnsoed with any form of asthma, but inhalers he uses help. - Albuterol inh Q6H PRN   OSA- Diagnosed with OSA. But pt has not been able to afford CPAP. - will ike to try CPAP inpt, ordered.  Tobacco Abuse- smokes 2-3 cigs per day. - Nicotine patch.  Proptosis - Bilat, patient says his eyes have always protuded. TSH- 02/2015- normal 0.717.   Dispo: Disposition is deferred at this time, awaiting improvement of current medical problems.   Signed: Onnie Boer, MD 05/13/2015, 3:31 PM

## 2015-05-13 NOTE — ED Notes (Signed)
Patient states L eye cellulitis and states he was supposed to follow up with eye doctor, but has not.  Patient states he followed up with "Nurse Mary at [facility]", but she said "just keep taking your medicine".   Patient eye appears to be protruding, redness and swelling.

## 2015-05-13 NOTE — Progress Notes (Signed)
Auto CPAP set up for pt with max pressure of 20cmH20 and min pressure of 5cmH20. Pt will try nasal mask due to very swollen eye. Pt shown how to turn on machine and how to adjust mask, but advised if he needed any assistance, to please notify RN and I would come up to help. RT will continue to monitor.

## 2015-05-13 NOTE — Consult Note (Signed)
CC:  Chief Complaint  Patient presents with  . Eye Problem    HPI: Kenneth Mcfarland is a 35 y.o. male w/ POH of old preseptal or orbital cellulitis OS and PMH notable for DM who presents for evaluation of left eye swelling. Symptoms started 1 week ago with lid swelling. Worsened thus went to ED/urgent care and was given PO antibiotics that didn't help over the last 3 days. CT was negative for orbital. Still w/ blurry vision and swelling OS.   ROS: + fevers and cough, + left eye pain and swelling Denies unintentional weight loss, chest pain, irregular heart rhythm, SOB,abdominal pain, melena, hematochezia, weakness,  depressed mood  PMH: Past Medical History  Diagnosis Date  . Hypertension   . Sleep apnea   . Sleep apnea   . Narcolepsy   . PTSD (post-traumatic stress disorder)   . Bipolar disorder (HCC)   . Sleep apnea   . Diabetes mellitus without complication (HCC)   . Asthma     PSH: Past Surgical History  Procedure Laterality Date  . Tonsillectomy      Meds: No current facility-administered medications on file prior to encounter.   Current Outpatient Prescriptions on File Prior to Encounter  Medication Sig Dispense Refill  . ALPRAZolam (XANAX) 1 MG tablet Take 1 mg by mouth 3 (three) times daily as needed for anxiety.   2  . furosemide (LASIX) 20 MG tablet Take 1 tablet (20 mg total) by mouth 2 (two) times daily. 180 tablet 1  . gabapentin (NEURONTIN) 300 MG capsule Take 1 capsule (300 mg total) by mouth 2 (two) times daily. Take 1 at bedtime for 1 week, then increase to twice a day. (Patient taking differently: Take 300 mg by mouth 2 (two) times daily. ) 90 capsule 3  . lisinopril (PRINIVIL,ZESTRIL) 5 MG tablet Take 1 tablet (5 mg total) by mouth daily. 90 tablet 1  . metFORMIN (GLUCOPHAGE) 500 MG tablet TAKE 1 TABLET BY MOUTH TWICE A DAY WITH MEALS (Patient taking differently: Take 500 mg by mouth 2 (two) times daily with a meal. ) 180 tablet 0  . Oxycodone HCl 10 MG  TABS Take 1 tablet (10 mg total) by mouth every 6 (six) hours as needed (pain). 10 tablet 0  . Pirbuterol Acetate (MAXAIR AUTOHALER IN) Inhale 2 puffs into the lungs every 6 (six) hours as needed (shortness of breath/wheezing).      SH: Social History   Social History  . Marital Status: Single    Spouse Name: N/A  . Number of Children: N/A  . Years of Education: N/A   Social History Main Topics  . Smoking status: Current Every Day Smoker -- 0.75 packs/day for 5 years    Types: Cigarettes  . Smokeless tobacco: None  . Alcohol Use: 0.0 oz/week    0 Standard drinks or equivalent per week     Comment: socially  . Drug Use: 5.00 per week    Special: Marijuana  . Sexual Activity: Not Asked   Other Topics Concern  . None   Social History Narrative    FH: Family History  Problem Relation Age of Onset  . Adopted: Yes  . Breast cancer Mother   . Bone cancer Father     Exam:  Zenaida Niece: OD: 3pt [place] OS: 20/400 eq [place]  CVF: OD: full OS: decreased - grossly HM  EOM: OD: full  OS: grossly appears limitation of abduction/depression/adduction - ? Related to mechanical from chemosis  Pupils: OD: 3->2  mm, no APD OS: 3->2 mm, no APD  External: OD: no periorbital edema, no proptosis,  good orbicularis strength OS: mild periorbital edema but marked 3+ inf chemosis, no proptosis - appears mostly chemosis, good orbicularis strength  Hertel: no significant asymmetry on Hertel but very limited ability to measure due to pain and chemosis OS  PF: 9/5  Pen Light Exam: L/L: OD: WNL OS: mild lid edema, mucous stranding  C/S: OD: white and quiet OS: 3+ chemosis inf w/ prolapse  K: OD: clear, no abnormal staining OS: clear, no abnormal staining  A/C: OD: grossly deep and quiet appearing by pen light OS: grossly deep and quiet appearing by pen light  L: OD: NSC OS: NSC  DFE:  V: OD: clear OS: clear  N: OD: C/D 0.3, no disc edema OS: C/D 0.3, no disc edema  M: OD:  flat, no obvious macular pathology OS: flat, no obvious macular pathology  V: OD: normal appearing vessels OS: normal appearing vessels  P: OD: retina flat 360, no obvious mass/RT/RD, no diabetic retinopathy OS: retina flat 360, no obvious mass/RT/RD, no diabetic retinopathy  A/P:  1. Orbital Cellulitis OS: - Exam w/ orbital signs: chemosis, limitations of motility, ? Proptosis but no APD and repeat imaging w/o subperiosteal abscess - Recommend admission for IV Abx - recommend at least IV Vanc given failed PO Clinda - Recommend IV Solumedrol 1g starting tomorrow AM given marked chemosis and will have had ~24 hours since first dose of IV Vanc - Recommend ophthalmic ointment (Lubrifresh or white petroleum) QID in the left eye  - Will continue to follow daily  2. Proptosis: - Would recommend repeat TSH w/ reflex T4 and Thyrotropin Receptor Ab testing to work-up possible underlying thyroid eye disease  3. DM: - No evidence of diabetic retinopathy or diabetic macular edema of both eyes  Christopher T. Sherryll Burger, MD [facility] [phone]

## 2015-05-14 ENCOUNTER — Encounter (HOSPITAL_COMMUNITY): Payer: Self-pay | Admitting: General Practice

## 2015-05-14 LAB — GLUCOSE, CAPILLARY
GLUCOSE-CAPILLARY: 123 mg/dL — AB (ref 65–99)
GLUCOSE-CAPILLARY: 151 mg/dL — AB (ref 65–99)
GLUCOSE-CAPILLARY: 334 mg/dL — AB (ref 65–99)
GLUCOSE-CAPILLARY: 380 mg/dL — AB (ref 65–99)

## 2015-05-14 MED ORDER — METHYLPREDNISOLONE SODIUM SUCC 1000 MG IJ SOLR
1000.0000 mg | Freq: Every day | INTRAMUSCULAR | Status: AC
  Administered 2015-05-15 – 2015-05-16 (×2): 1000 mg via INTRAVENOUS
  Filled 2015-05-14 (×3): qty 8

## 2015-05-14 MED ORDER — NICOTINE 7 MG/24HR TD PT24
7.0000 mg | MEDICATED_PATCH | Freq: Every day | TRANSDERMAL | Status: DC
  Administered 2015-05-14 – 2015-05-16 (×3): 7 mg via TRANSDERMAL
  Filled 2015-05-14 (×3): qty 1

## 2015-05-14 MED ORDER — TOBRAMYCIN-DEXAMETHASONE 0.3-0.1 % OP OINT
TOPICAL_OINTMENT | OPHTHALMIC | Status: DC
  Administered 2015-05-14 – 2015-05-15 (×7): via OPHTHALMIC
  Administered 2015-05-16 (×3): 1 via OPHTHALMIC
  Administered 2015-05-16: 07:00:00 via OPHTHALMIC
  Administered 2015-05-16 – 2015-05-17 (×3): 1 via OPHTHALMIC
  Filled 2015-05-14 (×2): qty 3.5

## 2015-05-14 MED ORDER — ENOXAPARIN SODIUM 80 MG/0.8ML ~~LOC~~ SOLN
80.0000 mg | SUBCUTANEOUS | Status: DC
  Filled 2015-05-14 (×3): qty 0.8

## 2015-05-14 MED ORDER — DEXTROSE 5 % IV SOLN
2.0000 g | INTRAVENOUS | Status: DC
  Filled 2015-05-14: qty 2

## 2015-05-14 MED ORDER — DEXTROSE 5 % IV SOLN
2.0000 g | Freq: Two times a day (BID) | INTRAVENOUS | Status: DC
  Administered 2015-05-14 – 2015-05-15 (×4): 2 g via INTRAVENOUS
  Filled 2015-05-14 (×6): qty 2

## 2015-05-14 NOTE — Progress Notes (Signed)
S: symptoms and swelling unchanged. Hot/cold over night.   O: VAN:  OD: 3 pt OS: 20/400 eq  Pupils: OD: 3->2 no APD OS: 3->2 no APD  EOM: OD: full OS: mild limitation likely from chemosis  CVF: OD: full OS: constricted  Pen Light: L/L: OD: WNL OS: no significant lid edema  C/S: OD: W/Q OS: marked 3+ chemosis inferiorly, unchanged - ? Developing keratinization  K: OD: clear OS: clear  A/C: OD: grossly deep OS: grossly deep  L: OD: NSC OS: NSC  A/P: 1. Orbital Cellulitis OS: - Recommend continue IV ABx until more clinically improvement - Recommend daily 1g IV Solumedrol for total of 3 days and then short PO steroid taper over 1 week - Will continue to follow daily  2. Chemosis OS: - Recommend switching to maxitrol or tobradex ointment QID OS to keep chemosis/conjunctival tissue lubricated  Christopher T. Sherryll Burger, MD [facility]

## 2015-05-14 NOTE — Progress Notes (Signed)
Subjective: Patient seen and examined this morning on rounds.  No acute events overnight.  States he is still having pain around his eye, blurred vision, and drainage.    Objective: Vital signs in last 24 hours: Filed Vitals:   05/13/15 2019 05/13/15 2040 05/14/15 0519 05/14/15 0704  BP: 139/80 143/80 181/94 127/80  Pulse:  87 79   Temp:  98.3 F (36.8 C) 98.2 F (36.8 C)   TempSrc:  Oral    Resp:  22 21   Height:   (1.753 m)    Weight:  366 lb 2.9 oz (166.1 kg)    SpO2:  97% 94%    Weight change:  No intake or output data in the 24 hours ending 05/14/15 1053  General: obese man, sitting up in bed HEENT: miosis of left eye with minimal response to light, swelling of inferior conjunctiva with erythema, possible granulomatous appearance to conjunctiva Cardiac: RRR, no rubs, murmurs or gallops Pulm: diffuse wheezes with inspiration and expiration, moving normal volumes of air Abd: obese, soft, nontender, nondistended, BS present Ext: warm and well perfused, no pitting edema Neuro: alert and oriented X3, no focal deficits  Lab Results: Basic Metabolic Panel:  Recent Labs Lab 05/10/15 2006 05/10/15 2015 05/13/15 0920  NA 141 143 140  K 4.4 4.3 4.7  CL 102 98* 100*  CO2 31  --  32  GLUCOSE 105* 100* 191*  BUN CREATININE 1.15 1.20 1.16  CALCIUM 9.0  --  9.0   Liver Function Tests:  Recent Labs Lab 05/10/15 2006  AST 24  ALT 22  ALKPHOS 81  BILITOT 0.2*  PROT 7.1  ALBUMIN 3.3*   No results for input(s): LIPASE, AMYLASE in the last 168 hours. No results for input(s): AMMONIA in the last 168 hours. CBC:  Recent Labs Lab 05/10/15 2006 05/10/15 2015 05/13/15 0920  WBC 6.6  --  5.7  NEUTROABS 3.4  --  3.2  HGB 16.5 19.4* 16.0  HCT 53.3* 57.0* 51.8  MCV 89.3  --  88.9  PLT 195  --  179   Cardiac Enzymes: No results for input(s): CKTOTAL, CKMB, CKMBINDEX, TROPONINI in the last 168 hours. BNP: No results for input(s): PROBNP in the last  168 hours. D-Dimer: No results for input(s): DDIMER in the last 168 hours. CBG:  Recent Labs Lab 05/13/15 1727 05/13/15 2149 05/14/15 0754  GLUCAP 149* 181* 123*   Hemoglobin A1C: No results for input(s): HGBA1C in the last 168 hours. Fasting Lipid Panel: No results for input(s): CHOL, HDL, LDLCALC, TRIG, CHOLHDL, LDLDIRECT in the last 168 hours. Thyroid Function Tests:  Recent Labs Lab 05/13/15 1804  TSH 2.125   Coagulation:  Recent Labs Lab 05/10/15 2006  LABPROT 13.8  INR 1.04   Anemia Panel: No results for input(s): VITAMINB12, FOLATE, FERRITIN, TIBC, IRON, RETICCTPCT in the last 168 hours. Urine Drug Screen: Drugs of Abuse     Component Value Date/Time   LABOPIA NONE DETECTED 08/11/2014 1812   COCAINSCRNUR NONE DETECTED 08/11/2014 1812   LABBENZ NONE DETECTED 08/11/2014 1812   AMPHETMU NONE DETECTED 08/11/2014 1812   THCU POSITIVE* 08/11/2014 1812   LABBARB NONE DETECTED 08/11/2014 1812    Alcohol Level: No results for input(s): ETH in the last 168 hours. Urinalysis:  Recent Labs Lab 05/10/15 2305  COLORURINE YELLOW  LABSPEC 1.025  PHURINE 6.0  GLUCOSEU NEGATIVE  HGBUR NEGATIVE  BILIRUBINUR NEGATIVE  KETONESUR NEGATIVE  PROTEINUR NEGATIVE  NITRITE NEGATIVE  LEUKOCYTESUR NEGATIVE     Micro Results: No results found for this or any previous visit (from the past 240 hour(s)). Studies/Results: Mr Birdie Hopes Wo/w Cm  05/13/2015  CLINICAL DATA:  Redness and swelling of the left eye beginning 1 week ago. Severe pain not responding to antibiotics. EXAM: MRI OF THE ORBITS WITHOUT AND WITH CONTRAST TECHNIQUE: Multiplanar, multisequence MR imaging of the orbits was performed both before and after the administration of intravenous contrast. CONTRAST:  20mL MULTIHANCE GADOBENATE DIMEGLUMINE 529 MG/ML IV SOLN COMPARISON:  Orbit CT from 3 days ago FINDINGS: There is left lid edema and swelling with asymmetric enhancement of the left conjunctival sac consistent  with preseptal cellulitis. No postseptal inflammation or edema is contiguous. No inciting sinusitis. There may have been a lid appendage infection on the nasal side on prior CT. Mounding of the optic discs with prominent optic nerve sheath fluid and mild flattening of the posterior sclera. These findings suggest elevated pressures and papilledema. Partial intracranial imaging shows no hydrocephalus or mass effect. Pseudotumor considered given patient size. Partially visualized bilateral mastoid effusion or mucosal thickening. The extraocular muscles are diffusely thickened, especially compared to 2009. These also have a wispy T2 hyperintense appearance. Atypical pattern thyroid orbitopathy or sarcoidosis are the top considerations. Patient has proptosis on a chronic basis based on previous CT. IMPRESSION: 1. Left preseptal cellulitis. 2. Imaging findings suggestive of papilledema; correlate with pressures and funduscopic exam. Partial intracranial imaging shows no hydrocephalus or mass effect. 3. Mild diffuse thickening of the extraocular muscles compared to 2009 head CT. Thyroid orbitopathy or sarcoidosis are top considerations. Proptosis is stable from 2009. 4. Bilateral mastoid effusion. Electronically Signed   By: Marnee Spring M.D.   On: 05/13/2015 15:02   Medications: Scheduled Meds: . ceFEPime (MAXIPIME) IV  2 g Intravenous Q12H  . enoxaparin (LOVENOX) injection  40 mg Subcutaneous Q24H  . gabapentin  300 mg Oral BID  . insulin aspart  0-9 Units Subcutaneous TID WC  . lisinopril  5 mg Oral Daily  . methylPREDNISolone (SOLU-MEDROL) injection  1,000 mg Intravenous Once  . nicotine  7 mg Transdermal Once  . vancomycin  1,250 mg Intravenous Q12H  . white petrolatum   Topical QID   Continuous Infusions:  PRN Meds:.albuterol, oxyCODONE Assessment/Plan: Principal Problem:   Orbital cellulitis on left Active Problems:   Major depressive disorder, recurrent severe without psychotic features  (HCC)   OSA (obstructive sleep apnea)   Narcolepsy   Essential hypertension   Morbid obesity (HCC)   Type 2 diabetes mellitus with diabetic polyneuropathy, without long-term current use of insulin (HCC)  Orbital Cellulitis: presentation suggesting orbital cellulitis with orbital signs: chemosis, limitations of eye movement, conjunctiva swelling, blurry vision.  MRI findings as above.  Possible granulomatous conjunctivitis - Ophthalmology following, appreciate their consultation - Continue IV antibiotics: will continue with Vancomycin but discontinue Ceftriaxone and add Cefepime for broader GNR/Pseudomonal coverage - Solu-Medrol 1gm IV today, await further recs from Ophthalmology - Eye ointment QID - Blood cultures pending - check immunoglobulins Ig G, A, M, E - CBC w/ diff   Diabetes Mellitus: Hgb A1C 7 in 02/2015.  On Metformin at home.  No evidence of DM retinopathy - SSI - sensitive - continue Gabapentin  HTN: Stable, home meds are Lisinopril  daily. No longer on Lasix  BID for leg swelling. - Cont lisinopril  daily  Chronic Pain: on pain meds oxycodone  PRN daily for chronic knee pain. - reduce dose to  q6H prn and  monitor  ? Reactive [place] Disease: patient says he gets SOB and starts wheezing with exercise, has not been diagnsoed with any form of asthma, but inhalers he uses help. - Albuterol inh Q6H PRN  OSA: Diagnosed with OSA, but has not been able to afford CPAP. - continue CPAP inpatient  Tobacco Abuse: smokes 2-3 cigs per day. - Nicotine patch.  Proptosis: Bilateral, patient says his eyes have always protuded. TSH- 02/2015- normal 0.717 - repeat TSH at admission normal - Thyrotropin receptor antibodies pending  FEN Fluids: none Electrolytes: replace as needed  Nutrition: heart healthy  DVT PPx: Lovenox  Code: FULL  Dispo: Disposition is deferred at this time, awaiting improvement of current medical problems.    The patient does have a  current PCP Lavinia Sharps, NP) and does not need an [facility] hospital follow-up appointment after discharge.  The patient does not have transportation limitations that hinder transportation to clinic appointments.  .Services Needed at time of discharge: Y = Yes, Blank = No PT:   OT:   RN:   Equipment:   Other:     LOS: 1 day   Gwynn Burly, DO 05/14/2015, 10:53 AM

## 2015-05-14 NOTE — Progress Notes (Signed)
Patient said that he was able to place self on and off CPAP tonight. RT made patient aware that if he needed any help to call.

## 2015-05-14 NOTE — Progress Notes (Signed)
ANTIBIOTIC CONSULT NOTE - INITIAL  Pharmacy Consult for cefepime Indication: orbital cellulitis  Allergies  Allergen Reactions  . Bee Venom Anaphylaxis  . Contrast Media [Iodinated Diagnostic Agents] Nausea And Vomiting       . Wasp Venom Anaphylaxis  . Cherry Other (See Comments)    unknown  . Penicillins Other (See Comments)    Childhood allergic reaction - no other information available  . Vicodin [Hydrocodone-Acetaminophen] Diarrhea    Labs:  Recent Labs  05/13/15 0920  WBC 5.7  HGB 16.0  PLT 179  CREATININE 1.16    Assessment/Plan:  34yo male started on vanc for orbital cellulitis, now to broaden coverage.  Will start cefepime 2g IV Q12H and monitor CBC and Cx.  Vernard Gambles, PharmD, BCPS  05/14/2015,7:46 AM

## 2015-05-14 NOTE — Care Management (Signed)
On day of discharge please call case manager for [facility] letter ( medication assistance) . Ronny Flurry RN BSN [phone]

## 2015-05-15 ENCOUNTER — Inpatient Hospital Stay (HOSPITAL_COMMUNITY): Payer: Self-pay

## 2015-05-15 DIAGNOSIS — E1165 Type 2 diabetes mellitus with hyperglycemia: Secondary | ICD-10-CM

## 2015-05-15 DIAGNOSIS — Z794 Long term (current) use of insulin: Secondary | ICD-10-CM

## 2015-05-15 DIAGNOSIS — H5789 Other specified disorders of eye and adnexa: Secondary | ICD-10-CM | POA: Insufficient documentation

## 2015-05-15 DIAGNOSIS — H547 Unspecified visual loss: Secondary | ICD-10-CM | POA: Insufficient documentation

## 2015-05-15 DIAGNOSIS — J984 Other disorders of lung: Secondary | ICD-10-CM

## 2015-05-15 LAB — CBC WITH DIFFERENTIAL/PLATELET
Basophils Absolute: 0 10*3/uL (ref 0.0–0.1)
Basophils Relative: 0 %
EOS PCT: 0 %
Eosinophils Absolute: 0 10*3/uL (ref 0.0–0.7)
HCT: 52.6 % — ABNORMAL HIGH (ref 39.0–52.0)
HEMOGLOBIN: 16.1 g/dL (ref 13.0–17.0)
LYMPHS ABS: 0.7 10*3/uL (ref 0.7–4.0)
LYMPHS PCT: 6 %
MCH: 27.3 pg (ref 26.0–34.0)
MCHC: 30.6 g/dL (ref 30.0–36.0)
MCV: 89.2 fL (ref 78.0–100.0)
Monocytes Absolute: 0.3 10*3/uL (ref 0.1–1.0)
Monocytes Relative: 3 %
NEUTROS PCT: 91 %
Neutro Abs: 10.8 10*3/uL — ABNORMAL HIGH (ref 1.7–7.7)
Platelets: 184 10*3/uL (ref 150–400)
RBC: 5.9 MIL/uL — AB (ref 4.22–5.81)
RDW: 14 % (ref 11.5–15.5)
WBC: 11.9 10*3/uL — AB (ref 4.0–10.5)

## 2015-05-15 LAB — BASIC METABOLIC PANEL
Anion gap: 6 (ref 5–15)
BUN: 11 mg/dL (ref 6–20)
CHLORIDE: 100 mmol/L — AB (ref 101–111)
CO2: 29 mmol/L (ref 22–32)
Calcium: 8.7 mg/dL — ABNORMAL LOW (ref 8.9–10.3)
Creatinine, Ser: 1.03 mg/dL (ref 0.61–1.24)
GFR calc Af Amer: >60 mL/min (ref >60)
GFR calc non Af Amer: >60 mL/min (ref >60)
GLUCOSE: 337 mg/dL — AB (ref 65–99)
POTASSIUM: 4.8 mmol/L (ref 3.5–5.1)
Sodium: 135 mmol/L (ref 135–145)

## 2015-05-15 LAB — GLUCOSE, CAPILLARY
GLUCOSE-CAPILLARY: 305 mg/dL — AB (ref 65–99)
GLUCOSE-CAPILLARY: 393 mg/dL — AB (ref 65–99)
GLUCOSE-CAPILLARY: 387 mg/dL — AB (ref 65–99)
Glucose-Capillary: 294 mg/dL — ABNORMAL HIGH (ref 65–99)

## 2015-05-15 LAB — THYROTROPIN RECEPTOR AUTOABS: Thyrotropin Receptor Ab: 1.32 IU/L (ref 0.00–1.75)

## 2015-05-15 MED ORDER — INSULIN ASPART 100 UNIT/ML ~~LOC~~ SOLN
0.0000 [IU] | Freq: Three times a day (TID) | SUBCUTANEOUS | Status: DC
  Administered 2015-05-15: 20 [IU] via SUBCUTANEOUS
  Administered 2015-05-15: 15 [IU] via SUBCUTANEOUS
  Administered 2015-05-16 (×2): 20 [IU] via SUBCUTANEOUS
  Administered 2015-05-16: 15 [IU] via SUBCUTANEOUS
  Administered 2015-05-17: 11 [IU] via SUBCUTANEOUS
  Administered 2015-05-17: 15 [IU] via SUBCUTANEOUS

## 2015-05-15 MED ORDER — PANTOPRAZOLE SODIUM 40 MG PO TBEC
40.0000 mg | DELAYED_RELEASE_TABLET | Freq: Every day | ORAL | Status: DC
  Administered 2015-05-15 – 2015-05-17 (×3): 40 mg via ORAL
  Filled 2015-05-15 (×3): qty 1

## 2015-05-15 MED ORDER — INSULIN ASPART 100 UNIT/ML ~~LOC~~ SOLN: 0.0000 [IU] | Freq: Every day | SUBCUTANEOUS | Status: DC

## 2015-05-15 MED ORDER — INSULIN ASPART 100 UNIT/ML ~~LOC~~ SOLN
0.0000 [IU] | Freq: Three times a day (TID) | SUBCUTANEOUS | Status: DC
  Administered 2015-05-15: 15 [IU] via SUBCUTANEOUS

## 2015-05-15 MED ORDER — INSULIN ASPART 100 UNIT/ML ~~LOC~~ SOLN
0.0000 [IU] | Freq: Every day | SUBCUTANEOUS | Status: DC
  Administered 2015-05-15: 5 [IU] via SUBCUTANEOUS
  Administered 2015-05-16: 3 [IU] via SUBCUTANEOUS

## 2015-05-15 MED ORDER — INSULIN GLARGINE 100 UNIT/ML ~~LOC~~ SOLN
10.0000 [IU] | Freq: Every day | SUBCUTANEOUS | Status: DC
  Administered 2015-05-15: 10 [IU] via SUBCUTANEOUS
  Filled 2015-05-15 (×2): qty 0.1

## 2015-05-15 NOTE — Progress Notes (Signed)
Subjective: Patient seen and examined this morning on rounds.  No acute events overnight.  Some improvement noted but he is still with blurry vision, pain with ocular movements.  Steroids are making him jittery and difficult to sleep.  No other complaints.  Objective: Vital signs in last 24 hours: Filed Vitals:   05/14/15 0704 05/14/15 1510 05/14/15 2046 05/15/15 0552  BP: 127/80 154/88 154/90 149/105  Pulse:  81 85 87  Temp:  98 F (36.7 C) 98.5 F (36.9 C) 98.3 F (36.8 C)  TempSrc:  Oral Oral Oral  Resp:  Height:      Weight:      SpO2:  96% 96% 97%   Weight change:  No intake or output data in the 24 hours ending 05/15/15 1049  General: obese man, sitting up in chair, no distress HEENT: miosis of left eye with response to light, swelling of inferior conjunctiva with erythema looks better today, ? possible granulomatous appearance to conjunctiva Cardiac: RRR, no rubs, murmurs or gallops Pulm: diffuse wheezes with inspiration and expiration, moving normal volumes of air Abd: obese Ext: warm and well perfused, swollen legs, but no pitting edema Neuro: alert and oriented X3, no focal deficits  Lab Results: Basic Metabolic Panel:  Recent Labs Lab 05/13/15 0920 05/15/15 0518  NA 140 135  K 4.7 4.8  CL 100* 100*  CO2 32 29  GLUCOSE 191* 337*  BUN 11 11  CREATININE 1.16 1.03  CALCIUM 9.0 8.7*   Liver Function Tests:  Recent Labs Lab 05/10/15 2006  AST 24  ALT 22  ALKPHOS 81  BILITOT 0.2*  PROT 7.1  ALBUMIN 3.3*   No results for input(s): LIPASE, AMYLASE in the last 168 hours. No results for input(s): AMMONIA in the last 168 hours. CBC:  Recent Labs Lab 05/13/15 0920 05/15/15 0518  WBC 5.7 11.9*  NEUTROABS 3.2 10.8*  HGB 16.0 16.1  HCT 51.8 52.6*  MCV 88.9 89.2  PLT 179 184   Cardiac Enzymes: No results for input(s): CKTOTAL, CKMB, CKMBINDEX, TROPONINI in the last 168 hours. BNP: No results for input(s): PROBNP in the last 168  hours. D-Dimer: No results for input(s): DDIMER in the last 168 hours. CBG:  Recent Labs Lab 05/13/15 2149 05/14/15 0754 05/14/15 1227 05/14/15 1657 05/14/15 2141 05/15/15 0811  GLUCAP 181* 123* 151* 334* 380* 294*   Hemoglobin A1C: No results for input(s): HGBA1C in the last 168 hours. Fasting Lipid Panel: No results for input(s): CHOL, HDL, LDLCALC, TRIG, CHOLHDL, LDLDIRECT in the last 168 hours. Thyroid Function Tests:  Recent Labs Lab 05/13/15 1804  TSH 2.125   Coagulation:  Recent Labs Lab 05/10/15 2006  LABPROT 13.8  INR 1.04   Anemia Panel: No results for input(s): VITAMINB12, FOLATE, FERRITIN, TIBC, IRON, RETICCTPCT in the last 168 hours. Urine Drug Screen: Drugs of Abuse     Component Value Date/Time   LABOPIA NONE DETECTED 08/11/2014 1812   COCAINSCRNUR NONE DETECTED 08/11/2014 1812   LABBENZ NONE DETECTED 08/11/2014 1812   AMPHETMU NONE DETECTED 08/11/2014 1812   THCU POSITIVE* 08/11/2014 1812   LABBARB NONE DETECTED 08/11/2014 1812    Alcohol Level: No results for input(s): ETH in the last 168 hours. Urinalysis:  Recent Labs Lab 05/10/15 2305  COLORURINE YELLOW  LABSPEC 1.025  PHURINE 6.0  GLUCOSEU NEGATIVE  HGBUR NEGATIVE  BILIRUBINUR NEGATIVE  KETONESUR NEGATIVE  PROTEINUR NEGATIVE  NITRITE NEGATIVE  LEUKOCYTESUR NEGATIVE     Micro Results: Recent Results (  from the past 240 hour(s))  Culture, blood (routine x 2)     Status: None (Preliminary result)   Collection Time: 05/13/15 11:19 AM  Result Value Ref Range Status   Specimen Description BLOOD BLOOD RIGHT FOREARM  Final   Special Requests BOTTLES DRAWN AEROBIC AND ANAEROBIC 5CC  Final   Culture NO GROWTH 1 DAY  Final   Report Status PENDING  Incomplete  Culture, blood (routine x 2)     Status: None (Preliminary result)   Collection Time: 05/13/15 11:24 AM  Result Value Ref Range Status   Specimen Description BLOOD RIGHT ANTECUBITAL  Final   Special Requests BOTTLES DRAWN  AEROBIC AND ANAEROBIC 10CC  Final   Culture NO GROWTH 1 DAY  Final   Report Status PENDING  Incomplete   Studies/Results: Mr Birdie Hopes Wo/w Cm  05/13/2015  CLINICAL DATA:  Redness and swelling of the left eye beginning 1 week ago. Severe pain not responding to antibiotics. EXAM: MRI OF THE ORBITS WITHOUT AND WITH CONTRAST TECHNIQUE: Multiplanar, multisequence MR imaging of the orbits was performed both before and after the administration of intravenous contrast. CONTRAST:  20mL MULTIHANCE GADOBENATE DIMEGLUMINE 529 MG/ML IV SOLN COMPARISON:  Orbit CT from 3 days ago FINDINGS: There is left lid edema and swelling with asymmetric enhancement of the left conjunctival sac consistent with preseptal cellulitis. No postseptal inflammation or edema is contiguous. No inciting sinusitis. There may have been a lid appendage infection on the nasal side on prior CT. Mounding of the optic discs with prominent optic nerve sheath fluid and mild flattening of the posterior sclera. These findings suggest elevated pressures and papilledema. Partial intracranial imaging shows no hydrocephalus or mass effect. Pseudotumor considered given patient size. Partially visualized bilateral mastoid effusion or mucosal thickening. The extraocular muscles are diffusely thickened, especially compared to 2009. These also have a wispy T2 hyperintense appearance. Atypical pattern thyroid orbitopathy or sarcoidosis are the top considerations. Patient has proptosis on a chronic basis based on previous CT. IMPRESSION: 1. Left preseptal cellulitis. 2. Imaging findings suggestive of papilledema; correlate with pressures and funduscopic exam. Partial intracranial imaging shows no hydrocephalus or mass effect. 3. Mild diffuse thickening of the extraocular muscles compared to 2009 head CT. Thyroid orbitopathy or sarcoidosis are top considerations. Proptosis is stable from 2009. 4. Bilateral mastoid effusion. Electronically Signed   By: Marnee Spring M.D.    On: 05/13/2015 15:02   Medications: Scheduled Meds: . ceFEPime (MAXIPIME) IV  2 g Intravenous Q12H  . enoxaparin (LOVENOX) injection  80 mg Subcutaneous Q24H  . gabapentin  300 mg Oral BID  . insulin aspart  0-15 Units Subcutaneous TID WC  . insulin aspart  0-5 Units Subcutaneous QHS  . lisinopril  5 mg Oral Daily  . methylPREDNISolone (SOLU-MEDROL) injection  1,000 mg Intravenous Daily  . nicotine  7 mg Transdermal Daily  . pantoprazole  40 mg Oral Daily  . tobramycin-dexamethasone   Left Eye Q4H while awake  . vancomycin  1,250 mg Intravenous Q12H   Continuous Infusions:  PRN Meds:.albuterol, oxyCODONE Assessment/Plan: Principal Problem:   Orbital cellulitis on left Active Problems:   Major depressive disorder, recurrent severe without psychotic features (HCC)   OSA (obstructive sleep apnea)   Narcolepsy   Essential hypertension   Morbid obesity (HCC)   Type 2 diabetes mellitus with diabetic polyneuropathy, without long-term current use of insulin (HCC)   Decreased vision   Eye swelling  Orbital Cellulitis: presentation suggesting orbital cellulitis with orbital signs: chemosis, limitations of  eye movement, conjunctiva swelling, blurry vision.  MRI findings as above.  Possible granulomatous conjunctivitis vs sarcoid vs thyroid disease - Ophthalmology following, appreciate their consultation - Continue IV antibiotics: Vancomycin and Cefepime (day #2) - Solu-Medrol 1gm IV today and tomorrow, then taper over 1 week on PO steroids.  Add PPI with steroids - Tobradex eye ointment q4h - Blood cultures NG x 1 day - immunoglobulins Ig G, A, M, E pending - WBC up today probably from steroids - Thyroid studies normal so far to evaluate for thyroid disease - will get CXR, ACE level today to evaluate for possible sarcoid  Diabetes Mellitus: Hgb A1C 7 in 02/2015.  On Metformin at home.  No evidence of DM retinopathy - CBGs much more elevated today.  Will adjust sliding scale to  resistant and add Lantus 10units QHS - continue Gabapentin  HTN: Stable, home meds are Lisinopril  daily. No longer on Lasix  BID for leg swelling. - Cont lisinopril  daily  Chronic Pain: on pain meds oxycodone  PRN daily for chronic knee pain. - continue with  q6h prn and monitor  ? Reactive Dirway Disease: patient says he gets SOB and starts wheezing with exercise, has not been diagnsoed with any form of asthma, but inhalers he uses help. - Albuterol inh Q6H PRN  OSA: Diagnosed with OSA, but has not been able to afford CPAP. - continue CPAP inpatient  Tobacco Abuse: smokes 2-3 cigs per day. - Nicotine patch.  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 TSH- 02/2015- normal 0.717 - repeat TSH at admission normal - Thyrotropin receptor antibodies normal  FEN Fluids: none Electrolytes: replace as needed  Nutrition: heart healthy  DVT PPx: Lovenox  Code: FULL  Dispo: Disposition is deferred at this time, awaiting improvement of current medical problems.    The patient does have a current PCP Lavinia Sharps, NP) and does not need an [facility] hospital follow-up appointment after discharge.  The patient does not have transportation limitations that hinder transportation to clinic appointments.  .Services Needed at time of discharge: Y = Yes, Blank = No PT:   OT:   RN:   Equipment:   Other:     LOS: 2 days   Gwynn Burly, DO 05/15/2015, 10:49 AM

## 2015-05-15 NOTE — Progress Notes (Signed)
Inpatient Diabetes Program Recommendations  AACE/ADA: New Consensus Statement on Inpatient Glycemic Control (2015)  Target Ranges:  Prepandial:   less than 140 mg/dL      Peak postprandial:   less than 180 mg/dL (1-2 hours)      Critically ill patients:  140 - 180 mg/dL   Review of Glycemic Control:  Results for Kenneth Mcfarland, Kenneth Mcfarland (MRN [id number]) as of 05/15/2015 14:41  Ref. Range 05/14/2015 16:57 05/14/2015 21:41 05/15/2015 08:11 05/15/2015 12:27  Glucose-Capillary Latest Ref Range: 65-99 mg/dL 657 (H) 846 (H) 962 (H) 305 (H)  Results for Kenneth Mcfarland, Kenneth Mcfarland (MRN [id number]) as of 05/15/2015 14:41  Ref. Range 02/24/2015 17:14  Hemoglobin A1C Unknown 7.0    Diabetes history: Type 2 diabetes Outpatient Diabetes medications: Metformin 500 mg bid Current orders for Inpatient glycemic control:  Lantus 10 units daily, Novolog resistant tid with meals and HS Note patient also receiving Solumedrol 1000 mg daily which is likely increasing blood sugars  Inpatient Diabetes Program Recommendations:    Please consider increasing Novolog correction to q 4 hours while patient is on Solumedrol.  If CBG's remain greater than 180 mg/dL, consider increasing Lantus to 30 units daily (0.2 units/kg).   Thanks, Beryl Meager, RN, BC-ADM Inpatient Diabetes Coordinator Pager [phone] (8a-5p)

## 2015-05-15 NOTE — Progress Notes (Addendum)
S: subjective improvement in pain and swelling  O:   VAN: OD: 3 pt [place] OS: 14 pt [place] much improved  CVF: OD: full to CF OS: full to CF  Pupils: OD: 3->2 no APD OS: 3-> 2 no APD  EOM: OD: full OS: limited abduction/adduction/depression likely due to mechanical from chemosis   Pen Light: L/L: OD: WNL OS: WNL - no lid erythema or edema, no tenderness  C/S: OD: W/Q OS: 3+ prolapse chemosis, ? Keratinization developing  K: OD: clear OS: clear  A/C: OD: grossly deep OS: grossly deep  I:  OD: round and regular OS: round and regular  L: OD: NSC OS: NSC  A/P:  1. Orbital Cellulitis OS: - Agree with switching to PO antibiotics and short course PO Prednisone taper for 1 week - Discussed would have expected much more improvement in exam given IV Abx and IV Steroids - possibly not orbital cellulitis but recommend treating empirically - Discussed would recommend discharge home likely tomorrow if medically cleared  - Instructed patient to call me over the weekend directly on cell phone if acutely worsening and will see patient after hours in clinic over the weekend  2. Chemosis OS: - Discussed that swelling of conjunctiva should be improved by now w/ steroids and that exposure and desiccation could be exacerbating and preventing from resolving - Discussed options of conjunctival biopsy and attempted drainage of chemosis at bedside but patient declines - Discussed that if this does not resolve on own by Monday will need to proceed w/ biopsy and drainage and will perform in the office  - Recommend discharge on Tobradex ointment QID OS  Dispo:  - Scheduled outpatient follow-up for Monday at 8 AM at [facility] on [address] - Instructed patient to call me directly on my cell phone should he acutely worsen over the weekend and NOT go to the ER as will open clinic to see him outpatient  Raedyn Wenke T. Sherryll Burger, MD [facility] [phone]

## 2015-05-16 LAB — IGG, IGA, IGM
IGA: 378 mg/dL (ref 90–386)
IGM, SERUM: 124 mg/dL (ref 20–172)
IgG (Immunoglobin G), Serum: 1700 mg/dL — ABNORMAL HIGH (ref 700–1600)

## 2015-05-16 LAB — GLUCOSE, CAPILLARY
GLUCOSE-CAPILLARY: 312 mg/dL — AB (ref 65–99)
GLUCOSE-CAPILLARY: 389 mg/dL — AB (ref 65–99)
Glucose-Capillary: 444 mg/dL — ABNORMAL HIGH (ref 65–99)
Glucose-Capillary: 296 mg/dL — ABNORMAL HIGH (ref 65–99)

## 2015-05-16 LAB — ANGIOTENSIN CONVERTING ENZYME: ANGIOTENSIN-CONVERTING ENZYME: 19 U/L (ref 14–82)

## 2015-05-16 LAB — IGE: IgE (Immunoglobulin E), Serum: 621 IU/mL — ABNORMAL HIGH (ref 0–100)

## 2015-05-16 MED ORDER — INSULIN ASPART 100 UNIT/ML ~~LOC~~ SOLN
4.0000 [IU] | Freq: Three times a day (TID) | SUBCUTANEOUS | Status: DC
  Administered 2015-05-16 – 2015-05-17 (×4): 4 [IU] via SUBCUTANEOUS

## 2015-05-16 MED ORDER — INSULIN GLARGINE 100 UNIT/ML ~~LOC~~ SOLN
20.0000 [IU] | Freq: Every day | SUBCUTANEOUS | Status: DC
  Administered 2015-05-16: 20 [IU] via SUBCUTANEOUS
  Filled 2015-05-16 (×2): qty 0.2

## 2015-05-16 MED ORDER — SULFAMETHOXAZOLE-TRIMETHOPRIM 800-160 MG PO TABS
1.0000 | ORAL_TABLET | Freq: Two times a day (BID) | ORAL | Status: DC
  Administered 2015-05-16 – 2015-05-17 (×3): 1 via ORAL
  Filled 2015-05-16 (×3): qty 1

## 2015-05-16 MED ORDER — LEVOFLOXACIN 750 MG PO TABS
750.0000 mg | ORAL_TABLET | Freq: Every day | ORAL | Status: DC
  Administered 2015-05-16 – 2015-05-17 (×2): 750 mg via ORAL
  Filled 2015-05-16 (×2): qty 1

## 2015-05-16 MED ORDER — PREDNISONE 10 MG PO TABS
60.0000 mg | ORAL_TABLET | Freq: Every day | ORAL | Status: DC
  Administered 2015-05-17: 60 mg via ORAL
  Filled 2015-05-16: qty 1

## 2015-05-16 NOTE — Progress Notes (Signed)
PT stated can place self on and off cpap for the night, RT to monitor as needed

## 2015-05-16 NOTE — Progress Notes (Signed)
Pt not in room.

## 2015-05-16 NOTE — Progress Notes (Signed)
Subjective: No acute events overnight. He reports he is still having pain and blurry vision in his left eye.   Objective: Vital signs in last 24 hours: Filed Vitals:   05/15/15 0552 05/15/15 1330 05/15/15 2028 05/16/15 0421  BP: 149/105 149/92 169/99 139/97  Pulse: 87 86 86 87  Temp: 98.3 F (36.8 C) 98.7 F (37.1 C) 98.3 F (36.8 C) 98 F (36.7 C)  TempSrc: Oral Oral Oral Oral  Resp: Height:      Weight:      SpO2: 97% 98% 98% 98%   Weight change:   Intake/Output Summary (Last 24 hours) at 05/16/15 0914 Last data filed at 05/15/15 1816  Gross per 24 hour  Intake    600 ml  Output      0 ml  Net    600 ml    General: obese man, sitting up in bed, NAD HEENT: miosis of left eye, swelling of inferior conjunctiva with erythema, granulomatous-like appearance to conjunctiva Abd: soft, obese Ext: warm and well perfused, swollen legs, but no pitting edema Neuro: alert and oriented X3  Lab Results: Basic Metabolic Panel:  Recent Labs Lab 05/13/15 0920 05/15/15 0518  NA 140 135  K 4.7 4.8  CL 100* 100*  CO2 32 29  GLUCOSE 191* 337*  BUN 11 11  CREATININE 1.16 1.03  CALCIUM 9.0 8.7*   Liver Function Tests:  Recent Labs Lab 05/10/15 2006  AST 24  ALT 22  ALKPHOS 81  BILITOT 0.2*  PROT 7.1  ALBUMIN 3.3*   No results for input(s): LIPASE, AMYLASE in the last 168 hours. No results for input(s): AMMONIA in the last 168 hours. CBC:  Recent Labs Lab 05/13/15 0920 05/15/15 0518  WBC 5.7 11.9*  NEUTROABS 3.2 10.8*  HGB 16.0 16.1  HCT 51.8 52.6*  MCV 88.9 89.2  PLT 179 184   CBG:  Recent Labs Lab 05/14/15 2141 05/15/15 0811 05/15/15 1227 05/15/15 1713 05/15/15 2112 05/16/15 0750  GLUCAP 380* 294* 305* 393* 387* 312*   Thyroid Function Tests:  Recent Labs Lab 05/13/15 1804  TSH 2.125   Micro Results: Recent Results (from the past 240 hour(s))  Culture, blood (routine x 2)     Status: None (Preliminary result)   Collection  Time: 05/13/15 11:19 AM  Result Value Ref Range Status   Specimen Description BLOOD BLOOD RIGHT FOREARM  Final   Special Requests BOTTLES DRAWN AEROBIC AND ANAEROBIC 5CC  Final   Culture NO GROWTH 2 DAYS  Final   Report Status PENDING  Incomplete  Culture, blood (routine x 2)     Status: None (Preliminary result)   Collection Time: 05/13/15 11:24 AM  Result Value Ref Range Status   Specimen Description BLOOD RIGHT ANTECUBITAL  Final   Special Requests BOTTLES DRAWN AEROBIC AND ANAEROBIC 10CC  Final   Culture NO GROWTH 2 DAYS  Final   Report Status PENDING  Incomplete   Studies/Results: Dg Chest 2 View  05/15/2015  CLINICAL DATA:  Shortness of breath. EXAM: CHEST  2 VIEW COMPARISON:  01/25/2015 FINDINGS: Mild peribronchial thickening. Heart and mediastinal contours are within normal limits. No focal opacities or effusions. No acute bony abnormality. IMPRESSION: Mild bronchitic changes. Electronically Signed   By: Charlett Nose M.D.   On: 05/15/2015 11:35   Medications: Scheduled Meds: . enoxaparin (LOVENOX) injection  80 mg Subcutaneous Q24H  . gabapentin  300 mg Oral BID  . insulin aspart  0-20 Units Subcutaneous  TID WC  . insulin aspart  0-5 Units Subcutaneous QHS  . insulin glargine  10 Units Subcutaneous QHS  . levofloxacin  750 mg Oral Daily  . lisinopril  5 mg Oral Daily  . methylPREDNISolone (SOLU-MEDROL) injection  1,000 mg Intravenous Daily  . nicotine  7 mg Transdermal Daily  . pantoprazole  40 mg Oral Daily  . [START ON 05/17/2015] predniSONE  60 mg Oral Q breakfast  . sulfamethoxazole-trimethoprim  1 tablet Oral Q12H  . tobramycin-dexamethasone   Left Eye Q4H while awake   Continuous Infusions:  PRN Meds:.albuterol, oxyCODONE Assessment/Plan:  Possible Orbital Cellulitis: Presentation suggestive of orbital cellulitis with orbital signs: chemosis, limitations of eye movement, conjunctiva swelling, blurry vision.  MRI findings as above. Other possibilities include  granulomatous conjunctivitis vs sarcoidosis vs thyroid disease. However, TSH and thyrotropin receptor antibody are normal. Thyroid stimulating immunoglobulin is pending. His IgG level is slightly high at 1700 and IgE level elevated at 621 possibly suggesting an allergic component to his disease. No mediastinal adenopathy on CXR and ACE level pending to evaluate for sarcoidosis. His eye appears to be improving with the IV antibiotics and IV solumedrol. Per Ophtho's recommendations, we will transition him to oral antibiotics and steroids today and see how he does with these regimen. Likely home tomorrow with close Ophtho follow up on Monday.   - Ophthalmology following, appreciate their consultation - Discontinue IV antibiotics - Switch to Bactrim 800-160 mg Q12H and Levofloxacin 750 mg daily to cover for MRSA, strep, and gram negative bacilli. Will use fluoroquinolone instead of cephalosporin due to PCN allergy. - Solu-Medrol 1gm IV today, then transition to Prednisone 60 mg tomorrow and taper over 1 week  - Tobradex eye ointment q4h - Blood cultures with no growth   Diabetes Mellitus: Hgb A1C 7 in 02/2015.  On Metformin at home.  No evidence of DM retinopathy. CBGs elevated likely due to high dose steroids.  - Increase Lantus to 20 units - Continue resistant sliding scale  - Continue Gabapentin  HTN: Stable, home meds are Lisinopril  daily. No longer on Lasix  BID for leg swelling. - Continue lisinopril  daily  Chronic Pain: on pain meds oxycodone  PRN daily for chronic knee pain. - Continue with  q6h prn and monitor  ? Reactive [place] Disease: patient says he gets SOB and starts wheezing with exercise, has not been diagnsoed with any form of asthma, but inhalers he uses help. - Albuterol inh Q6H PRN  OSA: Diagnosed with OSA, but has not been able to afford CPAP. - Continue CPAP inpatient  Tobacco Abuse: smokes 2-3 cigs per day. - Nicotine patch.  Proptosis: Bilateral,  patient says his eyes have always protuded and stable since 2009. TSH- 02/2015- normal 0.717 - repeat TSH at admission normal - Thyrotropin receptor antibodies normal - Awaiting thyroid stimulating immunoglobulin   FEN Fluids: none Electrolytes: replace as needed  Nutrition: heart healthy  DVT PPx: Lovenox Code: FULL Dispo: Likely home tomorrow   The patient does have a current PCP Lavinia Sharps, NP) and does not need an [facility] hospital follow-up appointment after discharge.  The patient does not have transportation limitations that hinder transportation to clinic appointments.  .Services Needed at time of discharge: Y = Yes, Blank = No PT:   OT:   RN:   Equipment:   Other:     LOS: 3 days   Su Hoff, MD 05/16/2015, 9:14 AM

## 2015-05-16 NOTE — Progress Notes (Signed)
CBG=444. Dr. Jeanene Erb, will keep as is for now and continue to monitor.

## 2015-05-17 LAB — CBC
HCT: 51.4 % (ref 39.0–52.0)
HEMOGLOBIN: 16.4 g/dL (ref 13.0–17.0)
MCH: 27.7 pg (ref 26.0–34.0)
MCHC: 31.9 g/dL (ref 30.0–36.0)
MCV: 86.8 fL (ref 78.0–100.0)
Platelets: 184 10*3/uL (ref 150–400)
RBC: 5.92 MIL/uL — AB (ref 4.22–5.81)
RDW: 14 % (ref 11.5–15.5)
WBC: 16.2 10*3/uL — ABNORMAL HIGH (ref 4.0–10.5)

## 2015-05-17 LAB — GLUCOSE, CAPILLARY
Glucose-Capillary: 315 mg/dL — ABNORMAL HIGH (ref 65–99)
Glucose-Capillary: 298 mg/dL — ABNORMAL HIGH (ref 65–99)

## 2015-05-17 MED ORDER — NICOTINE 14 MG/24HR TD PT24
14.0000 mg | MEDICATED_PATCH | Freq: Every day | TRANSDERMAL | Status: DC
  Administered 2015-05-17: 14 mg via TRANSDERMAL
  Filled 2015-05-17: qty 1

## 2015-05-17 MED ORDER — OXYCODONE HCL 5 MG PO TABS: 5.0000 mg | ORAL_TABLET | Freq: Four times a day (QID) | ORAL | Status: AC | PRN

## 2015-05-17 MED ORDER — SULFAMETHOXAZOLE-TRIMETHOPRIM 800-160 MG PO TABS: 1.0000 | ORAL_TABLET | Freq: Two times a day (BID) | ORAL | Status: AC

## 2015-05-17 MED ORDER — SULFAMETHOXAZOLE-TRIMETHOPRIM 800-160 MG PO TABS
1.0000 | ORAL_TABLET | Freq: Two times a day (BID) | ORAL | Status: DC
Start: 2015-05-17 — End: 2015-05-17

## 2015-05-17 MED ORDER — PANTOPRAZOLE SODIUM 40 MG PO TBEC: 40.0000 mg | DELAYED_RELEASE_TABLET | Freq: Every day | ORAL | Status: AC

## 2015-05-17 MED ORDER — PREDNISONE 10 MG PO TABS: ORAL_TABLET | ORAL | Status: DC

## 2015-05-17 MED ORDER — PREDNISONE 10 MG PO TABS: ORAL_TABLET | ORAL | Status: AC

## 2015-05-17 MED ORDER — LEVOFLOXACIN 750 MG PO TABS: 750.0000 mg | ORAL_TABLET | Freq: Every day | ORAL | Status: DC

## 2015-05-17 MED ORDER — PANTOPRAZOLE SODIUM 40 MG PO TBEC: 40.0000 mg | DELAYED_RELEASE_TABLET | Freq: Every day | ORAL | Status: DC

## 2015-05-17 MED ORDER — TOBRAMYCIN-DEXAMETHASONE 0.3-0.1 % OP OINT: TOPICAL_OINTMENT | OPHTHALMIC | Status: DC

## 2015-05-17 MED ORDER — LEVOFLOXACIN 750 MG PO TABS: 750.0000 mg | ORAL_TABLET | Freq: Every day | ORAL | Status: AC

## 2015-05-17 NOTE — Discharge Summary (Signed)
Name: Kenneth Mcfarland MRN: [id number] DOB: 02-01-1981 35 y.o. PCP: Lavinia Sharps, NP  Date of Admission: 05/13/2015  9:03 AM Date of Discharge: 05/17/2015 Attending Physician: No att. providers found  Discharge Diagnosis:  Principal Problem:   Orbital cellulitis on left Active Problems:   Major depressive disorder, recurrent severe without psychotic features (HCC)   OSA (obstructive sleep apnea)   Narcolepsy   Essential hypertension   Morbid obesity (HCC)   Type 2 diabetes mellitus with diabetic polyneuropathy, without long-term current use of insulin (HCC)   Decreased vision   Eye swelling  Discharge Medications:   Medication List    TAKE these medications        ALPRAZolam 1 MG tablet  Commonly known as:  XANAX  Take 1 mg by mouth 3 (three) times daily as needed for anxiety.     furosemide 20 MG tablet  Commonly known as:  LASIX  Take 1 tablet (20 mg total) by mouth 2 (two) times daily.     gabapentin 300 MG capsule  Commonly known as:  NEURONTIN  Take 1 capsule (300 mg total) by mouth 2 (two) times daily. Take 1 at bedtime for 1 week, then increase to twice a day.     levofloxacin 750 MG tablet  Commonly known as:  LEVAQUIN  Take 1 tablet (750 mg total) by mouth daily.  Start taking on:  05/18/2015     lisinopril 5 MG tablet  Commonly known as:  PRINIVIL,ZESTRIL  Take 1 tablet (5 mg total) by mouth daily.     MAXAIR AUTOHALER IN  Inhale 2 puffs into the lungs every 6 (six) hours as needed (shortness of breath/wheezing).     metFORMIN 500 MG tablet  Commonly known as:  GLUCOPHAGE  TAKE 1 TABLET BY MOUTH TWICE A DAY WITH MEALS     Oxycodone HCl 10 MG Tabs  Take 1 tablet (10 mg total) by mouth every 6 (six) hours as needed (pain).     pantoprazole 40 MG tablet  Commonly known as:  PROTONIX  Take 1 tablet (40 mg total) by mouth daily.     predniSONE 10 MG tablet  Commonly known as:  DELTASONE  Take  x 1 day starting 1/23, then  x 2 days, then   x 2 days, then  x 2 days, then stop taking  Start taking on:  05/18/2015     sulfamethoxazole-trimethoprim 800-160 MG tablet  Commonly known as:  BACTRIM DS,SEPTRA DS  Take 1 tablet by mouth every 12 (twelve) hours.     tobramycin-dexamethasone ophthalmic ointment  Commonly known as:  TOBRADEX  Place into the left eye every 4 (four) hours while awake.        Disposition and follow-up:   Kenneth Mcfarland was discharged from [facility] in Stable condition.    1.  At the hospital follow up visit please address:  - patient to complete prednisone taper as detailed above - patient to be on antibiotics for at least 3 weeks after discharge.  Tentative end date is February 7, but patient to follow up with ophthalmology who may increase or decrease duration of treatment based on their findings - make sure patient has followed up with ophthamology  2.  Labs / imaging needed at time of follow-up: none  3.  Pending labs/ test needing follow-up: none  Follow-up Appointments:     Follow-up Information    Schedule an appointment as soon as possible for a visit with [facility]  H, NP.   Why:  Follow up at the [facility] with Lavinia Sharps NP at the [facility] information:   [address] [place] [address] [phone]       Follow up with Elwin Mocha, MD On 05/18/2015.   Specialty:  Ophthalmology   Why:  go to your appointment tomorrow   Contact information:   [address] [place] [address] [phone]       Discharge Instructions: Discharge Instructions    Diet - low sodium heart healthy    Complete by:  As directed      Increase activity slowly    Complete by:  As directed            Consultations:    Procedures Performed:  Dg Chest 2 View  05/15/2015  CLINICAL DATA:  Shortness of breath. EXAM: CHEST  2 VIEW COMPARISON:  01/25/2015 FINDINGS: Mild peribronchial thickening. Heart and mediastinal contours  are within normal limits. No focal opacities or effusions. No acute bony abnormality. IMPRESSION: Mild bronchitic changes. Electronically Signed   By: Charlett Nose M.D.   On: 05/15/2015 11:35   Ct Head Wo Contrast  05/10/2015  CLINICAL DATA:  35 year old male with left eye redness and swelling and blurred vision. Left-sided proptosis. EXAM: CT HEAD without contrast. CT  ORBITS WITH CONTRAST TECHNIQUE: Contiguous axial images were obtained from the base of the skull through the vertex without contrast. Multidetector CT imaging of the orbits was performed using the standard protocol with intravenous contrast. CONTRAST:  75mL OMNIPAQUE IOHEXOL 300 MG/ML  SOLN COMPARISON:  Head CT dated 09/06/2007 FINDINGS: CT HEAD FINDINGS The ventricles and the sulci are appropriate in size for the patient's age. There is no intracranial hemorrhage. No midline shift or mass effect identified. The gray-white matter differentiation is preserved. The visualized paranasal sinuses and mastoid air cells are well aerated. The calvarium is intact. CT ORBITS FINDINGS The globes, retro-orbital fat, and orbital wall are preserved. The ocular musculature appear intact. There is dysconjugate gaze which may be related to strabismus. Clinical correlation is recommended the There is no fluid collection. The visualized osseous structures appear unremarkable. The paranasal sinuses are well aerated. IMPRESSION: No acute intracranial pathology. No acute of male orbital pathology. Electronically Signed   By: Elgie Collard M.D.   On: 05/10/2015 22:47   US Pelvis Limited  04/20/2015  CLINICAL DATA:  Acute onset of recurrent left groin abscess. Further evaluation requested. Initial encounter. EXAM: LIMITED ULTRASOUND OF PELVIS TECHNIQUE: Limited transabdominal ultrasound examination of the pelvis was performed. COMPARISON:  Pelvic ultrasound performed 12/30/2013 FINDINGS: A loculated complex collection of fluid is noted at the left inguinal  region, measuring approximately 3.2 x 1.8 x 2.3 cm. This is seen lateral to the scrotal sac. Trace blood flow is noted about the periphery of the abscess. This region is indurated and warm to touch. Surrounding soft tissues are otherwise grossly unremarkable. IMPRESSION: Loculated complex abscess at the left inguinal region, measuring 3.2 x 1.8 x 2.3 cm, noted lateral to the scrotal sac. Electronically Signed   By: Roanna Raider M.D.   On: 04/20/2015 22:41   Ct Orbits W/cm  05/10/2015  CLINICAL DATA:  35 year old male with left eye redness and swelling and blurred vision. Left-sided proptosis. EXAM: CT HEAD without contrast. CT  ORBITS WITH CONTRAST TECHNIQUE: Contiguous axial images were obtained from the base of the skull through the vertex without contrast. Multidetector CT imaging of the orbits was performed  using the standard protocol with intravenous contrast. CONTRAST:  75mL OMNIPAQUE IOHEXOL 300 MG/ML  SOLN COMPARISON:  Head CT dated 09/06/2007 FINDINGS: CT HEAD FINDINGS The ventricles and the sulci are appropriate in size for the patient's age. There is no intracranial hemorrhage. No midline shift or mass effect identified. The gray-white matter differentiation is preserved. The visualized paranasal sinuses and mastoid air cells are well aerated. The calvarium is intact. CT ORBITS FINDINGS The globes, retro-orbital fat, and orbital wall are preserved. The ocular musculature appear intact. There is dysconjugate gaze which may be related to strabismus. Clinical correlation is recommended the There is no fluid collection. The visualized osseous structures appear unremarkable. The paranasal sinuses are well aerated. IMPRESSION: No acute intracranial pathology. No acute of male orbital pathology. Electronically Signed   By: Elgie Collard M.D.   On: 05/10/2015 22:47   Mr Birdie Hopes Wo/w Cm  05/13/2015  CLINICAL DATA:  Redness and swelling of the left eye beginning 1 week ago. Severe pain not responding to  antibiotics. EXAM: MRI OF THE ORBITS WITHOUT AND WITH CONTRAST TECHNIQUE: Multiplanar, multisequence MR imaging of the orbits was performed both before and after the administration of intravenous contrast. CONTRAST:  20mL MULTIHANCE GADOBENATE DIMEGLUMINE 529 MG/ML IV SOLN COMPARISON:  Orbit CT from 3 days ago FINDINGS: There is left lid edema and swelling with asymmetric enhancement of the left conjunctival sac consistent with preseptal cellulitis. No postseptal inflammation or edema is contiguous. No inciting sinusitis. There may have been a lid appendage infection on the nasal side on prior CT. Mounding of the optic discs with prominent optic nerve sheath fluid and mild flattening of the posterior sclera. These findings suggest elevated pressures and papilledema. Partial intracranial imaging shows no hydrocephalus or mass effect. Pseudotumor considered given patient size. Partially visualized bilateral mastoid effusion or mucosal thickening. The extraocular muscles are diffusely thickened, especially compared to 2009. These also have a wispy T2 hyperintense appearance. Atypical pattern thyroid orbitopathy or sarcoidosis are the top considerations. Patient has proptosis on a chronic basis based on previous CT. IMPRESSION: 1. Left preseptal cellulitis. 2. Imaging findings suggestive of papilledema; correlate with pressures and funduscopic exam. Partial intracranial imaging shows no hydrocephalus or mass effect. 3. Mild diffuse thickening of the extraocular muscles compared to 2009 head CT. Thyroid orbitopathy or sarcoidosis are top considerations. Proptosis is stable from 2009. 4. Bilateral mastoid effusion. Electronically Signed   By: Marnee Spring M.D.   On: 05/13/2015 15:02    2D Echo: none  Cardiac Cath: none  Admission HPI:  Kenneth Mcfarland is a 37 Y O M with PMH of Morbid obesity, HTN, DM, Chronic pain- left knee arthritis, OSA, Narcolepsy, presented today with complaints of eye pain and swelling  that started ~1 week ago, he went to see his primary care provider and was given some eye ointments. With persistent symptoms he presented to the ED- 1/15 on Sunday, with eye redness, eye pain and swelling, he had head CT done, which was negative for orbital cellulitis, he was diagnosed with preseptal cellulitis, given a dose of antibiotic , and sent home with a prescription of clindamycin which he filled appropriately and took, for 2 days. Pt pt says he continued to have worsening pain, swelling, and redness, also with pain with eye movements, and discharge, greenish in colour, over the next 2 days. Pt also says he had increased pressure Behind his eyes- when he strained or bent down to pick something, increased sensitivity to light, also an abnormal  swelling under his eye- started 2 days ago, hence he presented to the Ed today. Pt also endorses fever- feels he was burning up and chills, feeling cold and clammy, yesterday, he did not check his temperature.   He denies recent eye trauma- had a scratched corneal Late 90s, and some remote retinal trauma when he was a kid, he denies tooth pain, foreign body in his eyes, prior eye surgery, or infection on hi sface. He says he has had exact same symptoms in the past- 2-3 years ago, for which he was admitted for 2 days at [place] and then transferred to [facility] for another 2 days, he was placed on antibiotics, but says the exact cause was never identified. Per Care everywhere, there appears to be an encounter in 2014, but i cannot see details of encounter, but Ct head with and without contrast- impression was left peri-orbital cellulitis- 02/2013.  Hospital Course by problem list: Principal Problem:   Orbital cellulitis on left Active Problems:   Major depressive disorder, recurrent severe without psychotic features (HCC)   OSA (obstructive sleep apnea)   Narcolepsy   Essential hypertension   Morbid obesity (HCC)   Type 2 diabetes mellitus with  diabetic polyneuropathy, without long-term current use of insulin (HCC)   Decreased vision   Eye swelling   Orbital Cellulitis: Presentation suggestive of orbital cellulitis with orbital signs: chemosis, limitations of eye movement, conjunctiva swelling, blurry vision. Other possibilities include granulomatous conjunctivitis vs sarcoidosis vs thyroid disease. However, TSH and thyrotropin receptor antibody are normal. Thyroid stimulating immunoglobulin is pending at time of discharge. His IgG level is slightly high at 1700 and IgE level elevated at 621 possibly suggesting an allergic component to his disease. No mediastinal adenopathy on CXR and ACE level was normal in evaluation for sarcoidosis. His eye appears to be improved off of IV antibiotics and high dose Solu-Medrol. He received 3 days of IV Vanc and Cefepime.  We will continue with oral antibiotics for at least 2 weeks and have patient follow up closely with optho the day after discharge. - Continue Bactrim 800-160 mg Q12H and Levofloxacin 750 mg daily to cover for MRSA, strep, and gram negative bacilli. Will use fluoroquinolone instead of cephalosporin due to PCN allergy. Stop date: 06/02/2015 to complete 3 total weeks of antibiotics - Transition to Prednisone 60 mg and taper over 1 week.  - Tobradex eye ointment q4h  Diabetes Mellitus: Hgb A1C 7 in 02/2015. On Metformin at home. No evidence of DM retinopathy. CBGs elevated likely due to high dose steroids. He is now off high dose steroids and beginning prednisone taper. Will discharge back on his home Metformin and recommend follow up with PCP this week.  HTN: Stable, home meds are Lisinopril  daily. No longer on Lasix  BID for leg swelling. - Continue lisinopril  daily  Proptosis: Bilateral, patient says his eyes have always protuded and stable since 2009. TSH- 02/2015- normal 0.717.  A repeat TSH at admission normal, thyrotropin receptor antibodies normal.  Awaiting thyroid  stimulating immunoglobulin at discharge.  Discharge Vitals:   BP 168/98 mmHg  Pulse 67  Temp(Src) 98.3 F (36.8 C) (Oral)  Resp 18  Ht  (1.753 m)  Wt 166.1 kg (366 lb 2.9 oz)  BMI 54.05 kg/m2  SpO2 93%  Discharge Labs:  Results for orders placed or performed during the hospital encounter of 05/13/15 (from the past 24 hour(s))  Glucose, capillary     Status: Abnormal   Collection  Time: 05/16/15 12:25 PM  Result Value Ref Range   Glucose-Capillary 389 (H) 65 - 99 mg/dL  Glucose, capillary     Status: Abnormal   Collection Time: 05/16/15  5:15 PM  Result Value Ref Range   Glucose-Capillary 444 (H) 65 - 99 mg/dL  Glucose, capillary     Status: Abnormal   Collection Time: 05/16/15  9:38 PM  Result Value Ref Range   Glucose-Capillary 296 (H) 65 - 99 mg/dL   Comment 1 Notify RN   CBC     Status: Abnormal   Collection Time: 05/17/15  5:40 AM  Result Value Ref Range   WBC 16.2 (H) 4.0 - 10.5 K/uL   RBC 5.92 (H) 4.22 - 5.81 MIL/uL   Hemoglobin 16.4 13.0 - 17.0 g/dL   HCT 40.9 81.1 - 91.4 %   MCV 86.8 78.0 - 100.0 fL   MCH 27.7 26.0 - 34.0 pg   MCHC 31.9 30.0 - 36.0 g/dL   RDW 78.2 95.6 - 21.3 %   Platelets 184 150 - 400 K/uL  Glucose, capillary     Status: Abnormal   Collection Time: 05/17/15  7:41 AM  Result Value Ref Range   Glucose-Capillary 315 (H) 65 - 99 mg/dL    Signed: Gwynn Burly, DO 05/17/2015, 10:42 AM    Services Ordered on Discharge: none Equipment Ordered on Discharge: none

## 2015-05-17 NOTE — Progress Notes (Signed)
Subjective: Patient seen and examined this morning.  No acute events overnight. He reports feeling much better.  Swelling and redness have improved.  Blurry vision and pain are also better.  He states he is okay for discharge today with follow up tomorrow with Dr. Sherryll Burger (ophthalmology).    Objective: Vital signs in last 24 hours: Filed Vitals:   05/16/15 0421 05/16/15 1423 05/16/15 2140 05/17/15 0630  BP: 139/97 149/90 156/87 168/98  Pulse: 87 88 84 67  Temp: 98 F (36.7 C) 98.5 F (36.9 C) 98.4 F (36.9 C) 98.3 F (36.8 C)  TempSrc: Oral Oral Oral Oral  Resp: Height:      Weight:      SpO2: 98% 94% 96% 93%   Weight change:   Intake/Output Summary (Last 24 hours) at 05/17/15 0909 Last data filed at 05/17/15 0544  Gross per 24 hour  Intake   1680 ml  Output      0 ml  Net   1680 ml    General: obese man, sitting up in chair, eating breakfast, NAD HEENT: swelling of inferior conjunctiva with erythema that is improved from previous days, granulomatous-like appearance to conjunctiva Abd: obese Ext: warm and well perfused, swollen legs, but no pitting edema Neuro: alert and oriented X3, no focal deficits  Lab Results: Basic Metabolic Panel:  Recent Labs Lab 05/13/15 0920 05/15/15 0518  NA 140 135  K 4.7 4.8  CL 100* 100*  CO2 32 29  GLUCOSE 191* 337*  BUN 11 11  CREATININE 1.16 1.03  CALCIUM 9.0 8.7*   Liver Function Tests:  Recent Labs Lab 05/10/15 2006  AST 24  ALT 22  ALKPHOS 81  BILITOT 0.2*  PROT 7.1  ALBUMIN 3.3*   No results for input(s): LIPASE, AMYLASE in the last 168 hours. No results for input(s): AMMONIA in the last 168 hours. CBC:  Recent Labs Lab 05/13/15 0920 05/15/15 0518 05/17/15 0540  WBC 5.7 11.9* 16.2*  NEUTROABS 3.2 10.8*  --   HGB 16.0 16.1 16.4  HCT 51.8 52.6* 51.4  MCV 88.9 89.2 86.8  PLT 179 184 184   CBG:  Recent Labs Lab 05/15/15 2112 05/16/15 0750 05/16/15 1225 05/16/15 1715 05/16/15 2138  05/17/15 0741  GLUCAP 387* 312* 389* 444* 296* 315*   Thyroid Function Tests:  Recent Labs Lab 05/13/15 1804  TSH 2.125   Micro Results: Recent Results (from the past 240 hour(s))  Culture, blood (routine x 2)     Status: None (Preliminary result)   Collection Time: 05/13/15 11:19 AM  Result Value Ref Range Status   Specimen Description BLOOD BLOOD RIGHT FOREARM  Final   Special Requests BOTTLES DRAWN AEROBIC AND ANAEROBIC 5CC  Final   Culture NO GROWTH 3 DAYS  Final   Report Status PENDING  Incomplete  Culture, blood (routine x 2)     Status: None (Preliminary result)   Collection Time: 05/13/15 11:24 AM  Result Value Ref Range Status   Specimen Description BLOOD RIGHT ANTECUBITAL  Final   Special Requests BOTTLES DRAWN AEROBIC AND ANAEROBIC 10CC  Final   Culture NO GROWTH 3 DAYS  Final   Report Status PENDING  Incomplete   Studies/Results: Dg Chest 2 View  05/15/2015  CLINICAL DATA:  Shortness of breath. EXAM: CHEST  2 VIEW COMPARISON:  01/25/2015 FINDINGS: Mild peribronchial thickening. Heart and mediastinal contours are within normal limits. No focal opacities or effusions. No acute bony abnormality. IMPRESSION: Mild bronchitic changes.  Electronically Signed   By: Charlett Nose M.D.   On: 05/15/2015 11:35   Medications: Scheduled Meds: . enoxaparin (LOVENOX) injection  80 mg Subcutaneous Q24H  . gabapentin  300 mg Oral BID  . insulin aspart  0-20 Units Subcutaneous TID WC  . insulin aspart  0-5 Units Subcutaneous QHS  . insulin aspart  4 Units Subcutaneous TID WC  . insulin glargine  20 Units Subcutaneous QHS  . levofloxacin  750 mg Oral Daily  . lisinopril  5 mg Oral Daily  . nicotine  14 mg Transdermal Daily  . pantoprazole  40 mg Oral Daily  . predniSONE  60 mg Oral Q breakfast  . sulfamethoxazole-trimethoprim  1 tablet Oral Q12H  . tobramycin-dexamethasone   Left Eye Q4H while awake   Continuous Infusions:  PRN Meds:.albuterol, oxyCODONE Assessment/Plan:  ?  Orbital Cellulitis: Presentation suggestive of orbital cellulitis with orbital signs: chemosis, limitations of eye movement, conjunctiva swelling, blurry vision. Other possibilities include granulomatous conjunctivitis vs sarcoidosis vs thyroid disease. However, TSH and thyrotropin receptor antibody are normal. Thyroid stimulating immunoglobulin is pending. His IgG level is slightly high at 1700 and IgE level elevated at 621 possibly suggesting an allergic component to his disease. No mediastinal adenopathy on CXR and ACE level was normal in evaluation for sarcoidosis. His eye appears to be improving now off of IV antibiotics and high dose Solu-Medrol. We will continue with oral antibiotics for at least 2 weeks and have patient follow up closely with optho tomorrow - Ophthalmology following, appreciate their consultation - Continue Bactrim 800-160 mg Q12H and Levofloxacin 750 mg daily to cover for MRSA, strep, and gram negative bacilli. Will use fluoroquinolone instead of cephalosporin due to PCN allergy.  Stop date: 06/02/2015 to complete 3 total weeks of antibiotics - Transition to Prednisone 60 mg today and taper over 1 week.   - Tobradex eye ointment q4h - Blood cultures with no growth   Diabetes Mellitus: Hgb A1C 7 in 02/2015.  On Metformin at home.  No evidence of DM retinopathy. CBGs elevated likely due to high dose steroids.  He is now off high dose steroids and beginning prednisone taper.  Will discharge back on his home Metformin and recommend follow up with PCP this week. - Increase Lantus to 20 units - Continue resistant sliding scale  - Continue Gabapentin  HTN: Stable, home meds are Lisinopril  daily. No longer on Lasix  BID for leg swelling. - Continue lisinopril  daily  Chronic Pain: on pain meds oxycodone  PRN daily for chronic knee pain. - Continue with  q6h prn and monitor  ? Reactive [place] Disease: patient says he gets SOB and starts wheezing with exercise, has  not been diagnsoed with any form of asthma, but inhalers he uses help. - Albuterol inh Q6H PRN  OSA: Diagnosed with OSA, but has not been able to afford CPAP. - Continue CPAP inpatient  Tobacco Abuse: smokes 2-3 cigs per day. - Nicotine patch.  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 TSH- 02/2015- normal 0.717 - repeat TSH at admission normal - Thyrotropin receptor antibodies normal - Awaiting thyroid stimulating immunoglobulin   FEN Fluids: none Electrolytes: replace as needed  Nutrition: heart healthy  DVT PPx: Lovenox Code: FULL Dispo: Home today   The patient does have a current PCP Lavinia Sharps, NP) and does not need an [facility] hospital follow-up appointment after discharge.  The patient does not have transportation limitations that hinder transportation  to clinic appointments.  .Services Needed at time of discharge: Y = Yes, Blank = No PT:   OT:   RN:   Equipment:   Other:     LOS: 4 days   Gwynn Burly, DO 05/17/2015, 9:09 AM

## 2015-05-17 NOTE — Progress Notes (Signed)
Pt ready for DC.  Copy of DC instructions given and reviewed.  Rxs given to pt and explained.  Pt informed to go to [facility] for his meds.  Pt has follow up appt with Dr Sherryll Burger tomorrow.

## 2015-05-17 NOTE — Progress Notes (Signed)
Pt wants to go outside for fresh air/smoking, he has nicotine patch 7 mg , on call MD Ladona Ridgel said he cannot go outside for smoking and change the nicotine patch from 7 to 14 mg daily.

## 2015-05-17 NOTE — Discharge Instructions (Signed)
We are sending you home with 2 antibiotics and oral Prednisone: 1. Bactrim 2. Levaquin  You will need to take these antibiotics as directed for at least 2 weeks.  Dr. Sherryll Burger will help determine the final length of antibiotic course.  For the prednisone, please follow the taper as instructed on the pill bottle over the next week.  Please resume your home metformin but watch your sugars while on the steroids.  Please schedule an appointment with your primary doctor as soon as possible to evaluate your blood sugars.

## 2015-05-18 LAB — CULTURE, BLOOD (ROUTINE X 2)
CULTURE: NO GROWTH
CULTURE: NO GROWTH

## 2015-05-18 LAB — IGG 4: IGG 4: 19 mg/dL (ref 1–291)

## 2015-05-19 ENCOUNTER — Ambulatory Visit (INDEPENDENT_AMBULATORY_CARE_PROVIDER_SITE_OTHER): Payer: Self-pay | Admitting: Neurology

## 2015-05-19 ENCOUNTER — Encounter: Payer: Self-pay | Admitting: Neurology

## 2015-05-19 VITALS — BP 170/98 | HR 92 | Resp 20 | Ht 69.0 in | Wt 360.0 lb

## 2015-05-19 DIAGNOSIS — G4733 Obstructive sleep apnea (adult) (pediatric): Secondary | ICD-10-CM

## 2015-05-19 DIAGNOSIS — J449 Chronic obstructive pulmonary disease, unspecified: Secondary | ICD-10-CM

## 2015-05-19 LAB — THYROID STIMULATING IMMUNOGLOBULIN: THYROID STIMULATING IMMUNOGLOB: 32 % (ref 0–139)
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
## 2015-05-19 NOTE — Patient Instructions (Signed)

## 2015-06-12 ENCOUNTER — Emergency Department (HOSPITAL_COMMUNITY)
Admission: EM | Admit: 2015-06-12 | Discharge: 2015-06-13 | Disposition: A | Discharge status: Home / Self Care | Payer: Self-pay | Attending: Emergency Medicine | Admitting: Emergency Medicine

## 2015-06-12 ENCOUNTER — Encounter (HOSPITAL_COMMUNITY): Payer: Self-pay | Admitting: *Deleted

## 2015-06-12 DIAGNOSIS — M79661 Pain in right lower leg: Secondary | ICD-10-CM | POA: Insufficient documentation

## 2015-06-12 DIAGNOSIS — Z8669 Personal history of other diseases of the nervous system and sense organs: Secondary | ICD-10-CM | POA: Insufficient documentation

## 2015-06-12 DIAGNOSIS — E119 Type 2 diabetes mellitus without complications: Secondary | ICD-10-CM | POA: Insufficient documentation

## 2015-06-12 DIAGNOSIS — R531 Weakness: Secondary | ICD-10-CM | POA: Insufficient documentation

## 2015-06-12 DIAGNOSIS — A084 Viral intestinal infection, unspecified: Secondary | ICD-10-CM

## 2015-06-12 DIAGNOSIS — Z7984 Long term (current) use of oral hypoglycemic drugs: Secondary | ICD-10-CM | POA: Insufficient documentation

## 2015-06-12 DIAGNOSIS — J9801 Acute bronchospasm: Secondary | ICD-10-CM

## 2015-06-12 DIAGNOSIS — F319 Bipolar disorder, unspecified: Secondary | ICD-10-CM | POA: Insufficient documentation

## 2015-06-12 DIAGNOSIS — J45901 Unspecified asthma with (acute) exacerbation: Secondary | ICD-10-CM | POA: Insufficient documentation

## 2015-06-12 DIAGNOSIS — Z880 Allergy status to penicillin: Secondary | ICD-10-CM | POA: Insufficient documentation

## 2015-06-12 DIAGNOSIS — K529 Noninfective gastroenteritis and colitis, unspecified: Secondary | ICD-10-CM | POA: Insufficient documentation

## 2015-06-12 DIAGNOSIS — M79662 Pain in left lower leg: Secondary | ICD-10-CM | POA: Insufficient documentation

## 2015-06-12 DIAGNOSIS — Z87891 Personal history of nicotine dependence: Secondary | ICD-10-CM | POA: Insufficient documentation

## 2015-06-12 DIAGNOSIS — Z79899 Other long term (current) drug therapy: Secondary | ICD-10-CM | POA: Insufficient documentation

## 2015-06-12 DIAGNOSIS — I10 Essential (primary) hypertension: Secondary | ICD-10-CM | POA: Insufficient documentation

## 2015-06-12 LAB — CBC WITH DIFFERENTIAL/PLATELET
BASOS PCT: 0 %
Basophils Absolute: 0 10*3/uL (ref 0.0–0.1)
EOS ABS: 0.3 10*3/uL (ref 0.0–0.7)
Eosinophils Relative: 3 %
HEMATOCRIT: 55.9 % — AB (ref 39.0–52.0)
HEMOGLOBIN: 18.2 g/dL — AB (ref 13.0–17.0)
LYMPHS ABS: 0.7 10*3/uL (ref 0.7–4.0)
Lymphocytes Relative: 8 %
MCH: 27.3 pg (ref 26.0–34.0)
MCHC: 32.6 g/dL (ref 30.0–36.0)
MCV: 83.9 fL (ref 78.0–100.0)
MONOS PCT: 7 %
Monocytes Absolute: 0.6 10*3/uL (ref 0.1–1.0)
NEUTROS ABS: 6.8 10*3/uL (ref 1.7–7.7)
NEUTROS PCT: 82 %
Platelets: 188 10*3/uL (ref 150–400)
RBC: 6.66 MIL/uL — AB (ref 4.22–5.81)
RDW: 14 % (ref 11.5–15.5)
WBC: 8.3 10*3/uL (ref 4.0–10.5)

## 2015-06-12 LAB — COMPREHENSIVE METABOLIC PANEL
ALK PHOS: 122 U/L (ref 38–126)
ALT: 25 U/L (ref 17–63)
ANION GAP: 13 (ref 5–15)
AST: 18 U/L (ref 15–41)
Albumin: 3.7 g/dL (ref 3.5–5.0)
BILIRUBIN TOTAL: 0.8 mg/dL (ref 0.3–1.2)
BUN: 12 mg/dL (ref 6–20)
CHLORIDE: 97 mmol/L — AB (ref 101–111)
CO2: 28 mmol/L (ref 22–32)
Calcium: 9.7 mg/dL (ref 8.9–10.3)
Creatinine, Ser: 1.22 mg/dL (ref 0.61–1.24)
GFR calc non Af Amer: >60 mL/min (ref >60)
Glucose, Bld: 391 mg/dL — ABNORMAL HIGH (ref 65–99)
POTASSIUM: 4.3 mmol/L (ref 3.5–5.1)
Sodium: 138 mmol/L (ref 135–145)
Total Protein: 7.5 g/dL (ref 6.5–8.1)

## 2015-06-12 LAB — LIPASE, BLOOD: Lipase: 35 U/L (ref 11–51)

## 2015-06-12 MED ORDER — SODIUM CHLORIDE 0.9 % IV BOLUS (SEPSIS)
1000.0000 mL | Freq: Once | INTRAVENOUS | Status: AC
  Administered 2015-06-12: 1000 mL via INTRAVENOUS

## 2015-06-12 MED ORDER — ONDANSETRON HCL 4 MG/2ML IJ SOLN
4.0000 mg | Freq: Once | INTRAMUSCULAR | Status: AC
  Administered 2015-06-12: 4 mg via INTRAVENOUS
  Filled 2015-06-12: qty 2

## 2015-06-12 NOTE — ED Notes (Signed)
Patient presents with c/o generalized weakness nausea, vomiting and diarrhea.  Stated this started today.  States he is feeling weak

## 2015-06-13 ENCOUNTER — Emergency Department (HOSPITAL_COMMUNITY): Payer: MEDICAID

## 2015-06-13 LAB — URINE MICROSCOPIC-ADD ON

## 2015-06-13 LAB — URINALYSIS, ROUTINE W REFLEX MICROSCOPIC
Bilirubin Urine: NEGATIVE
HGB URINE DIPSTICK: NEGATIVE
KETONES UR: 15 mg/dL — AB
LEUKOCYTES UA: NEGATIVE
Nitrite: NEGATIVE
PROTEIN: NEGATIVE mg/dL
Specific Gravity, Urine: 1.034 — ABNORMAL HIGH (ref 1.005–1.030)
pH: 5.5 (ref 5.0–8.0)

## 2015-06-13 MED ORDER — DICYCLOMINE HCL 10 MG/ML IM SOLN
20.0000 mg | Freq: Once | INTRAMUSCULAR | Status: AC
  Administered 2015-06-13: 20 mg via INTRAMUSCULAR
  Filled 2015-06-13: qty 2

## 2015-06-13 MED ORDER — IPRATROPIUM-ALBUTEROL 0.5-2.5 (3) MG/3ML IN SOLN
3.0000 mL | Freq: Once | RESPIRATORY_TRACT | Status: AC
  Administered 2015-06-13: 3 mL via RESPIRATORY_TRACT
  Filled 2015-06-13: qty 3

## 2015-06-13 MED ORDER — ONDANSETRON 4 MG PO TBDP: 4.0000 mg | ORAL_TABLET | Freq: Three times a day (TID) | ORAL | Status: AC | PRN

## 2015-06-13 MED ORDER — SODIUM CHLORIDE 0.9 % IV BOLUS (SEPSIS)
1000.0000 mL | Freq: Once | INTRAVENOUS | Status: AC
  Administered 2015-06-13: 1000 mL via INTRAVENOUS

## 2015-06-13 MED ORDER — DICYCLOMINE HCL 20 MG PO TABS: 20.0000 mg | ORAL_TABLET | Freq: Two times a day (BID) | ORAL | Status: AC | PRN

## 2015-06-13 MED ORDER — MORPHINE SULFATE (PF) 4 MG/ML IV SOLN
2.0000 mg | Freq: Once | INTRAVENOUS | Status: AC
  Administered 2015-06-13: 2 mg via INTRAVENOUS
  Filled 2015-06-13: qty 1

## 2015-06-13 MED ORDER — LORATADINE 10 MG PO TABS
10.0000 mg | ORAL_TABLET | Freq: Once | ORAL | Status: AC
  Administered 2015-06-13: 10 mg via ORAL
  Filled 2015-06-13: qty 1

## 2015-06-13 MED ORDER — ALBUTEROL SULFATE HFA 108 (90 BASE) MCG/ACT IN AERS
2.0000 | INHALATION_SPRAY | Freq: Once | RESPIRATORY_TRACT | Status: AC
  Administered 2015-06-13: 2 via RESPIRATORY_TRACT
  Filled 2015-06-13: qty 6.7

## 2015-06-13 MED ORDER — METHYLPREDNISOLONE SODIUM SUCC 125 MG IJ SOLR: 125.0000 mg | Freq: Once | INTRAMUSCULAR | Status: DC

## 2015-06-13 NOTE — Discharge Instructions (Signed)

## 2015-06-13 NOTE — ED Provider Notes (Signed)
CSN: [id number]     Arrival date & time 06/12/15  2150 History   First MD Initiated Contact with Patient 06/12/15 2308     Chief Complaint  Patient presents with  . Nausea  . Emesis  . Diarrhea  . Generalized Body Aches     (Consider location/radiation/quality/duration/timing/severity/associated sxs/prior Treatment) HPI Comments: Patient is a 35 year old male with a history of hypertension, sleep apnea, PTSD, bipolar disorder, diabetes mellitus, and asthma who presents to the ED for evaluation of vomiting and diarrhea. Patient states that pain began after eating a steak, eggs, and cheese for breakfast this morning. He was taking a morning nap when he awoke due to an overwhelming feeling of nausea as well as lower abdominal pain. Patient proceeded to have an episode of emesis and watery diarrhea. He has had 8 episodes of nonbloody, nonbilious emesis since onset of his symptoms with 6 episodes of "pure water" diarrhea. Patient describes a cramping lower abdominal pain. He states that he feels generally weak and he has had some associated shortness of breath. Patient denies fevers as well as syncope. He has been unable to tolerate food or fluids since onset of his symptoms. No reported sick contacts.  The history is provided by the patient. No language interpreter was used.    Past Medical History  Diagnosis Date  . Hypertension   . Sleep apnea   . Sleep apnea   . Narcolepsy   . PTSD (post-traumatic stress disorder)   . Bipolar disorder (HCC)   . Sleep apnea   . Diabetes mellitus without complication (HCC)   . Asthma   . Swelling of eye, left 04/2015   Past Surgical History  Procedure Laterality Date  . Tonsillectomy     Family History  Problem Relation Age of Onset  . Adopted: Yes  . Breast cancer Mother   . Bone cancer Father    Social History  Substance Use Topics  . Smoking status: Former Smoker -- 0.75 packs/day for 5 years    Types: Cigarettes    Quit date: 04/13/2015   . Smokeless tobacco: Current User     Comment: uses vapor  . Alcohol Use: 0.0 oz/week    0 Standard drinks or equivalent per week     Comment: socially    Review of Systems  Constitutional: Positive for chills. Negative for fever.  Respiratory: Positive for shortness of breath.   Gastrointestinal: Positive for nausea, vomiting (x8), abdominal pain and diarrhea (x6, watery).  Neurological: Positive for weakness (generalized). Negative for syncope.  Ten systems reviewed and are negative for acute change, except as noted in the HPI.    Allergies  Bee venom; Contrast media; Wasp venom; Cherry; Penicillins; and Vicodin  Home Medications   Prior to Admission medications   Medication Sig Start Date End Date Taking? Authorizing Provider  ALPRAZolam Prudy Feeler) 1 MG tablet Take 1 mg by mouth 3 (three) times daily as needed for anxiety.  02/25/15  Yes Historical Provider, MD  furosemide (LASIX) 20 MG tablet Take 1 tablet (20 mg total) by mouth 2 (two) times daily. 03/26/15  Yes Thao P Le, DO  gabapentin (NEURONTIN) 300 MG capsule Take 1 capsule (300 mg total) by mouth 2 (two) times daily. Take 1 at bedtime for 1 week, then increase to twice a day. Patient taking differently: Take 300 mg by mouth 2 (two) times daily.  03/25/15  Yes Emi Belfast, FNP  lisinopril (PRINIVIL,ZESTRIL) 5 MG tablet Take 1 tablet (5 mg total) by  mouth daily. 03/26/15  Yes Emi Belfast, FNP  metFORMIN (GLUCOPHAGE) 500 MG tablet TAKE 1 TABLET BY MOUTH TWICE A DAY WITH MEALS Patient taking differently: Take 500 mg by mouth 2 (two) times daily with a meal.  04/24/15  Yes Emi Belfast, FNP  oxyCODONE (OXY IR/ROXICODONE) 5 MG immediate release tablet Take 1 tablet (5 mg total) by mouth every 6 (six) hours as needed (pain). Patient taking differently: Take 10 mg by mouth every 6 (six) hours as needed (pain).  05/17/15  Yes Gwynn Burly, DO  pantoprazole (PROTONIX) 40 MG tablet Take 1 tablet (40 mg total) by mouth  daily. 05/17/15  Yes Gwynn Burly, DO  pirbuterol (MAXAIR) 200 MCG/INH inhaler Inhale 2 puffs into the lungs every 6 (six) hours as needed for wheezing.   Yes Historical Provider, MD   BP 121/57 mmHg  Pulse 99  Temp(Src) 98.2 F (36.8 C) (Oral)  Resp 22  Ht 5\' 9"  (1.753 m)  Wt 157.398 kg  BMI 51.22 kg/m2  SpO2 92%   Physical Exam  Constitutional: He is oriented to person, place, and time. He appears well-developed and well-nourished. No distress.  Nontoxic/nonseptic appearing  HENT:  Head: Normocephalic and atraumatic.  Mildly dry mm  Eyes: Conjunctivae and EOM are normal. No scleral icterus.  Proptosis, chronic.  Neck: Normal range of motion.  Pulmonary/Chest: Effort normal. No respiratory distress. He has wheezes.  Diffuse expiratory wheezing. No tachypnea or dyspnea. Chest expansion symmetric.  Abdominal: Soft. He exhibits no distension. There is tenderness. There is no rebound.  Mild TTP in b/l lower quadrants. No peritoneal signs. Exam limited secondary to body habitus.  Musculoskeletal: Normal range of motion.  Neurological: He is alert and oriented to person, place, and time. He exhibits normal muscle tone. Coordination normal.  Ambulates with steady gait, unassisted.   Skin: Skin is warm and dry. No rash noted. He is not diaphoretic. No erythema. No pallor.  Psychiatric: He has a normal mood and affect. His behavior is normal.  Nursing note and vitals reviewed.   ED Course  Procedures (including critical care time) Labs Review Labs Reviewed  COMPREHENSIVE METABOLIC PANEL - Abnormal; Notable for the following:    Chloride 97 (*)    Glucose, Bld 391 (*)    All other components within normal limits  CBC WITH DIFFERENTIAL/PLATELET - Abnormal; Notable for the following:    RBC 6.66 (*)    Hemoglobin 18.2 (*)    HCT 55.9 (*)    All other components within normal limits  LIPASE, BLOOD  URINALYSIS, ROUTINE W REFLEX MICROSCOPIC (NOT AT [facility])    Imaging Review Dg  Chest 2 View  06/13/2015  CLINICAL DATA:  Generalized nausea, weakness, vomiting and diarrhea beginning today. History of hypertension and diabetes. EXAM: CHEST  2 VIEW COMPARISON:  Chest radiograph May 23, 2015 FINDINGS: The heart size and mediastinal contours are within normal limits. Both lungs are clear. Similar mild bronchitic changes. The visualized skeletal structures are unremarkable. Large body habitus. IMPRESSION: Similar mild bronchitic changes without focal consolidation. Electronically Signed   By: Awilda Metro M.D.   On: 06/13/2015 00:44   I have personally reviewed and evaluated these images and lab results as part of my medical decision-making.   EKG Interpretation None       1:31 AM Patient has tolerated water and ginger ale as well as ice without emesis. DuoNeb given for wheezing. Suspect acute bronchospasm. Xray negative for PNA. It is likely that the patient's frequent  emesis exacerbated his asthma. Patient with desat to 85% on RA when sleeping. He has known hx of sleep apnea. SpO2 95% on RA when carrying on conversation with this Clinical research associate. Will give additional DuoNeb tx. Anticipate d/c after this. Patient states, "I feel a whole lot better than when I came in here".  2:00 AM Tachycardia likely secondary to DuoNeb x 2 MDM   Final diagnoses:  Viral gastroenteritis  Acute bronchospasm    Patient with symptoms consistent with viral gastroenteritis. Vitals are stable, no fever. Patient tolerating PO fluids x 3. Lungs with mild diffuse expiratory wheeze, without signs of respiratory distress. This has improved with a DuoNeb x 2 given in the ED. Xray shows mild bronchitic changes without PNA. No pleural effusion or PTA. No focal abdominal pain or peritoneal signs. Low suspicion for appendicitis, cholecystitis, pancreatitis, ruptured viscus, UTI, kidney stone, or other emergent abdominal etiology. Patient stable for discharge with instruction given for PCP f/u. Return  precautions discussed and provided. Patient discharged in good condition with no unaddressed concerns.   Filed Vitals:   06/13/15 0015 06/13/15 0109 06/13/15 0115 06/13/15 0153  BP: 133/60 119/80 126/62 107/68  Pulse: 102 98 99 109  Temp:      TempSrc:      Resp:  20  20  Height:      Weight:      SpO2: 93% 100% 98% 96%     Antony Madura, PA-C 06/13/15 0343  Linwood Dibbles, MD 06/13/15 [phone]

## 2015-06-13 NOTE — ED Notes (Signed)
Pt departed in wheelchair, escorted by RN, and in NAD.

## 2015-06-18 ENCOUNTER — Encounter: Payer: Self-pay | Admitting: Dietician

## 2015-06-18 ENCOUNTER — Encounter: Payer: MEDICAID | Attending: Family Medicine | Admitting: Dietician

## 2015-06-18 VITALS — Ht 69.0 in | Wt 341.8 lb

## 2015-06-18 DIAGNOSIS — E118 Type 2 diabetes mellitus with unspecified complications: Secondary | ICD-10-CM

## 2015-06-18 DIAGNOSIS — E1142 Type 2 diabetes mellitus with diabetic polyneuropathy: Secondary | ICD-10-CM | POA: Insufficient documentation

## 2015-06-18 NOTE — Progress Notes (Signed)
Diabetes Self-Management Education  Visit Type: Follow-up  Appt. Start Time: 1330 Appt. End Time: 1400  06/18/2015  [name], identified by name and date of birth, is a 35 y.o. male with a diagnosis of Diabetes:  Type 2.   ASSESSMENT  Height  (1.753 m), weight 341 lb 12.8 oz (155.039 kg).  Down approximately 22 lbs since our last appointment 3 months ago. Body mass index is 50.45 kg/(m^2).      Diabetes Self-Management Education - 06/18/15 1555    Health Coping   How would you rate your overall health? Fair  Reports that he is sleeping better now that he has a CPAP and is wearing it at night   Psychosocial Assessment   Self-care barriers Impaired vision   Other persons present Patient   Patient Concerns Weight Control;Support;Healthy Lifestyle   Learning Readiness Change in progress   Dietary Intake   Breakfast  Cheesy grits, eggs, fruit cup, unsweetened tea, coffee sweetened with Equal instead of sugar   Lunch Baked fish with salad flavoring foods with onions, garlic, cayenne, fresh herbs; focusing on choosing more baked fish at least 2-3x/week   Snack (afternoon) unsweetened apple sauce or apple, orange, or banana   Dinner Jerk chicken or fish with rice or baked sweet potato and collard greens or spinach or green beans reports limiting fried foods to 1x/week, doing more pressure cooking and having balanced meals of protein, starch, and veggies   Beverage(s) hot green tea with lemon, water, Gingerale or Sprite (one 2 liter per week), some cranberry juice and OJ   Patient Education   Personal strategies to promote health -- Patient has cut back on smoking - from 1 PPD to 1 pack per week   Individualized Goals (developed by patient)   Nutrition General guidelines for healthy choices and portions discussed   Physical Activity Exercise 5-7 days per week;45 minutes per day   Medications take my medication as prescribed   Reducing Risk stop smoking;get labs drawn   Instructed him to get A1c test done at next doctor's visit   Health Coping discuss diabetes with (comment)  consider attending T2DM support group, provided information flyer   Patient Self-Evaluation of Goals - Patient rates self as meeting previously set goals (% of time)   Nutrition >75%   Physical Activity 50 - 75 %   Medications >75%   Monitoring >75%   Problem Solving >75%   Reducing Risk >75%   Health Coping >75%   Outcomes   Expected Outcomes Demonstrated interest in learning. Expect positive outcomes   Future DMSE 6 months   Subsequent Visit   Weight Loss (lbs) 22.2 lbs in last 3 months      Individualized Plan for Diabetes Self-Management Training:   Learning Objective:  Patient will have a greater understanding of diabetes self-management. Patient education plan is to attend individual and/or group sessions per assessed needs and concerns. Patient expresses great commitment and motivation to keep up the changes he has made to his food choices and activity levels.  Expected Outcomes:  Demonstrated interest in learning. Expect positive outcomes  Education material provided: Support group flyer  If problems or questions, patient to contact team via:  Phone and Email  Future DSME appointment: 6 months

## 2015-07-01 ENCOUNTER — Ambulatory Visit: Payer: Self-pay | Admitting: Family Medicine

## 2015-07-22 ENCOUNTER — Ambulatory Visit: Payer: BLUE CROSS/BLUE SHIELD | Admitting: Family Medicine

## 2015-11-06 ENCOUNTER — Encounter (HOSPITAL_COMMUNITY): Payer: Self-pay | Admitting: *Deleted

## 2015-11-06 ENCOUNTER — Emergency Department (HOSPITAL_COMMUNITY)
Admission: EM | Admit: 2015-11-06 | Discharge: 2015-11-06 | Disposition: A | Discharge status: Home / Self Care | Payer: Medicaid Other | Attending: Emergency Medicine | Admitting: Emergency Medicine

## 2015-11-06 DIAGNOSIS — F17210 Nicotine dependence, cigarettes, uncomplicated: Secondary | ICD-10-CM | POA: Insufficient documentation

## 2015-11-06 DIAGNOSIS — I10 Essential (primary) hypertension: Secondary | ICD-10-CM | POA: Diagnosis not present

## 2015-11-06 DIAGNOSIS — Z7984 Long term (current) use of oral hypoglycemic drugs: Secondary | ICD-10-CM | POA: Insufficient documentation

## 2015-11-06 DIAGNOSIS — E119 Type 2 diabetes mellitus without complications: Secondary | ICD-10-CM | POA: Insufficient documentation

## 2015-11-06 DIAGNOSIS — S61012A Laceration without foreign body of left thumb without damage to nail, initial encounter: Secondary | ICD-10-CM | POA: Diagnosis not present

## 2015-11-06 DIAGNOSIS — Y939 Activity, unspecified: Secondary | ICD-10-CM | POA: Insufficient documentation

## 2015-11-06 DIAGNOSIS — J45909 Unspecified asthma, uncomplicated: Secondary | ICD-10-CM | POA: Diagnosis not present

## 2015-11-06 DIAGNOSIS — W268XXA Contact with other sharp object(s), not elsewhere classified, initial encounter: Secondary | ICD-10-CM | POA: Insufficient documentation

## 2015-11-06 DIAGNOSIS — Y990 Civilian activity done for income or pay: Secondary | ICD-10-CM | POA: Diagnosis not present

## 2015-11-06 DIAGNOSIS — Y929 Unspecified place or not applicable: Secondary | ICD-10-CM | POA: Insufficient documentation

## 2015-11-06 MED ORDER — TETANUS-DIPHTH-ACELL PERTUSSIS 5-2.5-18.5 LF-MCG/0.5 IM SUSP
0.5000 mL | Freq: Once | INTRAMUSCULAR | Status: AC
  Administered 2015-11-06: 0.5 mL via INTRAMUSCULAR
  Filled 2015-11-06: qty 0.5

## 2015-11-06 MED ORDER — ACETAMINOPHEN 500 MG PO TABS
1000.0000 mg | ORAL_TABLET | Freq: Once | ORAL | Status: AC
Start: 2015-11-06 — End: 2015-11-06
  Administered 2015-11-06: 1000 mg via ORAL
  Filled 2015-11-06: qty 2

## 2015-11-06 NOTE — ED Notes (Signed)
PA-C at bedside, to see and assess patient before RN assessment. See PA note.

## 2015-11-06 NOTE — ED Notes (Addendum)
Pt reports cutting L thumb on metal tray, bleeding controlled upon arrival to ED, pt has superficial cut, with 1/2 cm lac to L thumb, pt A&O x4

## 2015-11-06 NOTE — ED Provider Notes (Signed)
CSN: [id number]     Arrival date & time 11/06/15  1825 History  By signing my name below, I, Bridgette Habermann, attest that this documentation has been prepared under the direction and in the presence of [employer], PA-C. Electronically Signed: Bridgette Habermann, ED Scribe. 11/06/2015. 9:27 PM.    Chief Complaint  Patient presents with  . Extremity Laceration    The history is provided by the patient. No language interpreter was used.    HPI Comments: Kenneth Mcfarland is a 35 y.o. male who presents to the Emergency Department complaining of a laceration to left thumb onset today. Pt cut his thumb on a metal tray at work. Pt was treated with first aid at work and wrapped. Bleeding was controlled PTA and at triage. Pt is able to Move all fingers without difficulty. Pt's last Tdap was 5 years ago. Pt denies fever, numbness or weakness. Pt has no other complaints at this time, discomfort is rated as mild.   Past Medical History  Diagnosis Date  . Hypertension   . Sleep apnea   . Sleep apnea   . Narcolepsy   . PTSD (post-traumatic stress disorder)   . Bipolar disorder (HCC)   . Sleep apnea   . Diabetes mellitus without complication (HCC)   . Asthma   . Swelling of eye, left 04/2015   Past Surgical History  Procedure Laterality Date  . Tonsillectomy     Family History  Problem Relation Age of Onset  . Adopted: Yes  . Breast cancer Mother   . Bone cancer Father    Social History  Substance Use Topics  . Smoking status: Current Every Day Smoker -- 0.75 packs/day for 5 years    Types: Cigarettes    Last Attempt to Quit: 04/13/2015  . Smokeless tobacco: Current User     Comment: uses vapor  . Alcohol Use: 0.0 oz/week    0 Standard drinks or equivalent per week     Comment: socially    Review of Systems A complete 10 system review of systems was obtained and all systems are negative except as noted in the HPI and PMH.    Allergies  Bee venom; Contrast media; Wasp venom; Cherry;  Penicillins; and Vicodin  Home Medications   Prior to Admission medications   Medication Sig Start Date End Date Taking? Authorizing Provider  ALPRAZolam Prudy Feeler) 1 MG tablet Take 1 mg by mouth 3 (three) times daily as needed for anxiety.  02/25/15   Historical Provider, MD  dicyclomine (BENTYL) 20 MG tablet Take 1 tablet (20 mg total) by mouth 2 (two) times daily as needed (for abdominal pain). 06/13/15   Antony Madura, PA-C  furosemide (LASIX) 20 MG tablet Take 1 tablet (20 mg total) by mouth 2 (two) times daily. 03/26/15   Thao P Le, DO  gabapentin (NEURONTIN) 300 MG capsule Take 1 capsule (300 mg total) by mouth 2 (two) times daily. Take 1 at bedtime for 1 week, then increase to twice a day. Patient taking differently: Take 300 mg by mouth 2 (two) times daily.  03/25/15   Emi Belfast, FNP  lisinopril (PRINIVIL,ZESTRIL) 5 MG tablet Take 1 tablet (5 mg total) by mouth daily. 03/26/15   Emi Belfast, FNP  metFORMIN (GLUCOPHAGE) 500 MG tablet TAKE 1 TABLET BY MOUTH TWICE A DAY WITH MEALS Patient taking differently: Take 500 mg by mouth 2 (two) times daily with a meal.  04/24/15   Emi Belfast, FNP  ondansetron (ZOFRAN ODT)  4 MG disintegrating tablet Take 1 tablet (4 mg total) by mouth every 8 (eight) hours as needed for nausea or vomiting. 06/13/15   Antony MaduraKelly Humes, PA-C  oxyCODONE (OXY IR/ROXICODONE) 5 MG immediate release tablet Take 1 tablet (5 mg total) by mouth every 6 (six) hours as needed (pain). Patient taking differently: Take 10 mg by mouth every 6 (six) hours as needed (pain).  05/17/15   Gwynn BurlyAndrew Wallace, DO  pantoprazole (PROTONIX) 40 MG tablet Take 1 tablet (40 mg total) by mouth daily. 05/17/15   Gwynn BurlyAndrew Wallace, DO  pirbuterol (MAXAIR) 200 MCG/INH inhaler Inhale 2 puffs into the lungs every 6 (six) hours as needed for wheezing.    Historical Provider, MD   BP 147/77 mmHg  Pulse 92  Temp(Src) 98.8 F (37.1 C) (Oral)  Resp 18  SpO2 95% Physical Exam  Constitutional: He  appears well-developed and well-nourished.  Awake, alert, nontoxic appearance. Obese African-American male  HENT:  Head: Normocephalic and atraumatic.  Eyes: Conjunctivae are normal. Right eye exhibits no discharge. Left eye exhibits no discharge.  Neck: Neck supple.  Cardiovascular: Normal rate.   Pulmonary/Chest: Effort normal. No respiratory distress. He exhibits no tenderness.  Abdominal: Soft. He exhibits no distension. There is no tenderness. There is no rebound.  Musculoskeletal: Normal range of motion. He exhibits no tenderness.  Baseline ROM, no obvious new focal weakness.  Neurological: He is alert.  Mental status and motor strength appears baseline for patient and situation.  Skin: Skin is warm and dry. No rash noted.  Left thumb: Transverse laceration approximately 1 cm proximal to the aspect of IP joint. No obvious tendinous involvement. Patient able to flex and extend without difficulty or pain.  Psychiatric: He has a normal mood and affect. His behavior is normal.  Nursing note and vitals reviewed.   ED Course  Procedures  LACERATION REPAIR PROCEDURE NOTE The patient's identification was confirmed and consent was obtained. This procedure was performed by Joycie PeekBenjamin Kateria Cutrona, PA-C at 10:08 PM. Site: Left thumb Sterile procedures observed: Betadine  Length: 1cm Technique: Dermabond Tetanus UTD Site anesthetized, irrigated with NS, explored without evidence of foreign body, wound well approximated, site covered with dry, sterile dressing. Patient tolerated procedure well without complications. Instructions for care discussed verbally and patient provided with additional written instructions for homecare and f/u.  DIAGNOSTIC STUDIES: Oxygen Saturation is 93% on RA, poor by my interpretation.    COORDINATION OF CARE: 9:27 PM Discussed treatment plan with pt at bedside which includes laceration repair and pt agreed to plan.  MDM  Patient with laceration sustained just  prior to arrival. Patient refuses closure with sutures. Agrees to closure with Dermabond and follow-up for wound recheck with PCP next week. Tetanus updated in ED. Remains neurovascularly intact. Appropriate for discharge. Final diagnoses:  Thumb laceration, left, initial encounter    I personally performed the services described in this documentation, which was scribed in my presence. The recorded information has been reviewed and is accurate.    Joycie PeekBenjamin Daeton Kluth, PA-C 11/06/15 2257  Bethann BerkshireJoseph Zammit, MD 11/06/15 [phone]

## 2015-11-06 NOTE — ED Notes (Signed)
Patient verbalized understanding of discharge instructions and denies any further needs or questions at this time. VS stable. Patient ambulatory with steady gait, escorted to ED entrance.   

## 2015-11-06 NOTE — Discharge Instructions (Signed)
Laceration Care, Adult  A laceration is a cut that goes through all layers of the skin. The cut also goes into the tissue that is right under the skin. Some cuts heal on their own. Others need to be closed with stitches (sutures), staples, skin adhesive strips, or wound glue. Taking care of your cut lowers your risk of infection and helps your cut to heal better.  HOW TO TAKE CARE OF YOUR CUT  For stitches or staples:  · Keep the wound clean and dry.  · If you were given a bandage (dressing), you should change it at least one time per day or as told by your doctor. You should also change it if it gets wet or dirty.  · Keep the wound completely dry for the first 24 hours or as told by your doctor. After that time, you may take a shower or a bath. However, make sure that the wound is not soaked in water until after the stitches or staples have been removed.  · Clean the wound one time each day or as told by your doctor:    Wash the wound with soap and water.    Rinse the wound with water until all of the soap comes off.    Pat the wound dry with a clean towel. Do not rub the wound.  · After you clean the wound, put a thin layer of antibiotic ointment on it as told by your doctor. This ointment:    Helps to prevent infection.    Keeps the bandage from sticking to the wound.  · Have your stitches or staples removed as told by your doctor.  If your doctor used skin adhesive strips:   · Keep the wound clean and dry.  · If you were given a bandage, you should change it at least one time per day or as told by your doctor. You should also change it if it gets dirty or wet.  · Do not get the skin adhesive strips wet. You can take a shower or a bath, but be careful to keep the wound dry.  · If the wound gets wet, pat it dry with a clean towel. Do not rub the wound.  · Skin adhesive strips fall off on their own. You can trim the strips as the wound heals. Do not remove any strips that are still stuck to the wound. They will  fall off after a while.  If your doctor used wound glue:  · Try to keep your wound dry, but you may briefly wet it in the shower or bath. Do not soak the wound in water, such as by swimming.  · After you take a shower or a bath, gently pat the wound dry with a clean towel. Do not rub the wound.  · Do not do any activities that will make you really sweaty until the skin glue has fallen off on its own.  · Do not apply liquid, cream, or ointment medicine to your wound while the skin glue is still on.  · If you were given a bandage, you should change it at least one time per day or as told by your doctor. You should also change it if it gets dirty or wet.  · If a bandage is placed over the wound, do not let the tape for the bandage touch the skin glue.  · Do not pick at the glue. The skin glue usually stays on for 5-10 days. Then, it   or when wound glue stays in place and the wound is healed. Make sure to wear a sunscreen of at least 30 SPF.  Take over-the-counter and prescription medicines only as told by your doctor.  If you were given antibiotic medicine or ointment, take or apply it as told by your doctor. Do not stop using the antibiotic even if your wound is getting better.  Do not scratch or pick at the wound.  Keep all follow-up visits as told by your doctor. This is important.  Check your wound every day for signs of infection. Watch for:  Redness, swelling, or pain.  Fluid, blood, or pus.  Raise (elevate) the injured area above the level of your heart while you are sitting or lying down, if possible. GET HELP IF:  You got a tetanus shot and you have any of these problems at the injection site:  Swelling.  Very bad pain.  Redness.  Bleeding.  You have a fever.  A wound that was  closed breaks open.  You notice a bad smell coming from your wound or your bandage.  You notice something coming out of the wound, such as wood or glass.  Medicine does not help your pain.  You have more redness, swelling, or pain at the site of your wound.  You have fluid, blood, or pus coming from your wound.  You notice a change in the color of your skin near your wound.  You need to change the bandage often because fluid, blood, or pus is coming from the wound.  You start to have a new rash.  You start to have numbness around the wound. GET HELP RIGHT AWAY IF:  You have very bad swelling around the wound.  Your pain suddenly gets worse and is very bad.  You notice painful lumps near the wound or on skin that is anywhere on your body.  You have a red streak going away from your wound.  The wound is on your hand or foot and you cannot move a finger or toe like you usually can.  The wound is on your hand or foot and you notice that your fingers or toes look pale or bluish.   This information is not intended to replace advice given to you by your health care provider. Make sure you discuss any questions you have with your health care provider.   Document Released: 09/28/2007 Document Revised: 08/26/2014 Document Reviewed: 04/07/2014 Elsevier Interactive Patient Education 2016 [employer]  Nonsutured Laceration Care A laceration is a cut that goes through all layers of the skin and extends into the tissue that is right under the skin. This type of cut is usually stitched up (sutured) or closed with tape (adhesive strips) or skin glue shortly after the injury happens. However, if the wound is dirty or if several hours pass before medical treatment is provided, it is likely that germs (bacteria) will enter the wound. Closing a laceration after bacteria have entered it increases the risk of infection. In these cases, your health care provider may leave the laceration open  (nonsutured) and cover it with a bandage. This type of treatment helps prevent infection and allows the wound to heal from the deepest layer of tissue damage up to the surface. An open fracture is a type of injury that may involve nonsutured lacerations. An open fracture is a break in a bone that happens along with one or more lacerations through the skin that is near the fracture site. HOW  TO CARE FOR YOUR NONSUTURED LACERATION  Take or apply over-the-counter and prescription medicines only as told by your health care provider.  If you were prescribed an antibiotic medicine, take or apply it as told by your health care provider. Do not stop using the antibiotic even if your condition improves.  Clean the wound one time each day or as told by your health care provider.  Wash the wound with mild soap and water.  Rinse the wound with water to remove all soap.  Pat your wound dry with a clean towel. Do not rub the wound.  Do not inject anything into the wound unless your health care provider told you to.  Change any bandages (dressings) as told by your health care provider. This includes changing the dressing if it gets wet, dirty, or starts to smell bad.  Keep the dressing dry until your health care provider says it can be removed. Do not take baths, swim, or do anything that puts your wound underwater until your health care provider approves.  Raise (elevate) the injured area above the level of your heart while you are sitting or lying down, if possible.  Do not scratch or pick at the wound.  Check your wound every day for signs of infection. Watch for:  Redness, swelling, or pain.  Fluid, blood, or pus.  Keep all follow-up visits as told by your health care provider. This is important. SEEK MEDICAL CARE IF:  You received a tetanus and shot and you have swelling, severe pain, redness, or bleeding at the injection site.   You have a fever.  Your pain is not controlled with  medicine.  You have increased redness, swelling, or pain at the site of your wound.  You have fluid, blood, or pus coming from your wound.  You notice a bad smell coming from your wound or your dressing.  You notice something coming out of the wound, such as wood or glass.  You notice a change in the color of your skin near your wound.  You develop a new rash.  You need to change the dressing frequently due to fluid, blood, or pus draining from the wound.  You develop numbness around your wound. SEEK IMMEDIATE MEDICAL CARE IF:  Your pain suddenly increases and is severe.  You develop severe swelling around the wound.  The wound is on your hand or foot and you cannot properly move a finger or toe.  The wound is on your hand or foot and you notice that your fingers or toes look pale or bluish.  You have a red streak going away from your wound.   This information is not intended to replace advice given to you by your health care provider. Make sure you discuss any questions you have with your health care provider.   Document Released: 03/09/2006 Document Revised: 08/26/2014 Document Reviewed: 04/07/2014 Elsevier Interactive Patient Education [employer]

## 2015-11-16 ENCOUNTER — Telehealth: Payer: Self-pay | Admitting: *Deleted

## 2015-11-16 ENCOUNTER — Ambulatory Visit: Payer: Self-pay | Admitting: Neurology

## 2015-11-16 NOTE — Telephone Encounter (Signed)
No showed follow up appointment. 

## 2015-11-17 ENCOUNTER — Encounter: Payer: Self-pay | Admitting: Neurology

## 2015-11-22 ENCOUNTER — Emergency Department (HOSPITAL_COMMUNITY): Payer: Medicaid Other

## 2015-11-22 ENCOUNTER — Encounter (HOSPITAL_COMMUNITY): Payer: Self-pay

## 2015-11-22 ENCOUNTER — Inpatient Hospital Stay (HOSPITAL_COMMUNITY)
Admission: EM | Admit: 2015-11-22 | Discharge: 2015-12-25 | DRG: 870 | Disposition: E | Payer: Medicaid Other | Attending: Pulmonary Disease | Admitting: Pulmonary Disease

## 2015-11-22 DIAGNOSIS — J9690 Respiratory failure, unspecified, unspecified whether with hypoxia or hypercapnia: Secondary | ICD-10-CM

## 2015-11-22 DIAGNOSIS — F17210 Nicotine dependence, cigarettes, uncomplicated: Secondary | ICD-10-CM | POA: Diagnosis present

## 2015-11-22 DIAGNOSIS — N179 Acute kidney failure, unspecified: Secondary | ICD-10-CM | POA: Diagnosis present

## 2015-11-22 DIAGNOSIS — E875 Hyperkalemia: Secondary | ICD-10-CM | POA: Diagnosis not present

## 2015-11-22 DIAGNOSIS — Z01818 Encounter for other preprocedural examination: Secondary | ICD-10-CM

## 2015-11-22 DIAGNOSIS — E873 Alkalosis: Secondary | ICD-10-CM | POA: Diagnosis not present

## 2015-11-22 DIAGNOSIS — E869 Volume depletion, unspecified: Secondary | ICD-10-CM | POA: Diagnosis present

## 2015-11-22 DIAGNOSIS — A419 Sepsis, unspecified organism: Principal | ICD-10-CM | POA: Diagnosis present

## 2015-11-22 DIAGNOSIS — F332 Major depressive disorder, recurrent severe without psychotic features: Secondary | ICD-10-CM | POA: Diagnosis present

## 2015-11-22 DIAGNOSIS — E662 Morbid (severe) obesity with alveolar hypoventilation: Secondary | ICD-10-CM | POA: Diagnosis present

## 2015-11-22 DIAGNOSIS — Y95 Nosocomial condition: Secondary | ICD-10-CM | POA: Diagnosis present

## 2015-11-22 DIAGNOSIS — I110 Hypertensive heart disease with heart failure: Secondary | ICD-10-CM | POA: Diagnosis present

## 2015-11-22 DIAGNOSIS — J9600 Acute respiratory failure, unspecified whether with hypoxia or hypercapnia: Secondary | ICD-10-CM

## 2015-11-22 DIAGNOSIS — Z808 Family history of malignant neoplasm of other organs or systems: Secondary | ICD-10-CM

## 2015-11-22 DIAGNOSIS — Z885 Allergy status to narcotic agent status: Secondary | ICD-10-CM

## 2015-11-22 DIAGNOSIS — K5900 Constipation, unspecified: Secondary | ICD-10-CM | POA: Diagnosis not present

## 2015-11-22 DIAGNOSIS — R0902 Hypoxemia: Secondary | ICD-10-CM

## 2015-11-22 DIAGNOSIS — Z720 Tobacco use: Secondary | ICD-10-CM

## 2015-11-22 DIAGNOSIS — I5033 Acute on chronic diastolic (congestive) heart failure: Secondary | ICD-10-CM | POA: Diagnosis present

## 2015-11-22 DIAGNOSIS — J9621 Acute and chronic respiratory failure with hypoxia: Secondary | ICD-10-CM | POA: Diagnosis present

## 2015-11-22 DIAGNOSIS — F200 Paranoid schizophrenia: Secondary | ICD-10-CM | POA: Diagnosis present

## 2015-11-22 DIAGNOSIS — I959 Hypotension, unspecified: Secondary | ICD-10-CM | POA: Diagnosis not present

## 2015-11-22 DIAGNOSIS — I2781 Cor pulmonale (chronic): Secondary | ICD-10-CM | POA: Diagnosis present

## 2015-11-22 DIAGNOSIS — K529 Noninfective gastroenteritis and colitis, unspecified: Secondary | ICD-10-CM | POA: Diagnosis present

## 2015-11-22 DIAGNOSIS — I10 Essential (primary) hypertension: Secondary | ICD-10-CM | POA: Diagnosis present

## 2015-11-22 DIAGNOSIS — N390 Urinary tract infection, site not specified: Secondary | ICD-10-CM | POA: Diagnosis not present

## 2015-11-22 DIAGNOSIS — Z91030 Bee allergy status: Secondary | ICD-10-CM

## 2015-11-22 DIAGNOSIS — Z880 Allergy status to penicillin: Secondary | ICD-10-CM

## 2015-11-22 DIAGNOSIS — R451 Restlessness and agitation: Secondary | ICD-10-CM | POA: Diagnosis not present

## 2015-11-22 DIAGNOSIS — R7989 Other specified abnormal findings of blood chemistry: Secondary | ICD-10-CM | POA: Diagnosis present

## 2015-11-22 DIAGNOSIS — Z6841 Body Mass Index (BMI) 40.0 and over, adult: Secondary | ICD-10-CM

## 2015-11-22 DIAGNOSIS — Z9119 Patient's noncompliance with other medical treatment and regimen: Secondary | ICD-10-CM

## 2015-11-22 DIAGNOSIS — F17290 Nicotine dependence, other tobacco product, uncomplicated: Secondary | ICD-10-CM | POA: Diagnosis present

## 2015-11-22 DIAGNOSIS — R0603 Acute respiratory distress: Secondary | ICD-10-CM

## 2015-11-22 DIAGNOSIS — Z7984 Long term (current) use of oral hypoglycemic drugs: Secondary | ICD-10-CM

## 2015-11-22 DIAGNOSIS — R918 Other nonspecific abnormal finding of lung field: Secondary | ICD-10-CM

## 2015-11-22 DIAGNOSIS — F4310 Post-traumatic stress disorder, unspecified: Secondary | ICD-10-CM

## 2015-11-22 DIAGNOSIS — J810 Acute pulmonary edema: Secondary | ICD-10-CM

## 2015-11-22 DIAGNOSIS — G47419 Narcolepsy without cataplexy: Secondary | ICD-10-CM | POA: Diagnosis present

## 2015-11-22 DIAGNOSIS — N141 Nephropathy induced by other drugs, medicaments and biological substances: Secondary | ICD-10-CM | POA: Diagnosis present

## 2015-11-22 DIAGNOSIS — E1140 Type 2 diabetes mellitus with diabetic neuropathy, unspecified: Secondary | ICD-10-CM | POA: Diagnosis present

## 2015-11-22 DIAGNOSIS — G9341 Metabolic encephalopathy: Secondary | ICD-10-CM | POA: Diagnosis present

## 2015-11-22 DIAGNOSIS — J189 Pneumonia, unspecified organism: Secondary | ICD-10-CM | POA: Diagnosis present

## 2015-11-22 DIAGNOSIS — F22 Delusional disorders: Secondary | ICD-10-CM

## 2015-11-22 DIAGNOSIS — L0390 Cellulitis, unspecified: Secondary | ICD-10-CM | POA: Diagnosis present

## 2015-11-22 DIAGNOSIS — J9622 Acute and chronic respiratory failure with hypercapnia: Secondary | ICD-10-CM | POA: Diagnosis present

## 2015-11-22 DIAGNOSIS — Z803 Family history of malignant neoplasm of breast: Secondary | ICD-10-CM

## 2015-11-22 DIAGNOSIS — K140 Glossitis: Secondary | ICD-10-CM | POA: Diagnosis present

## 2015-11-22 DIAGNOSIS — L03116 Cellulitis of left lower limb: Secondary | ICD-10-CM | POA: Diagnosis present

## 2015-11-22 DIAGNOSIS — R4182 Altered mental status, unspecified: Secondary | ICD-10-CM | POA: Diagnosis not present

## 2015-11-22 DIAGNOSIS — Z79899 Other long term (current) drug therapy: Secondary | ICD-10-CM

## 2015-11-22 DIAGNOSIS — Z91018 Allergy to other foods: Secondary | ICD-10-CM

## 2015-11-22 DIAGNOSIS — Z9289 Personal history of other medical treatment: Secondary | ICD-10-CM

## 2015-11-22 DIAGNOSIS — T502X5A Adverse effect of carbonic-anhydrase inhibitors, benzothiadiazides and other diuretics, initial encounter: Secondary | ICD-10-CM | POA: Diagnosis present

## 2015-11-22 DIAGNOSIS — R509 Fever, unspecified: Secondary | ICD-10-CM

## 2015-11-22 DIAGNOSIS — J988 Other specified respiratory disorders: Secondary | ICD-10-CM | POA: Diagnosis present

## 2015-11-22 DIAGNOSIS — J45909 Unspecified asthma, uncomplicated: Secondary | ICD-10-CM | POA: Diagnosis present

## 2015-11-22 DIAGNOSIS — E870 Hyperosmolality and hypernatremia: Secondary | ICD-10-CM | POA: Diagnosis not present

## 2015-11-22 DIAGNOSIS — F419 Anxiety disorder, unspecified: Secondary | ICD-10-CM | POA: Diagnosis present

## 2015-11-22 DIAGNOSIS — G4733 Obstructive sleep apnea (adult) (pediatric): Secondary | ICD-10-CM | POA: Diagnosis present

## 2015-11-22 DIAGNOSIS — Z4659 Encounter for fitting and adjustment of other gastrointestinal appliance and device: Secondary | ICD-10-CM

## 2015-11-22 DIAGNOSIS — M79605 Pain in left leg: Secondary | ICD-10-CM | POA: Diagnosis present

## 2015-11-22 DIAGNOSIS — Z0189 Encounter for other specified special examinations: Secondary | ICD-10-CM

## 2015-11-22 DIAGNOSIS — R001 Bradycardia, unspecified: Secondary | ICD-10-CM | POA: Diagnosis present

## 2015-11-22 LAB — CBC WITH DIFFERENTIAL/PLATELET
BASOS PCT: 0 %
Basophils Absolute: 0 10*3/uL (ref 0.0–0.1)
EOS PCT: 0 %
Eosinophils Absolute: 0 10*3/uL (ref 0.0–0.7)
HEMATOCRIT: 51.1 % (ref 39.0–52.0)
HEMOGLOBIN: 17.2 g/dL — AB (ref 13.0–17.0)
LYMPHS PCT: 7 %
Lymphs Abs: 2 10*3/uL (ref 0.7–4.0)
MCH: 29.6 pg (ref 26.0–34.0)
MCHC: 33.7 g/dL (ref 30.0–36.0)
MCV: 88 fL (ref 78.0–100.0)
MONOS PCT: 2 %
Monocytes Absolute: 0.6 10*3/uL (ref 0.1–1.0)
NEUTROS ABS: 25.5 10*3/uL — AB (ref 1.7–7.7)
NEUTROS PCT: 91 %
Platelets: 185 10*3/uL (ref 150–400)
RBC: 5.81 MIL/uL (ref 4.22–5.81)
RDW: 13.9 % (ref 11.5–15.5)
WBC: 28.1 10*3/uL — ABNORMAL HIGH (ref 4.0–10.5)

## 2015-11-22 LAB — BLOOD GAS, ARTERIAL
Acid-Base Excess: 2.6 mmol/L — ABNORMAL HIGH (ref 0.0–2.0)
BICARBONATE: 30.4 meq/L — AB (ref 20.0–24.0)
DRAWN BY: 11249
FIO2: 1
O2 Saturation: 99.3 %
PCO2 ART: 61.4 mmHg — AB (ref 35.0–45.0)
PH ART: 7.315 — AB (ref 7.350–7.450)
Patient temperature: 98.6
TCO2: 26.6 mmol/L (ref 0–100)
pO2, Arterial: 281 mmHg — ABNORMAL HIGH (ref 80.0–100.0)

## 2015-11-22 LAB — I-STAT CG4 LACTIC ACID, ED: Lactic Acid, Venous: 1.48 mmol/L (ref 0.5–1.9)

## 2015-11-22 LAB — COMPREHENSIVE METABOLIC PANEL
ALBUMIN: 3.5 g/dL (ref 3.5–5.0)
ALK PHOS: 78 U/L (ref 38–126)
ALT: 19 U/L (ref 17–63)
AST: 24 U/L (ref 15–41)
Anion gap: 8 (ref 5–15)
BUN: 13 mg/dL (ref 6–20)
CALCIUM: 8.6 mg/dL — AB (ref 8.9–10.3)
CO2: 28 mmol/L (ref 22–32)
CREATININE: 1.4 mg/dL — AB (ref 0.61–1.24)
Chloride: 97 mmol/L — ABNORMAL LOW (ref 101–111)
GFR calc non Af Amer: >60 mL/min (ref >60)
GLUCOSE: 155 mg/dL — AB (ref 65–99)
Potassium: 4.1 mmol/L (ref 3.5–5.1)
SODIUM: 133 mmol/L — AB (ref 135–145)
Total Bilirubin: 0.6 mg/dL (ref 0.3–1.2)
Total Protein: 8.1 g/dL (ref 6.5–8.1)

## 2015-11-22 LAB — URINALYSIS, ROUTINE W REFLEX MICROSCOPIC
Bilirubin Urine: NEGATIVE
GLUCOSE, UA: NEGATIVE mg/dL
Ketones, ur: NEGATIVE mg/dL
LEUKOCYTES UA: NEGATIVE
Nitrite: NEGATIVE
PROTEIN: 30 mg/dL — AB
SPECIFIC GRAVITY, URINE: 1.013 (ref 1.005–1.030)
pH: 6 (ref 5.0–8.0)

## 2015-11-22 LAB — BRAIN NATRIURETIC PEPTIDE: B NATRIURETIC PEPTIDE 5: 190.5 pg/mL — AB (ref 0.0–100.0)

## 2015-11-22 LAB — GLUCOSE, CAPILLARY
GLUCOSE-CAPILLARY: 148 mg/dL — AB (ref 65–99)
GLUCOSE-CAPILLARY: 185 mg/dL — AB (ref 65–99)
GLUCOSE-CAPILLARY: 187 mg/dL — AB (ref 65–99)
Glucose-Capillary: 187 mg/dL — ABNORMAL HIGH (ref 65–99)

## 2015-11-22 LAB — URINE MICROSCOPIC-ADD ON

## 2015-11-22 MED ORDER — METRONIDAZOLE IN NACL 5-0.79 MG/ML-% IV SOLN
500.0000 mg | Freq: Three times a day (TID) | INTRAVENOUS | Status: DC
  Administered 2015-11-22 – 2015-11-23 (×3): 500 mg via INTRAVENOUS
  Filled 2015-11-22 (×3): qty 100

## 2015-11-22 MED ORDER — METOPROLOL TARTRATE 5 MG/5ML IV SOLN: 5.0000 mg | INTRAVENOUS | Status: DC | PRN

## 2015-11-22 MED ORDER — VANCOMYCIN HCL 10 G IV SOLR
1250.0000 mg | Freq: Two times a day (BID) | INTRAVENOUS | Status: DC
  Administered 2015-11-22 – 2015-11-24 (×4): 1250 mg via INTRAVENOUS
  Filled 2015-11-22 (×5): qty 1250

## 2015-11-22 MED ORDER — CETYLPYRIDINIUM CHLORIDE 0.05 % MT LIQD
7.0000 mL | Freq: Two times a day (BID) | OROMUCOSAL | Status: DC
  Administered 2015-11-22 – 2015-11-24 (×5): 7 mL via OROMUCOSAL

## 2015-11-22 MED ORDER — METRONIDAZOLE IN NACL 5-0.79 MG/ML-% IV SOLN
500.0000 mg | Freq: Once | INTRAVENOUS | Status: AC
  Administered 2015-11-22: 500 mg via INTRAVENOUS
  Filled 2015-11-22: qty 100

## 2015-11-22 MED ORDER — DEXTROSE 5 % IV SOLN
2.0000 g | Freq: Three times a day (TID) | INTRAVENOUS | Status: DC
  Administered 2015-11-22 – 2015-11-23 (×3): 2 g via INTRAVENOUS
  Filled 2015-11-22 (×5): qty 2

## 2015-11-22 MED ORDER — SODIUM CHLORIDE 0.9 % IV SOLN
INTRAVENOUS | Status: AC
  Administered 2015-11-22: 06:00:00 via INTRAVENOUS

## 2015-11-22 MED ORDER — ALPRAZOLAM 1 MG PO TABS
1.0000 mg | ORAL_TABLET | Freq: Three times a day (TID) | ORAL | Status: DC | PRN
  Administered 2015-11-22: 1 mg via ORAL
  Filled 2015-11-22: qty 1

## 2015-11-22 MED ORDER — INSULIN ASPART 100 UNIT/ML ~~LOC~~ SOLN
0.0000 [IU] | Freq: Three times a day (TID) | SUBCUTANEOUS | Status: DC
  Administered 2015-11-22: 3 [IU] via SUBCUTANEOUS
  Administered 2015-11-22: 2 [IU] via SUBCUTANEOUS
  Administered 2015-11-22: 3 [IU] via SUBCUTANEOUS
  Administered 2015-11-23: 2 [IU] via SUBCUTANEOUS
  Administered 2015-11-23 (×2): 3 [IU] via SUBCUTANEOUS
  Administered 2015-11-24: 2 [IU] via SUBCUTANEOUS
  Administered 2015-11-24: 3 [IU] via SUBCUTANEOUS
  Administered 2015-11-24: 2 [IU] via SUBCUTANEOUS

## 2015-11-22 MED ORDER — METFORMIN HCL 500 MG PO TABS
500.0000 mg | ORAL_TABLET | Freq: Two times a day (BID) | ORAL | Status: DC
  Administered 2015-11-22 – 2015-11-24 (×4): 500 mg via ORAL
  Filled 2015-11-22 (×7): qty 1

## 2015-11-22 MED ORDER — AZTREONAM 2 G IJ SOLR
2.0000 g | Freq: Once | INTRAMUSCULAR | Status: AC
  Administered 2015-11-22: 2 g via INTRAVENOUS
  Filled 2015-11-22: qty 2

## 2015-11-22 MED ORDER — SODIUM CHLORIDE 0.9 % IV SOLN
1500.0000 mg | Freq: Once | INTRAVENOUS | Status: DC
  Administered 2015-11-22: 1500 mg via INTRAVENOUS
  Filled 2015-11-22: qty 1500

## 2015-11-22 MED ORDER — ACETAMINOPHEN 325 MG PO TABS
650.0000 mg | ORAL_TABLET | Freq: Once | ORAL | Status: AC
  Administered 2015-11-23: 650 mg via ORAL
  Filled 2015-11-22: qty 2

## 2015-11-22 MED ORDER — ONDANSETRON HCL 4 MG/2ML IJ SOLN: 4.0000 mg | Freq: Four times a day (QID) | INTRAMUSCULAR | Status: DC | PRN

## 2015-11-22 MED ORDER — DICYCLOMINE HCL 20 MG PO TABS
20.0000 mg | ORAL_TABLET | Freq: Two times a day (BID) | ORAL | Status: DC | PRN
  Filled 2015-11-22: qty 1

## 2015-11-22 MED ORDER — IPRATROPIUM-ALBUTEROL 0.5-2.5 (3) MG/3ML IN SOLN: 3.0000 mL | RESPIRATORY_TRACT | Status: DC | PRN

## 2015-11-22 MED ORDER — ENOXAPARIN SODIUM 80 MG/0.8ML ~~LOC~~ SOLN
75.0000 mg | SUBCUTANEOUS | Status: DC
  Administered 2015-11-22 – 2015-11-28 (×7): 75 mg via SUBCUTANEOUS
  Filled 2015-11-22 (×8): qty 0.8

## 2015-11-22 MED ORDER — OXYCODONE HCL 5 MG PO TABS
5.0000 mg | ORAL_TABLET | Freq: Four times a day (QID) | ORAL | Status: DC | PRN
  Administered 2015-11-23 – 2015-11-24 (×3): 5 mg via ORAL
  Filled 2015-11-22 (×3): qty 1

## 2015-11-22 MED ORDER — VANCOMYCIN HCL IN DEXTROSE 1-5 GM/200ML-% IV SOLN
1000.0000 mg | Freq: Once | INTRAVENOUS | Status: AC
  Administered 2015-11-22: 1000 mg via INTRAVENOUS
  Filled 2015-11-22: qty 200

## 2015-11-22 MED ORDER — INSULIN ASPART 100 UNIT/ML ~~LOC~~ SOLN: 0.0000 [IU] | Freq: Every day | SUBCUTANEOUS | Status: DC

## 2015-11-22 MED ORDER — GABAPENTIN 300 MG PO CAPS
300.0000 mg | ORAL_CAPSULE | Freq: Two times a day (BID) | ORAL | Status: DC
  Administered 2015-11-22 – 2015-11-24 (×5): 300 mg via ORAL
  Filled 2015-11-22 (×5): qty 1

## 2015-11-22 MED ORDER — LISINOPRIL 2.5 MG PO TABS
5.0000 mg | ORAL_TABLET | Freq: Every day | ORAL | Status: DC
  Administered 2015-11-22 – 2015-11-24 (×3): 5 mg via ORAL
  Filled 2015-11-22 (×2): qty 2

## 2015-11-22 MED ORDER — ALBUTEROL SULFATE (2.5 MG/3ML) 0.083% IN NEBU: 2.5000 mg | INHALATION_SOLUTION | RESPIRATORY_TRACT | Status: DC | PRN

## 2015-11-22 MED ORDER — NICOTINE 14 MG/24HR TD PT24
14.0000 mg | MEDICATED_PATCH | Freq: Every day | TRANSDERMAL | Status: DC
  Administered 2015-11-22 – 2015-11-25 (×4): 14 mg via TRANSDERMAL
  Filled 2015-11-22 (×5): qty 1

## 2015-11-22 MED ORDER — ONDANSETRON HCL 4 MG PO TABS: 4.0000 mg | ORAL_TABLET | Freq: Four times a day (QID) | ORAL | Status: DC | PRN

## 2015-11-22 MED ORDER — PANTOPRAZOLE SODIUM 40 MG PO TBEC
40.0000 mg | DELAYED_RELEASE_TABLET | Freq: Every day | ORAL | Status: DC
  Administered 2015-11-22 – 2015-11-24 (×3): 40 mg via ORAL
  Filled 2015-11-22 (×3): qty 1

## 2015-11-22 NOTE — H&P (Signed)
PCP:   Jacklynn Barnacle, NP   Chief Complaint:  Left leg pain  HPI: This is a 35 year old gentleman with multiple medical issues including diabetes. He states he woke up after midnight tonight and had pain in his left lower extremity. He came to the ER. He denies any recent trauma, stating the scarring on the left lower extremity are old scars.  He reports he's had been having subjective fever, nausea and vomiting, diarrhea for the past few days. No evidence of hematemesis.He does report some generalized abdominal discomfort. No significant shortness of breath. He has a CPAP machine but his been broken, therefore did not use.   Review of Systems:  The patient denies anorexia, fever, weight loss,, vision loss, decreased hearing, hoarseness, chest pain, syncope, dyspnea on exertion, peripheral edema, balance deficits, hemoptysis, abdominal pain, melena, hematochezia, severe indigestion/heartburn, hematuria, incontinence, genital sores, muscle weakness, suspicious skin lesions, transient blindness, difficulty walking, depression, unusual weight change, abnormal bleeding, enlarged lymph nodes, angioedema, and breast masses.  Past Medical History: Past Medical History:  Diagnosis Date  . Asthma   . Bipolar disorder (HCC)   . Diabetes mellitus without complication (HCC)   . Hypertension   . Narcolepsy   . PTSD (post-traumatic stress disorder)   . Sleep apnea   . Sleep apnea   . Sleep apnea   . Swelling of eye, left 04/2015   Past Surgical History:  Procedure Laterality Date  . TONSILLECTOMY      Medications: Prior to Admission medications   Medication Sig Start Date End Date Taking? Authorizing Provider  ALPRAZolam Prudy Feeler) 1 MG tablet Take 1 mg by mouth 3 (three) times daily as needed for anxiety.  02/25/15   Historical Provider, MD  dicyclomine (BENTYL) 20 MG tablet Take 1 tablet (20 mg total) by mouth 2 (two) times daily as needed (for abdominal pain). 06/13/15   Antony Madura, PA-C   furosemide (LASIX) 20 MG tablet Take 1 tablet (20 mg total) by mouth 2 (two) times daily. 03/26/15   Thao P Le, DO  gabapentin (NEURONTIN) 300 MG capsule Take 1 capsule (300 mg total) by mouth 2 (two) times daily. Take 1 at bedtime for 1 week, then increase to twice a day. Patient taking differently: Take 300 mg by mouth 2 (two) times daily.  03/25/15   Emi Belfast, FNP  lisinopril (PRINIVIL,ZESTRIL) 5 MG tablet Take 1 tablet (5 mg total) by mouth daily. 03/26/15   Emi Belfast, FNP  metFORMIN (GLUCOPHAGE) 500 MG tablet TAKE 1 TABLET BY MOUTH TWICE A DAY WITH MEALS Patient taking differently: Take 500 mg by mouth 2 (two) times daily with a meal.  04/24/15   Emi Belfast, FNP  ondansetron (ZOFRAN ODT) 4 MG disintegrating tablet Take 1 tablet (4 mg total) by mouth every 8 (eight) hours as needed for nausea or vomiting. 06/13/15   Antony Madura, PA-C  oxyCODONE (OXY IR/ROXICODONE) 5 MG immediate release tablet Take 1 tablet (5 mg total) by mouth every 6 (six) hours as needed (pain). Patient taking differently: Take 10 mg by mouth every 6 (six) hours as needed (pain).  05/17/15   Gwynn Burly, DO  pantoprazole (PROTONIX) 40 MG tablet Take 1 tablet (40 mg total) by mouth daily. 05/17/15   Gwynn Burly, DO  pirbuterol (MAXAIR) 200 MCG/INH inhaler Inhale 2 puffs into the lungs every 6 (six) hours as needed for wheezing.    Historical Provider, MD    Allergies:   Allergies  Allergen Reactions  . Bee  Venom Anaphylaxis  . Contrast Media [Iodinated Diagnostic Agents] Nausea And Vomiting       . Wasp Venom Anaphylaxis  . Cherry Other (See Comments)    unknown  . Penicillins Other (See Comments)    Childhood allergic reaction - no other information available  . Vicodin [Hydrocodone-Acetaminophen] Diarrhea    Social History:  reports that he has been smoking Cigarettes.  He has a 3.75 pack-year smoking history. He uses smokeless tobacco. He reports that he drinks alcohol. He reports  that he uses drugs, including Marijuana, about 5 times per week.  Family History: Family History  Problem Relation Age of Onset  . Adopted: Yes  . Breast cancer Mother   . Bone cancer Father     Physical Exam: Vitals:   10/27/2015 0430 11/20/2015 0432 11/09/2015 0445 11/15/2015 0500  BP: 160/89 160/89    Pulse: 120 120 118 110  Resp: (!) 33 (!) 30 26 (!) 33  Temp:  99.2 F (37.3 C)    TempSrc:  Axillary    SpO2: (!) 89% 92% 93% (!) 84%  Weight:      Height:        General:  Alert and oriented times three, well developed and nourished, Sleepy male Eyes: PERRLA, pink conjunctiva, no scleral icterus ENT: Moist oral mucosa, neck supple, no thyromegaly Lungs: clear to ascultation, no wheeze, no crackles, no use of accessory muscles Cardiovascular: regular rate and rhythm, no regurgitation, no gallops, no murmurs. No carotid bruits, no JVD Abdomen: soft, positive BS, mild generalized tenderness to palpation, greatest in the left lower quadrant, non-distended, no organomegaly, not an acute abdomen GU: not examined Neuro: CN II - XII grossly intact, sensation intact Musculoskeletal: strength 5/5 all extremities, no clubbing, cyanosis or left lower extremity and warmth and induration with tenderness Skin: no rash, no subcutaneous crepitation, no decubitus Psych: appropriate patient   Labs on Admission:   Recent Labs  11/19/2015 0340  NA 133*  K 4.1  CL 97*  CO2 28  GLUCOSE 155*  BUN 13  CREATININE 1.40*  CALCIUM 8.6*    Recent Labs  11/19/2015 0340  AST 24  ALT 19  ALKPHOS 78  BILITOT 0.6  PROT 8.1  ALBUMIN 3.5   No results for input(s): LIPASE, AMYLASE in the last 72 hours.  Recent Labs  10/26/2015 0340  WBC 28.1*  NEUTROABS 25.5*  HGB 17.2*  HCT 51.1  MCV 88.0  PLT 185   No results for input(s): CKTOTAL, CKMB, CKMBINDEX, TROPONINI in the last 72 hours. Invalid input(s): POCBNP No results for input(s): DDIMER in the last 72 hours. No results for input(s): HGBA1C  in the last 72 hours. No results for input(s): CHOL, HDL, LDLCALC, TRIG, CHOLHDL, LDLDIRECT in the last 72 hours. No results for input(s): TSH, T4TOTAL, T3FREE, THYROIDAB in the last 72 hours.  Invalid input(s): FREET3 No results for input(s): VITAMINB12, FOLATE, FERRITIN, TIBC, IRON, RETICCTPCT in the last 72 hours.  Micro Results: No results found for this or any previous visit (from the past 240 hour(s)).   Radiological Exams on Admission: Dg Chest 2 View  Result Date: 11/05/2015 CLINICAL DATA:  35 year old male with sepsis EXAM: CHEST  2 VIEW COMPARISON:  Chest radiograph dated 06/13/2015 FINDINGS: Two views of the chest do not demonstrate focal consolidation. There is no pleural effusion or pneumothorax. The cardiac silhouette is within normal limits. No acute osseous pathology. IMPRESSION: No active cardiopulmonary disease. Electronically Signed   By: Ceasar Mons.D.  On: 12/20/2015 04:24  Dg Tibia/fibula Left  Result Date: 12-20-15 CLINICAL DATA:  35 year old male with left leg pain EXAM: LEFT TIBIA AND FIBULA - 2 VIEW COMPARISON:  None. FINDINGS: No acute fracture or dislocation. The bones are well mineralized. No significant arthritic changes. There is diffuse subcutaneous stranding and edema. Clinical correlation is recommended to evaluate for cellulitis. No soft tissue gas or radiopaque foreign object. IMPRESSION: No acute osseous pathology. Diffuse soft tissue edema may represent cellulitis. Clinical correlation is recommended. Electronically Signed   By: Elgie Collard M.D.   On: 12-20-15 04:26   Assessment/Plan Present on Admission: . Cellulitis -Admit to MedSurg -Blood cultures 2 ordered -Antibiotics ordered per protocol for patient is allergic to penicillin  Gastroenteritis -Antiemetics and C diff ordered -Monitor  Acute kidney injury -Hold Lasix, normal saline 500 mL -Repeat BMP in a.m.  . OSA (obstructive sleep apnea) -CPAP ordered -Oxygen,  duonebs  . Essential hypertension -Stable, resume home medications -When necessary Lopressor ordered  Tobacco abuse -Nicotine patch, duonebs PRN  . Major depressive disorder, recurrent severe without psychotic features (HCC) -Stable, home medications resumed  . Morbid obesity (HCC) -Aware  . Narcolepsy -Aware  . Type 2 diabetes mellitus with diabetic neuropathy, without long-term current use of insulin (HCC) -ADA diet, sliding-scale insulin ordered   CODE STATUS: Full code   Taro Hidrogo December 20, 2015, 5:09 AM

## 2015-11-22 NOTE — Progress Notes (Signed)
Called to patients room by rapid response nurse due to patients Sp02 level and decrease level of consciousness. ABG obtained and called to nurse,placed patient on Bipap and patients transferred to ICU unit.

## 2015-11-22 NOTE — Sedation Documentation (Signed)
At xray

## 2015-11-22 NOTE — Significant Event (Signed)
Rapid Response Event Note  Overview:  Called to room 1611 at 0640 for decreased responsiveness and labored breathing. Patient on 4L [place] sating in the mid 90's but obviously in mild distress. Patient also very lethargic and difficult to keep awake. Respiratory therapy called to obtain blood gas and subsequently started bipap. Patient then transferred to ICU/SD.     Initial Focused Assessment:   Interventions:  Plan of Care (if not transferred):  Event Summary:   at      at          Max Sane B

## 2015-11-22 NOTE — Progress Notes (Signed)
Pharmacy Antibiotic Note  Kenneth Mcfarland is a 35 y.o. male admitted on 2015-12-09 with cellulitis.  PMH includes DM and pt reports recently cuttling left leg.  In the ED patient was ordered Vancomycin 1gm, Aztreonam 2gm and Metronidazole 500mg  IV x 1 dose each.  Pharmacy has been consulted for Vancomycin, Metronidazole & Aztreonam dosing.   Plan:  Vancomycin 1500mg  IV x 1 ==>Give immediately at the end of 1gm infusion for total loading dose of 2500mg , followed by 1250mg  IV q12h  Vancomycin trough goal for cellulitis: 10-15 mcg/ml  Aztreonam 2gm IV q8h  Metronidazole 500mg  IV q8h  F/U renal function (CrCln ~ 74 ml/min)  F/U cultures and sensitivities  Height: 5\' 9"  (175.3 cm) Weight: (!) 341 lb (154.7 kg) IBW/kg (Calculated) : 70.7  Temp (24hrs), Avg:99.9 F (37.7 C), Min:99.2 F (37.3 C), Max:100.6 F (38.1 C)   Recent Labs Lab 09-Dec-2015 0340 12/09/2015 0349  WBC 28.1*  --   CREATININE 1.40*  --   LATICACIDVEN  --  1.48    Estimated Creatinine Clearance: 107.6 mL/min (by C-G formula based on SCr of 1.4 mg/dL).    Allergies  Allergen Reactions  . Bee Venom Anaphylaxis  . Contrast Media [Iodinated Diagnostic Agents] Nausea And Vomiting       . Wasp Venom Anaphylaxis  . Cherry Other (See Comments)    unknown  . Penicillins Other (See Comments)    Childhood allergic reaction - no other information available  . Vicodin [Hydrocodone-Acetaminophen] Diarrhea    Antimicrobials this admission: 7/30 Vanc >>   7/30 Aztreonam >>   7/30 Flagyl >>  Dose adjustments this admission:    Microbiology results: 7/30 BCx:   7/30 UCx:     Thank you for allowing pharmacy to be a part of this patient's care.  Maryellen Pile 12-09-15 4:49 AM

## 2015-11-22 NOTE — Progress Notes (Signed)
PROGRESS NOTE    Kenneth Mcfarland  ZOX:[id number] DOB: Apr 17, 1981 DOA: 11/24/15 PCP: Jacklynn Barnacle, NP  Outpatient Specialists:   Brief Narrative: 35 year old gentleman with multiple medical issues including diabetes. He states he woke up after midnight tonight and had pain in his left lower extremity. He came to the ER. He denies any recent trauma, stating the scarring on the left lower extremity are old scars.  He reports he's had been having subjective fever, nausea and vomiting, diarrhea for the past few days. No evidence of hematemesis.He does report some generalized abdominal discomfort. No significant shortness of breath. He has a CPAP machine but his been broken, therefore did not use.  Assessment & Plan:   Active Problems:   Major depressive disorder, recurrent severe without psychotic features (HCC)   OSA (obstructive sleep apnea)   Narcolepsy   Essential hypertension   Morbid obesity (HCC)   Type 2 diabetes mellitus with diabetic neuropathy, without long-term current use of insulin (HCC)   Cellulitis   Tobacco abuse   PTSD (post-traumatic stress disorder)   Paranoid schizophrenia (HCC)    Cellulitis - Continue antibiotics - Follow blood cultures.   Gastroenteritis. - No diarrhea since admission. - Follow work up.  Acute kidney injury - Scr is 1.4. - Monitor closely. -Repeat BMP in a.m.  . OSA (obstructive sleep apnea) - On CPAP ordered -Oxygen, duonebs -Low threshold to consult Pulmonology                . Essential hypertension - Optimize.  Tobacco abuse -Nicotine patch, duonebs PRN  . Major depressive disorder, recurrent severe without psychotic features (HCC) -Stable, home medications resumed  . Morbid obesity (HCC)  . Narcolepsy  . Type 2 diabetes mellitus with diabetic neuropathy, without long-term current use of insulin (HCC) - ADA diet, sliding-scale insulin ordered   CODE STATUS: Full code   Subjective: Nil new  complaints.  Objective: Vitals:   24-Nov-2015 0650 11/24/15 0758 24-Nov-2015 0800 2015-11-24 0900  BP:  (!) 158/78 135/69 140/67  Pulse: (!) 112 (!) 123 (!) 117 (!) 115  Resp: (!) 32 (!) 31 (!) 38 (!) 29  Temp:  (!) 100.6 F (38.1 C)    TempSrc:  Oral    SpO2: 100% (!) 88% 90% 93%  Weight:  (!) 142.4 kg (314 lb)    Height:  5\' 9"  (1.753 m)      Intake/Output Summary (Last 24 hours) at 11/24/15 1009 Last data filed at November 24, 2015 1000  Gross per 24 hour  Intake              660 ml  Output              750 ml  Net              -90 ml   Filed Weights   2015/11/24 0257 11-24-2015 0600 11-24-15 0758  Weight: (!) 154.7 kg (341 lb) (!) 154.7 kg (341 lb) (!) 142.4 kg (314 lb)    Examination:  General exam: Morbidly obese. Appears calm and comfortable.  Respiratory system: Decreased air entry globally.  Cardiovascular system: S1 & S2 heard. Gastrointestinal system: Abdomen is obese, nondistended, soft and nontender.   Central nervous system: Alert and oriented. Moves all limbs.  Extremities: Cellulitis of Left lower extremities.  Data Reviewed: I have personally reviewed following labs and imaging studies  CBC:  Recent Labs Lab 11-24-2015 0340  WBC 28.1*  NEUTROABS 25.5*  HGB 17.2*  HCT 51.1  MCV 88.0  PLT 185   Basic Metabolic Panel:  Recent Labs Lab 11/12/2015 0340  NA 133*  K 4.1  CL 97*  CO2 28  GLUCOSE 155*  BUN 13  CREATININE 1.40*  CALCIUM 8.6*   GFR: Estimated Creatinine Clearance: 102.6 mL/min (by C-G formula based on SCr of 1.4 mg/dL). Liver Function Tests:  Recent Labs Lab 11/13/2015 0340  AST 24  ALT 19  ALKPHOS 78  BILITOT 0.6  PROT 8.1  ALBUMIN 3.5   No results for input(s): LIPASE, AMYLASE in the last 168 hours. No results for input(s): AMMONIA in the last 168 hours. Coagulation Profile: No results for input(s): INR, PROTIME in the last 168 hours. Cardiac Enzymes: No results for input(s): CKTOTAL, CKMB, CKMBINDEX, TROPONINI in the last 168  hours. BNP (last 3 results) No results for input(s): PROBNP in the last 8760 hours. HbA1C: No results for input(s): HGBA1C in the last 72 hours. CBG:  Recent Labs Lab 11/16/2015 0804  GLUCAP 148*   Lipid Profile: No results for input(s): CHOL, HDL, LDLCALC, TRIG, CHOLHDL, LDLDIRECT in the last 72 hours. Thyroid Function Tests: No results for input(s): TSH, T4TOTAL, FREET4, T3FREE, THYROIDAB in the last 72 hours. Anemia Panel: No results for input(s): VITAMINB12, FOLATE, FERRITIN, TIBC, IRON, RETICCTPCT in the last 72 hours. Urine analysis:    Component Value Date/Time   COLORURINE YELLOW 11/11/2015 0330   APPEARANCEUR CLEAR 11/04/2015 0330   LABSPEC 1.013 10/27/2015 0330   PHURINE 6.0 11/23/2015 0330   GLUCOSEU NEGATIVE 11/06/2015 0330   HGBUR TRACE (A) 10/29/2015 0330   BILIRUBINUR NEGATIVE 11/17/2015 0330   KETONESUR NEGATIVE 11/10/2015 0330   PROTEINUR 30 (A) 10/24/2015 0330   UROBILINOGEN 1.0 05/03/2014 1740   NITRITE NEGATIVE 10/25/2015 0330   LEUKOCYTESUR NEGATIVE 11/14/2015 0330   Sepsis Labs: @LABRCNTIP (procalcitonin:4,lacticidven:4)  )No results found for this or any previous visit (from the past 240 hour(s)).       Radiology Studies: Dg Chest 2 View  Result Date: 11/14/2015 CLINICAL DATA:  35 year old male with sepsis EXAM: CHEST  2 VIEW COMPARISON:  Chest radiograph dated 06/13/2015 FINDINGS: Two views of the chest do not demonstrate focal consolidation. There is no pleural effusion or pneumothorax. The cardiac silhouette is within normal limits. No acute osseous pathology. IMPRESSION: No active cardiopulmonary disease. Electronically Signed   By: Elgie Collard M.D.   On: 11/12/2015 04:24  Dg Tibia/fibula Left  Result Date: 10/27/2015 CLINICAL DATA:  35 year old male with left leg pain EXAM: LEFT TIBIA AND FIBULA - 2 VIEW COMPARISON:  None. FINDINGS: No acute fracture or dislocation. The bones are well mineralized. No significant arthritic changes. There  is diffuse subcutaneous stranding and edema. Clinical correlation is recommended to evaluate for cellulitis. No soft tissue gas or radiopaque foreign object. IMPRESSION: No acute osseous pathology. Diffuse soft tissue edema may represent cellulitis. Clinical correlation is recommended. Electronically Signed   By: Elgie Collard M.D.   On: 11/03/2015 04:26       Scheduled Meds: . aztreonam  2 g Intravenous Q8H  . enoxaparin (LOVENOX) injection  75 mg Subcutaneous Q24H  . gabapentin  300 mg Oral BID  . insulin aspart  0-15 Units Subcutaneous TID WC  . insulin aspart  0-5 Units Subcutaneous QHS  . lisinopril  5 mg Oral Daily  . metFORMIN  500 mg Oral BID WC  . metronidazole  500 mg Intravenous Q8H  . nicotine  14 mg Transdermal Daily  . pantoprazole  40 mg Oral Daily  . vancomycin  1,250  mg Intravenous Q12H   Continuous Infusions: . sodium chloride 100 mL/hr at 11/17/2015 0616     LOS: 0 days    Time spent: 30 Minutes.    Berton Mount, MD  [facility] Pager #: [phone] 7PM-7AM contact night coverage as above

## 2015-11-22 NOTE — Sedation Documentation (Signed)
MD at bedside. 

## 2015-11-22 NOTE — ED Triage Notes (Addendum)
Pt complaining of waking up with L leg pain radiating from L hip to foot. Pt found by PT[facility] asleep sitting on the side of his bed with a lit cigar in hand. Pt has hx of cellulitis, OSA, HTN, DM. Pt asleep upon arrival with EMS. Pt states that his CPAP has been broken for 2 weeks. Ambulatory. Denies injury or trauma. A&Ox4. Pt took 2 tramadol around 0100

## 2015-11-22 NOTE — Progress Notes (Signed)
Patient removed his BIPAP mask and refused to have it but back on. Oxygen saturation dropped to 82% when he removed his mask, patient was put on 4L oxygen via nasal cannular and his sats came up to 92%. Patient still looks drowsy, but he is oriented x 4.

## 2015-11-22 NOTE — ED Provider Notes (Signed)
[facility]-EMERGENCY DEPT Provider Note  By signing my name below, I, Mesha Guinyard, attest that this documentation has been prepared under the direction and in the presence of Treatment Team:  Attending Provider: Gilda Crease, MD.  Electronically Signed: Arvilla Market, Medical Scribe. 12-03-2015. 3:22 AM.   CSN: [id number] Arrival date & time: 2015-12-03  0254  First Provider Contact:  First MD Initiated Contact with Patient 2015/12/03 0321      History   Chief Complaint Chief Complaint  Patient presents with  . Leg Pain  . Cellulitis    HPI Comments: Kenneth Mcfarland is a 35 y.o. male with a PMHx of DM and sleep apnea who presents to the Emergency Department complaining of left leg edema onset this morning. Pt mentions the cut on his leg occured a couple of days ago. Pt states he uses a CPAP machine but it is currently broken. He states that he is has seasonal allergies that's causing nasal congestion. Pt does smoke.  The history is provided by the patient. No language interpreter was used.  Leg Pain    Past Medical History:  Diagnosis Date  . Asthma   . Bipolar disorder (HCC)   . Diabetes mellitus without complication (HCC)   . Hypertension   . Narcolepsy   . PTSD (post-traumatic stress disorder)   . Sleep apnea   . Sleep apnea   . Sleep apnea   . Swelling of eye, left 04/2015    Patient Active Problem List   Diagnosis Date Noted  . OSA and COPD overlap syndrome (HCC) 05/19/2015  . Decreased vision   . Eye swelling   . Orbital cellulitis on left 05/13/2015  . Obesity hypoventilation syndrome (HCC) 03/02/2015  . Type 2 diabetes mellitus with diabetic neuropathy, without long-term current use of insulin (HCC) 03/02/2015  . Morbid obesity due to excess calories (HCC) 03/02/2015  . Nocturia more than twice per night 03/02/2015  . Sleep disorder, shift work 03/02/2015  . OSA (obstructive sleep apnea) 02/25/2015  . Narcolepsy 02/25/2015  . Essential hypertension  02/25/2015  . Morbid obesity (HCC) 02/25/2015  . Type 2 diabetes mellitus with diabetic polyneuropathy, without long-term current use of insulin (HCC) 02/25/2015  . Major depressive disorder, recurrent severe without psychotic features (HCC) 08/12/2014  . Homicidal ideation   . Suicidal ideations     Past Surgical History:  Procedure Laterality Date  . TONSILLECTOMY         Home Medications    Prior to Admission medications   Medication Sig Start Date End Date Taking? Authorizing Provider  ALPRAZolam Prudy Feeler) 1 MG tablet Take 1 mg by mouth 3 (three) times daily as needed for anxiety.  02/25/15   Historical Provider, MD  dicyclomine (BENTYL) 20 MG tablet Take 1 tablet (20 mg total) by mouth 2 (two) times daily as needed (for abdominal pain). 06/13/15   Antony Madura, PA-C  furosemide (LASIX) 20 MG tablet Take 1 tablet (20 mg total) by mouth 2 (two) times daily. 03/26/15   Thao P Le, DO  gabapentin (NEURONTIN) 300 MG capsule Take 1 capsule (300 mg total) by mouth 2 (two) times daily. Take 1 at bedtime for 1 week, then increase to twice a day. Patient taking differently: Take 300 mg by mouth 2 (two) times daily.  03/25/15   Emi Belfast, FNP  lisinopril (PRINIVIL,ZESTRIL) 5 MG tablet Take 1 tablet (5 mg total) by mouth daily. 03/26/15   Emi Belfast, FNP  metFORMIN (GLUCOPHAGE) 500 MG tablet TAKE 1  TABLET BY MOUTH TWICE A DAY WITH MEALS Patient taking differently: Take 500 mg by mouth 2 (two) times daily with a meal.  04/24/15   Emi Belfast, FNP  ondansetron (ZOFRAN ODT) 4 MG disintegrating tablet Take 1 tablet (4 mg total) by mouth every 8 (eight) hours as needed for nausea or vomiting. 06/13/15   Antony Madura, PA-C  oxyCODONE (OXY IR/ROXICODONE) 5 MG immediate release tablet Take 1 tablet (5 mg total) by mouth every 6 (six) hours as needed (pain). Patient taking differently: Take 10 mg by mouth every 6 (six) hours as needed (pain).  05/17/15   Gwynn Burly, DO  pantoprazole  (PROTONIX) 40 MG tablet Take 1 tablet (40 mg total) by mouth daily. 05/17/15   Gwynn Burly, DO  pirbuterol (MAXAIR) 200 MCG/INH inhaler Inhale 2 puffs into the lungs every 6 (six) hours as needed for wheezing.    Historical Provider, MD    Family History Family History  Problem Relation Age of Onset  . Adopted: Yes  . Breast cancer Mother   . Bone cancer Father     Social History Social History  Substance Use Topics  . Smoking status: Current Every Day Smoker    Packs/day: 0.75    Years: 5.00    Types: Cigarettes    Last attempt to quit: 04/13/2015  . Smokeless tobacco: Current User     Comment: uses vapor  . Alcohol use 0.0 oz/week     Comment: socially     Allergies   Bee venom; Contrast media [iodinated diagnostic agents]; Wasp venom; Cherry; Penicillins; and Vicodin [hydrocodone-acetaminophen]   Review of Systems Review of Systems  Cardiovascular: Positive for leg swelling.  Skin: Positive for wound.  Allergic/Immunologic: Positive for environmental allergies.   Physical Exam Updated Vital Signs BP 160/89 (BP Location: Right Arm)   Pulse 120   Temp 99.2 F (37.3 C) (Axillary)   Resp (!) 30   Ht  (1.753 m)   Wt (!) 341 lb (154.7 kg)   SpO2 92%   BMI 50.36 kg/m   Physical Exam  Constitutional: He is oriented to person, place, and time. He appears well-developed and well-nourished. No distress.  HENT:  Head: Normocephalic and atraumatic.  Right Ear: Hearing normal.  Left Ear: Hearing normal.  Nose: Nose normal.  Mouth/Throat: Oropharynx is clear and moist and mucous membranes are normal.  Eyes: Conjunctivae and EOM are normal. Pupils are equal, round, and reactive to light.  Neck: Normal range of motion. Neck supple.  Cardiovascular: Regular rhythm, S1 normal and S2 normal.  Exam reveals no gallop and no friction rub.   No murmur heard. Pulmonary/Chest: Effort normal and breath sounds normal. No respiratory distress. He exhibits no tenderness.    Abdominal: Soft. Normal appearance and bowel sounds are normal. There is no hepatosplenomegaly. There is no tenderness. There is no rebound, no guarding, no tenderness at McBurney's point and negative Murphy's sign. No hernia.  Musculoskeletal: Normal range of motion.  Neurological: He is alert and oriented to person, place, and time. He has normal strength. No cranial nerve deficit or sensory deficit. Coordination normal. GCS eye subscore is 4. GCS verbal subscore is 5. GCS motor subscore is 6.  Skin: Skin is warm and dry. Abrasion noted. No rash noted. No cyanosis.  Deep abrasion on the distal lower left leg with surrounding erythema  Psychiatric: He has a normal mood and affect. His speech is normal and behavior is normal. Thought content normal.  Nursing note and  vitals reviewed.  ED Treatments / Results  Labs (all labs ordered are listed, but only abnormal results are displayed) Labs Reviewed  COMPREHENSIVE METABOLIC PANEL - Abnormal; Notable for the following:       Result Value   Sodium 133 (*)    Chloride 97 (*)    Glucose, Bld 155 (*)    Creatinine, Ser 1.40 (*)    Calcium 8.6 (*)    All other components within normal limits  CBC WITH DIFFERENTIAL/PLATELET - Abnormal; Notable for the following:    WBC 28.1 (*)    Hemoglobin 17.2 (*)    Neutro Abs 25.5 (*)    All other components within normal limits  CULTURE, BLOOD (ROUTINE X 2)  CULTURE, BLOOD (ROUTINE X 2)  URINE CULTURE  URINALYSIS, ROUTINE W REFLEX MICROSCOPIC (NOT AT [facility])  I-STAT CG4 LACTIC ACID, ED    DIAGNOSTIC STUDIES: Oxygen Saturation is 94% on RA, nl by my interpretation.    COORDINATION OF CARE: 4:34 AM Discussed treatment plan with pt at bedside and pt agreed to plan.   EKG  EKG Interpretation  Date/Time:  Sunday Dec 09, 2015 03:48:56 EDT Ventricular Rate:  122 PR Interval:    QRS Duration: 66 QT Interval:  308 QTC Calculation: 439 R Axis:   80 Text Interpretation:  Sinus tachycardia  Ventricular premature complex Consider right atrial enlargement No previous tracing Confirmed by Blinda Leatherwood  MD, Evoleth Nordmeyer [phone]) on Dec 09, 2015 4:03:32 AM       Radiology Dg Chest 2 View  Result Date: 2015-12-09 CLINICAL DATA:  35 year old male with sepsis EXAM: CHEST  2 VIEW COMPARISON:  Chest radiograph dated 06/13/2015 FINDINGS: Two views of the chest do not demonstrate focal consolidation. There is no pleural effusion or pneumothorax. The cardiac silhouette is within normal limits. No acute osseous pathology. IMPRESSION: No active cardiopulmonary disease. Electronically Signed   By: Elgie Collard M.D.   On: 12-09-15 04:24  Dg Tibia/fibula Left  Result Date: 12/09/2015 CLINICAL DATA:  35 year old male with left leg pain EXAM: LEFT TIBIA AND FIBULA - 2 VIEW COMPARISON:  None. FINDINGS: No acute fracture or dislocation. The bones are well mineralized. No significant arthritic changes. There is diffuse subcutaneous stranding and edema. Clinical correlation is recommended to evaluate for cellulitis. No soft tissue gas or radiopaque foreign object. IMPRESSION: No acute osseous pathology. Diffuse soft tissue edema may represent cellulitis. Clinical correlation is recommended. Electronically Signed   By: Elgie Collard M.D.   On: December 09, 2015 04:26   Procedures Procedures (including critical care time)  Medications Ordered in ED Medications  aztreonam (AZACTAM) 2 g in dextrose 5 % 50 mL IVPB (2 g Intravenous New Bag/Given 12-09-2015 0412)  metroNIDAZOLE (FLAGYL) IVPB 500 mg (not administered)  vancomycin (VANCOCIN) IVPB 1000 mg/200 mL premix (1,000 mg Intravenous New Bag/Given 2015-12-09 0424)     Initial Impression / Assessment and Plan / ED Course  I have reviewed the triage vital signs and the nursing notes.  Pertinent labs & imaging results that were available during my care of the patient were reviewed by me and considered in my medical decision making (see chart for details).  Clinical  Course  Patient presents with pain and swelling of his left leg. Patient reports that he cut his lower leg on something approximately one day ago. He does have an abrasion on the lower leg and there is significant erythema, warmth surrounding the region consistent with cellulitis. He reports that the erythema was not there when he went to bed,  awakened with pain in the above findings. Patient has significant leukocytosis. He also has a low-grade fever. He is not significantly tachycardic. He is not hypotensive and his lactate was 1.48. He does not, therefore, be criteria for severe sepsis and does not require aggressive fluid resuscitation. He was initiated on broad-spectrum antibiotic coverage and will require hospitalization for further management.  Final Clinical Impressions(s) / ED Diagnoses   Final diagnoses:  Left leg cellulitis    New Prescriptions New Prescriptions   No medications on file    I personally performed the services described in this documentation, which was scribed in my presence. The recorded information has been reviewed and is accurate.     Gilda Crease, MD 09-Dec-2015 [phone]

## 2015-11-23 ENCOUNTER — Inpatient Hospital Stay (HOSPITAL_COMMUNITY): Payer: Medicaid Other

## 2015-11-23 DIAGNOSIS — M79609 Pain in unspecified limb: Secondary | ICD-10-CM

## 2015-11-23 DIAGNOSIS — I10 Essential (primary) hypertension: Secondary | ICD-10-CM

## 2015-11-23 DIAGNOSIS — R000 Tachycardia, unspecified: Secondary | ICD-10-CM

## 2015-11-23 DIAGNOSIS — A419 Sepsis, unspecified organism: Principal | ICD-10-CM

## 2015-11-23 DIAGNOSIS — G47419 Narcolepsy without cataplexy: Secondary | ICD-10-CM

## 2015-11-23 DIAGNOSIS — N179 Acute kidney failure, unspecified: Secondary | ICD-10-CM

## 2015-11-23 LAB — RENAL FUNCTION PANEL
Albumin: 2.9 g/dL — ABNORMAL LOW (ref 3.5–5.0)
Anion gap: 5 (ref 5–15)
BUN: 15 mg/dL (ref 6–20)
CO2: 29 mmol/L (ref 22–32)
Calcium: 8.6 mg/dL — ABNORMAL LOW (ref 8.9–10.3)
Chloride: 101 mmol/L (ref 101–111)
Creatinine, Ser: 1.21 mg/dL (ref 0.61–1.24)
GFR calc Af Amer: >60 mL/min (ref >60)
GFR calc non Af Amer: >60 mL/min (ref >60)
Glucose, Bld: 129 mg/dL — ABNORMAL HIGH (ref 65–99)
Phosphorus: 3.4 mg/dL (ref 2.5–4.6)
Potassium: 3.9 mmol/L (ref 3.5–5.1)
Sodium: 135 mmol/L (ref 135–145)

## 2015-11-23 LAB — CBC WITH DIFFERENTIAL/PLATELET
Basophils Absolute: 0 10*3/uL (ref 0.0–0.1)
Basophils Relative: 0 %
Eosinophils Absolute: 0 10*3/uL (ref 0.0–0.7)
Eosinophils Relative: 0 %
HCT: 46.6 % (ref 39.0–52.0)
Hemoglobin: 14.7 g/dL (ref 13.0–17.0)
Lymphocytes Relative: 6 %
Lymphs Abs: 1.3 10*3/uL (ref 0.7–4.0)
MCH: 28.7 pg (ref 26.0–34.0)
MCHC: 31.5 g/dL (ref 30.0–36.0)
MCV: 90.8 fL (ref 78.0–100.0)
Monocytes Absolute: 1 10*3/uL (ref 0.1–1.0)
Monocytes Relative: 4 %
Neutro Abs: 21.5 10*3/uL — ABNORMAL HIGH (ref 1.7–7.7)
Neutrophils Relative %: 90 %
Platelets: 178 10*3/uL (ref 150–400)
RBC: 5.13 MIL/uL (ref 4.22–5.81)
RDW: 14.1 % (ref 11.5–15.5)
WBC: 23.9 10*3/uL — ABNORMAL HIGH (ref 4.0–10.5)

## 2015-11-23 LAB — BASIC METABOLIC PANEL
Anion gap: 5 (ref 5–15)
BUN: 14 mg/dL (ref 6–20)
CHLORIDE: 100 mmol/L — AB (ref 101–111)
CO2: 30 mmol/L (ref 22–32)
CREATININE: 1.2 mg/dL (ref 0.61–1.24)
Calcium: 8.6 mg/dL — ABNORMAL LOW (ref 8.9–10.3)
GFR calc Af Amer: >60 mL/min (ref >60)
GFR calc non Af Amer: >60 mL/min (ref >60)
GLUCOSE: 128 mg/dL — AB (ref 65–99)
Potassium: 3.9 mmol/L (ref 3.5–5.1)
SODIUM: 135 mmol/L (ref 135–145)

## 2015-11-23 LAB — URINE CULTURE

## 2015-11-23 LAB — MRSA PCR SCREENING: MRSA by PCR: NEGATIVE

## 2015-11-23 LAB — GLUCOSE, CAPILLARY
GLUCOSE-CAPILLARY: 132 mg/dL — AB (ref 65–99)
Glucose-Capillary: 155 mg/dL — ABNORMAL HIGH (ref 65–99)
Glucose-Capillary: 171 mg/dL — ABNORMAL HIGH (ref 65–99)
Glucose-Capillary: 138 mg/dL — ABNORMAL HIGH (ref 65–99)

## 2015-11-23 MED ORDER — GUAIFENESIN 100 MG/5ML PO SOLN
5.0000 mL | ORAL | Status: DC | PRN
  Administered 2015-11-23 – 2015-11-24 (×2): 100 mg via ORAL
  Filled 2015-11-23 (×2): qty 10

## 2015-11-23 MED ORDER — DEXTROSE 5 % IV SOLN
1.0000 g | INTRAVENOUS | Status: DC
  Administered 2015-11-23 – 2015-11-24 (×2): 1 g via INTRAVENOUS
  Filled 2015-11-23 (×2): qty 10

## 2015-11-23 NOTE — Progress Notes (Signed)
Pt non-compliant on O2 mask. Pt originally on venturi mask at 14L, sats around 85-92% when awake, when asleep, pt will desat to the 70's. Pt wakes up on command, but easily falls asleep. Pt placed on non-rebreather to help oxygenate. Pt at times will pull mask off to cough, eat, drink. Pt unwilling to use BiPAP during daytime, states "I never use it at home and do not need it now. " MD already aware of pt's request to not use BiPAP during the day.

## 2015-11-23 NOTE — Progress Notes (Signed)
Per RN - Pt taken off bipap by MD at 0745.

## 2015-11-23 NOTE — Progress Notes (Signed)
[facility] PROGRESS NOTE    Progress Note  Kenneth Mcfarland  HLK:[id number] DOB: 1980/08/27 DOA: 11/18/2015 PCP: Jacklynn Barnacle, NP     Brief Narrative:   Kenneth Mcfarland is an 35 y.o. male multiple medical problems including diabetes, narcolepsy, obstructive sleep apnea in the middle of the night with left lower actually pain he denies any recent trauma.  Assessment/Plan:   Sepsis due to Left lower ext Cellulitis Started empically On IV vancomycin and Flagyl and aztreonam. We'll DC Flagyl and aztreonam start empiric Rocephin has no known allergy to penicillin as a child, there is less than 5% cross reactivity between Rocephin and penicillins. Blood cultures are pending His leukocytosis is slowly improving, will get a lower extremity ultrasound to rule out a DVT. Slowly improving. Try keep his leg elevated above his heart level.  Mild tachycardia: Lower extremity Doppler and VQ scan are pending to rule out a PE.  Acute kidney injury: Baseline creatinine around 1.0. On admission 11.4 has improved with IV hydration to 1.2. Obstructive sleep apnea/obesity hypoventilation syndrome: Continues C-pap.  Major depressive disorder, recurrent severe without psychotic features (HCC) Continue current home medications.  Narcolepsy Continue home regimen.  Essential hypertension Started on lisinopril blood pressure is improved  Morbid obesity (HCC) Counseling.  Type 2 diabetes mellitus with diabetic neuropathy, without long-term current use of insulin (HCC) Metformin was resumed continue sliding scale insulin.  Glucose well controlled A1c is pending.  Tobacco abuse Counseling.  PTSD (post-traumatic stress disorder)/ Paranoid schizophrenia (HCC) Stable.   DVT prophylaxis: lovenox Family Communication:none Disposition Plan/Barrier to D/C: unable to determine Code Status:     Code Status Orders        Start     Ordered   11/04/2015 0556  Full code  Continuous     11/12/2015 0556    Code Status History    Date Active Date Inactive Code Status Order ID Comments User Context   05/13/2015 12:20 PM 05/17/2015  4:53 PM Full Code [id number]  Onnie Boer, MD ED   08/11/2014  6:37 PM 08/12/2014  4:40 PM Full Code [id number]  Everlene Farrier, PA-C ED        IV Access:    Peripheral IV   Procedures and diagnostic studies:   Dg Chest 2 View  Result Date: 11/21/2015 CLINICAL DATA:  35 year old male with sepsis EXAM: CHEST  2 VIEW COMPARISON:  Chest radiograph dated 06/13/2015 FINDINGS: Two views of the chest do not demonstrate focal consolidation. There is no pleural effusion or pneumothorax. The cardiac silhouette is within normal limits. No acute osseous pathology. IMPRESSION: No active cardiopulmonary disease. Electronically Signed   By: Elgie Collard M.D.   On: 11/15/2015 04:24  Dg Tibia/fibula Left  Result Date: 11/21/2015 CLINICAL DATA:  35 year old male with left leg pain EXAM: LEFT TIBIA AND FIBULA - 2 VIEW COMPARISON:  None. FINDINGS: No acute fracture or dislocation. The bones are well mineralized. No significant arthritic changes. There is diffuse subcutaneous stranding and edema. Clinical correlation is recommended to evaluate for cellulitis. No soft tissue gas or radiopaque foreign object. IMPRESSION: No acute osseous pathology. Diffuse soft tissue edema may represent cellulitis. Clinical correlation is recommended. Electronically Signed   By: Elgie Collard M.D.   On: 10/24/2015 04:26    Medical Consultants:    None.  Anti-Infectives:   Vancomycin on Rocephin.  Subjective:    Kenneth Mcfarland he relates he continues to have left lower actually pain, he relates his breathing is unchanged from the past 2  weeks. He does not uses c-pap during the day.  Objective:    Vitals:   11/23/15 0500 11/23/15 0600 11/23/15 0629 11/23/15 0700  BP: (!) 147/66 (!) 119/36  129/61  Pulse: (!) 115 (!) 105  (!) 108  Resp: (!) 27 (!) 26  (!) 23   Temp:      TempSrc:      SpO2: (!) 87% 96%  99%  Weight:   (!) 147 kg (324 lb)   Height:        Intake/Output Summary (Last 24 hours) at 11/23/15 0735 Last data filed at 11/23/15 0401  Gross per 24 hour  Intake             2360 ml  Output             2500 ml  Net             -140 ml   Filed Weights   11/08/2015 0600 11/07/2015 0758 11/23/15 0629  Weight: (!) 154.7 kg (341 lb) (!) 142.4 kg (314 lb) (!) 147 kg (324 lb)    Exam: General exam: In no acute distress. Respiratory system: Good air movement and clear to auscultation. Cardiovascular system: S1 & S2 heard, RRR.  Gastrointestinal system: Abdomen is nondistended, soft and nontender.  Central nervous system: Alert and oriented. No focal neurological deficits. Extremities: No pedal edema. Skin: Multiple mild blisters on his abdominal wall, his left lower extremity extremely swollen hematemesis went to touch and tender to palpation. Psychiatry: Judgement and insight appear normal. Mood & affect appropriate.    Data Reviewed:    Labs: Basic Metabolic Panel:  Recent Labs Lab 11/16/2015 0340 11/23/15 0353  NA 133* 135  135  K 4.1 3.9  3.9  CL 97* 101  100*  CO2 GLUCOSE 155* 129*  128*  BUN CREATININE 1.40* 1.21  1.20  CALCIUM 8.6* 8.6*  8.6*  PHOS  --  3.4   GFR Estimated Creatinine Clearance: 121.8 mL/min (by C-G formula based on SCr of 1.2 mg/dL). Liver Function Tests:  Recent Labs Lab 10/30/2015 0340 11/23/15 0353  AST 24  --   ALT 19  --   ALKPHOS 78  --   BILITOT 0.6  --   PROT 8.1  --   ALBUMIN 3.5 2.9*   No results for input(s): LIPASE, AMYLASE in the last 168 hours. No results for input(s): AMMONIA in the last 168 hours. Coagulation profile No results for input(s): INR, PROTIME in the last 168 hours.  CBC:  Recent Labs Lab 11/13/2015 0340 11/23/15 0353  WBC 28.1* 23.9*  NEUTROABS 25.5* 21.5*  HGB 17.2* 14.7  HCT 51.1 46.6  MCV 88.0 90.8  PLT 185 178    Cardiac Enzymes: No results for input(s): CKTOTAL, CKMB, CKMBINDEX, TROPONINI in the last 168 hours. BNP (last 3 results) No results for input(s): PROBNP in the last 8760 hours. CBG:  Recent Labs Lab 11/09/2015 0804 11/05/2015 1135 11/05/2015 1643 11/19/2015 2136  GLUCAP 148* 187* 185* 187*   D-Dimer: No results for input(s): DDIMER in the last 72 hours. Hgb A1c: No results for input(s): HGBA1C in the last 72 hours. Lipid Profile: No results for input(s): CHOL, HDL, LDLCALC, TRIG, CHOLHDL, LDLDIRECT in the last 72 hours. Thyroid function studies: No results for input(s): TSH, T4TOTAL, T3FREE, THYROIDAB in the last 72 hours.  Invalid input(s): FREET3 Anemia work up: No results for input(s): VITAMINB12, FOLATE, FERRITIN, TIBC, IRON, RETICCTPCT  in the last 72 hours. Sepsis Labs:  Recent Labs Lab 18-Dec-2015 0340 Dec 18, 2015 0349 11/23/15 0353  WBC 28.1*  --  23.9*  LATICACIDVEN  --  1.48  --    Microbiology Recent Results (from the past 240 hour(s))  MRSA PCR Screening     Status: None   Collection Time: 2015-12-18 10:36 PM  Result Value Ref Range Status   MRSA by PCR NEGATIVE NEGATIVE Final    Comment:        The GeneXpert MRSA Assay (FDA approved for NASAL specimens only), is one component of a comprehensive MRSA colonization surveillance program. It is not intended to diagnose MRSA infection nor to guide or monitor treatment for MRSA infections.      Medications:   . antiseptic oral rinse  7 mL Mouth Rinse BID  . aztreonam  2 g Intravenous Q8H  . enoxaparin (LOVENOX) injection  75 mg Subcutaneous Q24H  . gabapentin  300 mg Oral BID  . insulin aspart  0-15 Units Subcutaneous TID WC  . insulin aspart  0-5 Units Subcutaneous QHS  . lisinopril  5 mg Oral Daily  . metFORMIN  500 mg Oral BID WC  . metronidazole  500 mg Intravenous Q8H  . nicotine  14 mg Transdermal Daily  . pantoprazole  40 mg Oral Daily  . vancomycin  1,250 mg Intravenous Q12H   Continuous  Infusions:   Time spent: 25 min   LOS: 1 day   Marinda Elk  [facility] Pager [phone]  *Please refer to [email or web address], password [email or web address] to get updated schedule on who will round on this patient, as hospitalists switch teams weekly. If 7PM-7AM, please contact night-coverage at [email or web address], password [email or web address] for any overnight needs.  11/23/2015, 7:35 AM

## 2015-11-23 NOTE — Progress Notes (Signed)
*  Preliminary Results* Left lower extremity venous duplex completed. Study was very technically limited due to patient body habitus, depth of vessels, and patient's upright position.  There is no obvious evidence of acute deep vein thrombosis involving the visualized veins of the left lower extremity. There is no evidence of left Baker's cyst.  Incidental finding: There is evidence of a heterogenous area of the left groin measuring 4.0cm, suggestive of an enlarged inguinal lymph node.  11/23/2015 10:20 AM  Gertie Fey, B.S., RVT, RDCS, RDMS

## 2015-11-23 NOTE — Progress Notes (Signed)
Patient noted to be asleep, with signs of OSA. Awakened and offered CPAP. Patient refuses. In conversation, patient denies nightly use at home (stating "sometimes I use it and sometimes I don't"). When attempting to educate on the health risks and complications of noncompliance, he states "I hear it all the time". Now that he is fully awake, CPAP offered again. He refuses and states "I'm not wearing it tonight. It's not comfortable". RN made aware. RT will continue to follow.

## 2015-11-24 ENCOUNTER — Inpatient Hospital Stay (HOSPITAL_COMMUNITY): Payer: Medicaid Other

## 2015-11-24 DIAGNOSIS — J9621 Acute and chronic respiratory failure with hypoxia: Secondary | ICD-10-CM

## 2015-11-24 DIAGNOSIS — J9601 Acute respiratory failure with hypoxia: Secondary | ICD-10-CM

## 2015-11-24 DIAGNOSIS — E662 Morbid (severe) obesity with alveolar hypoventilation: Secondary | ICD-10-CM

## 2015-11-24 DIAGNOSIS — J9622 Acute and chronic respiratory failure with hypercapnia: Secondary | ICD-10-CM

## 2015-11-24 DIAGNOSIS — R0600 Dyspnea, unspecified: Secondary | ICD-10-CM

## 2015-11-24 DIAGNOSIS — G4733 Obstructive sleep apnea (adult) (pediatric): Secondary | ICD-10-CM

## 2015-11-24 DIAGNOSIS — J189 Pneumonia, unspecified organism: Secondary | ICD-10-CM

## 2015-11-24 DIAGNOSIS — J9602 Acute respiratory failure with hypercapnia: Secondary | ICD-10-CM

## 2015-11-24 LAB — BLOOD GAS, ARTERIAL
ACID-BASE EXCESS: 7.9 mmol/L — AB (ref 0.0–2.0)
BICARBONATE: 38.3 meq/L — AB (ref 20.0–24.0)
Drawn by: 290171
EXPIRATORY PAP: 10
FIO2: 0.6
Inspiratory PAP: 25
Mode: POSITIVE
O2 Saturation: 98.2 %
PH ART: 7.267 — AB (ref 7.350–7.450)
PO2 ART: 125 mmHg — AB (ref 80.0–100.0)
Patient temperature: 98.6
RATE: 18 resp/min
TCO2: 34.8 mmol/L (ref 0–100)
pCO2 arterial: 86.9 mmHg (ref 35.0–45.0)

## 2015-11-24 LAB — HEMOGLOBIN A1C
HEMOGLOBIN A1C: 7 % — AB (ref 4.8–5.6)
Mean Plasma Glucose: 154 mg/dL

## 2015-11-24 LAB — BASIC METABOLIC PANEL
Anion gap: 7 (ref 5–15)
BUN: 14 mg/dL (ref 6–20)
CHLORIDE: 97 mmol/L — AB (ref 101–111)
CO2: 32 mmol/L (ref 22–32)
CREATININE: 1.15 mg/dL (ref 0.61–1.24)
Calcium: 8.9 mg/dL (ref 8.9–10.3)
GFR calc Af Amer: >60 mL/min (ref >60)
GFR calc non Af Amer: >60 mL/min (ref >60)
GLUCOSE: 138 mg/dL — AB (ref 65–99)
POTASSIUM: 5 mmol/L (ref 3.5–5.1)
SODIUM: 136 mmol/L (ref 135–145)

## 2015-11-24 LAB — CBC
HEMATOCRIT: 49.2 % (ref 39.0–52.0)
Hemoglobin: 14.8 g/dL (ref 13.0–17.0)
MCH: 28.4 pg (ref 26.0–34.0)
MCHC: 30.1 g/dL (ref 30.0–36.0)
MCV: 94.3 fL (ref 78.0–100.0)
PLATELETS: 178 10*3/uL (ref 150–400)
RBC: 5.22 MIL/uL (ref 4.22–5.81)
RDW: 14.3 % (ref 11.5–15.5)
WBC: 15.9 10*3/uL — AB (ref 4.0–10.5)

## 2015-11-24 LAB — ECHOCARDIOGRAM COMPLETE
HEIGHTINCHES: 69 in
Weight: 5184 oz

## 2015-11-24 LAB — GLUCOSE, CAPILLARY
GLUCOSE-CAPILLARY: 140 mg/dL — AB (ref 65–99)
Glucose-Capillary: 168 mg/dL — ABNORMAL HIGH (ref 65–99)
Glucose-Capillary: 146 mg/dL — ABNORMAL HIGH (ref 65–99)
Glucose-Capillary: 135 mg/dL — ABNORMAL HIGH (ref 65–99)
Glucose-Capillary: 141 mg/dL — ABNORMAL HIGH (ref 65–99)

## 2015-11-24 LAB — VANCOMYCIN, TROUGH: VANCOMYCIN TR: 8 ug/mL — AB (ref 15–20)

## 2015-11-24 MED ORDER — DEXTROSE 5 % IV SOLN
2.0000 g | Freq: Three times a day (TID) | INTRAVENOUS | Status: DC
  Administered 2015-11-24 – 2015-11-30 (×18): 2 g via INTRAVENOUS
  Filled 2015-11-24 (×20): qty 2

## 2015-11-24 MED ORDER — FENTANYL CITRATE (PF) 100 MCG/2ML IJ SOLN
100.0000 ug | Freq: Once | INTRAMUSCULAR | Status: AC
  Administered 2015-11-24: 100 ug via INTRAVENOUS

## 2015-11-24 MED ORDER — IPRATROPIUM-ALBUTEROL 0.5-2.5 (3) MG/3ML IN SOLN
3.0000 mL | Freq: Four times a day (QID) | RESPIRATORY_TRACT | Status: DC
  Administered 2015-11-24 – 2015-12-07 (×51): 3 mL via RESPIRATORY_TRACT
  Filled 2015-11-24 (×53): qty 3

## 2015-11-24 MED ORDER — MIDAZOLAM HCL 2 MG/2ML IJ SOLN
4.0000 mg | Freq: Once | INTRAMUSCULAR | Status: AC
Start: 2015-11-24 — End: 2015-11-24
  Administered 2015-11-24: 4 mg via INTRAVENOUS

## 2015-11-24 MED ORDER — FUROSEMIDE 10 MG/ML IJ SOLN
40.0000 mg | Freq: Once | INTRAMUSCULAR | Status: AC
  Administered 2015-11-24: 40 mg via INTRAVENOUS
  Filled 2015-11-24: qty 4

## 2015-11-24 MED ORDER — ETOMIDATE 2 MG/ML IV SOLN
40.0000 mg | Freq: Once | INTRAVENOUS | Status: AC
  Administered 2015-11-24: 40 mg via INTRAVENOUS

## 2015-11-24 MED ORDER — VANCOMYCIN HCL 10 G IV SOLR
1250.0000 mg | Freq: Three times a day (TID) | INTRAVENOUS | Status: DC
  Administered 2015-11-24 – 2015-11-25 (×2): 1250 mg via INTRAVENOUS
  Filled 2015-11-24 (×3): qty 1250

## 2015-11-24 MED ORDER — MIDAZOLAM HCL 2 MG/2ML IJ SOLN
2.0000 mg | Freq: Once | INTRAMUSCULAR | Status: AC
Start: 2015-11-24 — End: 2015-11-25

## 2015-11-24 MED ORDER — PROPOFOL 1000 MG/100ML IV EMUL
0.0000 ug/kg/min | INTRAVENOUS | Status: DC
  Administered 2015-11-24: 20 ug/kg/min via INTRAVENOUS
  Administered 2015-11-25: 15 ug/kg/min via INTRAVENOUS
  Administered 2015-11-26: 17 ug/kg/min via INTRAVENOUS
  Administered 2015-11-26 (×2): 12 ug/kg/min via INTRAVENOUS
  Administered 2015-11-27: 23 ug/kg/min via INTRAVENOUS
  Administered 2015-11-27: 17 ug/kg/min via INTRAVENOUS
  Administered 2015-11-27: 21 ug/kg/min via INTRAVENOUS
  Administered 2015-11-27: 23 ug/kg/min via INTRAVENOUS
  Administered 2015-11-27: 21 ug/kg/min via INTRAVENOUS
  Administered 2015-11-28 (×5): 23 ug/kg/min via INTRAVENOUS
  Administered 2015-11-29: 28 ug/kg/min via INTRAVENOUS
  Administered 2015-11-29: 23 ug/kg/min via INTRAVENOUS
  Administered 2015-11-29 (×2): 26 ug/kg/min via INTRAVENOUS
  Administered 2015-11-29: 25 ug/kg/min via INTRAVENOUS
  Administered 2015-11-29: 23 ug/kg/min via INTRAVENOUS
  Administered 2015-11-30: 25 ug/kg/min via INTRAVENOUS
  Administered 2015-11-30 (×3): 30 ug/kg/min via INTRAVENOUS
  Administered 2015-11-30: 28 ug/kg/min via INTRAVENOUS
  Administered 2015-12-01 (×2): 35 ug/kg/min via INTRAVENOUS
  Filled 2015-11-24 (×33): qty 100

## 2015-11-24 MED ORDER — FENTANYL CITRATE (PF) 100 MCG/2ML IJ SOLN
INTRAMUSCULAR | Status: AC
  Filled 2015-11-24: qty 2

## 2015-11-24 MED ORDER — SODIUM CHLORIDE 0.9 % IV SOLN
Freq: Once | INTRAVENOUS | Status: AC
  Administered 2015-11-24: via INTRAVENOUS

## 2015-11-24 MED ORDER — FENTANYL CITRATE (PF) 100 MCG/2ML IJ SOLN
100.0000 ug | INTRAMUSCULAR | Status: DC | PRN
  Administered 2015-11-25 (×2): 100 ug via INTRAVENOUS
  Filled 2015-11-24 (×2): qty 2

## 2015-11-24 MED ORDER — MIDAZOLAM HCL 2 MG/2ML IJ SOLN
INTRAMUSCULAR | Status: AC
  Filled 2015-11-24: qty 2

## 2015-11-24 MED ORDER — MIDAZOLAM HCL 2 MG/2ML IJ SOLN
INTRAMUSCULAR | Status: AC
  Administered 2015-11-24: 23:00:00
  Filled 2015-11-24: qty 2

## 2015-11-24 MED ORDER — PROPOFOL 1000 MG/100ML IV EMUL
INTRAVENOUS | Status: AC
  Filled 2015-11-24: qty 100

## 2015-11-24 MED ORDER — SODIUM CHLORIDE 0.9 % IV SOLN: INTRAVENOUS | Status: AC

## 2015-11-24 MED ORDER — MIDAZOLAM HCL 2 MG/2ML IJ SOLN
INTRAMUSCULAR | Status: AC
Start: 2015-11-24 — End: 2015-11-24
  Administered 2015-11-24: 4 mg via INTRAVENOUS
  Filled 2015-11-24: qty 2

## 2015-11-24 MED ORDER — ETOMIDATE 2 MG/ML IV SOLN
0.3000 mg/kg | Freq: Once | INTRAVENOUS | Status: AC
Start: 2015-11-24 — End: 2015-11-24
  Administered 2015-11-24: 44.1 mg via INTRAVENOUS

## 2015-11-24 MED ORDER — FENTANYL CITRATE (PF) 100 MCG/2ML IJ SOLN
INTRAMUSCULAR | Status: AC
  Administered 2015-11-24: 100 ug via INTRAVENOUS
  Filled 2015-11-24: qty 2

## 2015-11-24 MED ORDER — FENTANYL CITRATE (PF) 100 MCG/2ML IJ SOLN: 100.0000 ug | INTRAMUSCULAR | Status: DC | PRN

## 2015-11-24 NOTE — Progress Notes (Signed)
Pharmacy Antibiotic Note  Kenneth Mcfarland is a 35 y.o. male admitted on 07-Dec-2015 with cellulitis.  PMH includes DM and pt reports recently cuttling left leg.  In the ED patient was ordered Vancomycin 1gm, Aztreonam 2gm and Metronidazole 500mg  IV x 1 dose each.  Pharmacy has been consulted for Vancomycin, Metronidazole & Aztreonam dosing on 7/30. Flagyl and Azactam were changed to Rocephin on 7/31 now to change Rocephin to Cefepime today 8/1  per pharmacy consult  For further treatment of HAP  Plan:  Continue vancomycin 1250mg  q12 - will check trough prior to next dose at 5pm today  Vancomycin trough goal adjusted now for HAP: 15-20 mcg/ml  Cefepime 2g IV q8 for CrCl > 60 ml/min  F/U renal function (CrCln ~ 74 ml/min)  F/U cultures and sensitivities  Height: 5\' 9"  (175.3 cm) Weight: (!) 324 lb (147 kg) IBW/kg (Calculated) : 70.7  Temp (24hrs), Avg:100 F (37.8 C), Min:97.9 F (36.6 C), Max:101.4 F (38.6 C)   Recent Labs Lab 11/28/2015 0340 12/01/2015 0349 11/23/15 0353 11/24/15 0310  WBC 28.1*  --  23.9* 15.9*  CREATININE 1.40*  --  1.21  1.20 1.15  LATICACIDVEN  --  1.48  --   --     Estimated Creatinine Clearance: 127.1 mL/min (by C-G formula based on SCr of 1.15 mg/dL).    Allergies  Allergen Reactions  . Bee Venom Anaphylaxis  . Contrast Media [Iodinated Diagnostic Agents] Nausea And Vomiting       . Wasp Venom Anaphylaxis  . Cherry Other (See Comments)    unknown  . Penicillins Other (See Comments)    Childhood allergic reaction - no other information available  . Vicodin [Hydrocodone-Acetaminophen] Diarrhea    Antimicrobials this admission: 7/30 Vanc >>   7/30 Flagyl >> 7/31 7/30 Aztreonam >> 7/31 7/31 Rocephin >> 8/1 8/1 Cefepime  >>   Dose adjustments this admission:    Microbiology results: 7/30 BCx:  ngtd 7/30 UCx:  Multiple - recollection suggested 7/30 MRSA PCR: negative  Thank you for allowing pharmacy to be a part of this patient's  care.  Hessie Knows, PharmD, BCPS Pager [phone] 11/24/2015 10:20 AM

## 2015-11-24 NOTE — Progress Notes (Signed)
Airway obstruction with snoring respirations persist with low spontaneous volumes. IPAP increased to 25cm for return volume ranges 350-577mL.

## 2015-11-24 NOTE — Progress Notes (Signed)
Pt extremely lethargic and not aroused by physical stimulation. Pt remained drowsy for about 1 minute and then responded to his voice. Elink notified and new orders given. Pt also extremely non compliant with bipap treatment. Pt states he believes "the hospital has made him sicker, and that we have done this to him." Pt threw bipap mask unto the floor and said "he does not like air being pushed into his lungs." Education was completed with patient about plan of care and seriousness of his respiratory status. Will continue to monitor.

## 2015-11-24 NOTE — Progress Notes (Signed)
PCCM Interval Note  Asked to evaluate patient with worsening mental status and progressive hypercapnia despite BiPAP   On my exam: Obese man, appears to be comfortable on BiPAP, Vt's 250-500's Very distant coarse breath sounds LLE with some warmth, darker skin Wakes to voice, will attempt to carry on a conversation but confused, unable to answer questions consistently, falls back asleep easily if not stimulated   Recent Labs Lab 12-21-2015 0657 11/24/15 2040  PHART 7.315* 7.267*  PCO2ART 61.4* 86.9*  PO2ART 281* 125*  HCO3 30.4* 38.3*  TCO2 26.6 34.8  O2SAT 99.3 98.2    Impression:  Acute on chronic hypercapneic and hypoxemic respiratory failure in setting cellulitis and possible PNA (CXR difficult to interpret due to body habitus. He has worsened despite wearing BiPAp consistently for several hours. I believe he will progress to overt failure tonight if we continue this trajectory.   Plans:  ETT intubation now to facilitate MV Sedation  Continue current abx Obtain resp cx once he has an airway  Independent CC time 35 minutes  Levy Pupa, MD, PhD 11/24/2015, 10:31 PM  Pulmonary and Critical Care [phone] or if no answer [phone]

## 2015-11-24 NOTE — Progress Notes (Signed)
PT Cancellation Note  Patient Details Name: Kenneth Mcfarland MRN: [id number] DOB: Aug 28, 1980   Cancelled Treatment:    Reason Eval/Treat Not Completed: Medical issues which prohibited therapy (required  BiPAP and currently on. will check  back later today. )   Rada Hay 11/24/2015, 7:39 AM Blanchard Kelch PT [phone]

## 2015-11-24 NOTE — Consult Note (Signed)
PULMONARY / CRITICAL CARE MEDICINE   Name: Kenneth Mcfarland MRN: [id number] DOB: [birth year]    ADMISSION DATE:  12/11/15 CONSULTATION DATE:  11/24/2015  REFERRING MD:  Dr. Robb Matar, [facility]  CHIEF COMPLAINT:  Short of breath  HISTORY OF PRESENT ILLNESS:   36 yo male presented to ER with Lt leg pain.  He had fever, nausea and vomiting, and diarrhea for several days prior to admission.  He was started on antibiotics for Lt leg cellulitis.  He was noted to progressive hypoxia, and started on supplemental oxygen.  He does not normally use oxygen at home.  He was also started on CPAP >> he did not want to use CPAP because he said he was not used to having so much air blow into his face.  He became more somnolent, and was then started on BiPAP.  He had doppler of left leg >> negative, but limited study due to body habitus.  He has history of severe obstructive sleep apnea, and has been followed by Dr. Vickey Huger with [facility] for sleep apnea.  He was on CPAP, but reportedly this broke 2 weeks prior to admission.  I am not certain he has been compliant with CPAP otherwise.  He carries a diagnosis of narcolepsy, but it is unclear how this diagnosis was made.  He had ABG from which showed pH 7.35, PCO2 55.8, PO2 74.  His ABG from this admission on December 11, 2015 showed pH 7.31, PCO2 61.4, PO2 281 on supplemental oxygen.  IgE from 05/15/15 was 621.  He has an albuterol inhaler.  He is not aware of diagnosis of asthma, or COPD.  He smoked 1/2 pack per day, and reports he last smoked 1 month prior to admission.  He has a hx of THC.  He denies hx of pneumonia or exposure to tuberculosis.  He has cough with chest congestion.  He denies chest or abdominal pain.  He still has intermittent fever.  Leg pain is better, but has more swelling.  PAST MEDICAL HISTORY :  He  has a past medical history of Asthma; Bipolar disorder (HCC); Diabetes mellitus without complication (HCC); Hypertension; Narcolepsy; PTSD (post-traumatic  stress disorder); Sleep apnea; Sleep apnea; Sleep apnea; and Swelling of eye, left (04/2015).  PAST SURGICAL HISTORY: He  has a past surgical history that includes Tonsillectomy.  Allergies  Allergen Reactions  . Bee Venom Anaphylaxis  . Contrast Media [Iodinated Diagnostic Agents] Nausea And Vomiting       . Wasp Venom Anaphylaxis  . Cherry Other (See Comments)    unknown  . Penicillins Other (See Comments)    Childhood allergic reaction - no other information available  . Vicodin [Hydrocodone-Acetaminophen] Diarrhea    No current facility-administered medications on file prior to encounter.    Current Outpatient Prescriptions on File Prior to Encounter  Medication Sig  . ALPRAZolam (XANAX) 1 MG tablet Take 1 mg by mouth 3 (three) times daily as needed for anxiety.   . dicyclomine (BENTYL) 20 MG tablet Take 1 tablet (20 mg total) by mouth 2 (two) times daily as needed (for abdominal pain).  . furosemide (LASIX) 20 MG tablet Take 1 tablet (20 mg total) by mouth 2 (two) times daily. (Patient taking differently: Take 20 mg by mouth daily. )  . gabapentin (NEURONTIN) 300 MG capsule Take 1 capsule (300 mg total) by mouth 2 (two) times daily. Take 1 at bedtime for 1 week, then increase to twice a day. (Patient taking differently: Take 300 mg by mouth 2 (  two) times daily. )  . metFORMIN (GLUCOPHAGE) 500 MG tablet TAKE 1 TABLET BY MOUTH TWICE A DAY WITH MEALS (Patient taking differently: Take 500 mg by mouth 2 (two) times daily with a meal. )  . ondansetron (ZOFRAN ODT) 4 MG disintegrating tablet Take 1 tablet (4 mg total) by mouth every 8 (eight) hours as needed for nausea or vomiting.  Marland Kitchen oxyCODONE (OXY IR/ROXICODONE) 5 MG immediate release tablet Take 1 tablet (5 mg total) by mouth every 6 (six) hours as needed (pain). (Patient taking differently: Take 10 mg by mouth 2 (two) times daily as needed (pain). )  . pantoprazole (PROTONIX) 40 MG tablet Take 1 tablet (40 mg total) by mouth daily.   . pirbuterol (MAXAIR) 200 MCG/INH inhaler Inhale 2 puffs into the lungs every 6 (six) hours as needed for wheezing.    FAMILY HISTORY:  His is adopted.    SOCIAL HISTORY: He  reports that he has been smoking Cigarettes.  He has a 3.75 pack-year smoking history. He uses smokeless tobacco. He reports that he drinks alcohol. He reports that he uses drugs, including Marijuana, about 5 times per week.  REVIEW OF SYSTEMS:   Negative except above.  SUBJECTIVE:  C/o cough.  VITAL SIGNS: BP 133/63   Pulse (!) 108   Temp (!) 100.4 F (38 C) (Axillary) Comment: pt assessed, felt too warm, temp checked again  Resp (!) 21   Ht 5\' 9"  (1.753 m)   Wt (!) 324 lb (147 kg)   SpO2 95%   BMI 47.85 kg/m   HEMODYNAMICS:    VENTILATOR SETTINGS: FiO2 (%):  [45 %-60 %] 55 %  INTAKE / OUTPUT: I/O last 3 completed shifts: In: 1540 [P.O.:720; I.V.:70; IV Piggyback:750] Out: 2550 [Urine:2550]  PHYSICAL EXAMINATION: General: somnolent Neuro:  Wakes up and follows commands, moves extremities HEENT:  Sclera injected, MP 4, no stridor Cardiovascular:  Regular, tachycardic Lungs:  B/l crackles, no wheeze Abdomen:  Obese, soft, non tender Musculoskeletal:  3+ edema Skin:  Excoriation Lt leg  LABS:  BMET  Recent Labs Lab 11/04/2015 0340 11/23/15 0353 11/24/15 0310  NA 133* 135  135 136  K 4.1 3.9  3.9 5.0  CL 97* 101  100* 97*  CO2 28 29  30  32  BUN 13 15  14 14   CREATININE 1.40* 1.21  1.20 1.15  GLUCOSE 155* 129*  128* 138*    Electrolytes  Recent Labs Lab 10/29/2015 0340 11/23/15 0353 11/24/15 0310  CALCIUM 8.6* 8.6*  8.6* 8.9  PHOS  --  3.4  --     CBC  Recent Labs Lab 11/10/2015 0340 11/23/15 0353 11/24/15 0310  WBC 28.1* 23.9* 15.9*  HGB 17.2* 14.7 14.8  HCT 51.1 46.6 49.2  PLT 185 178 178    Coag's No results for input(s): APTT, INR in the last 168 hours.  Sepsis Markers  Recent Labs Lab 11/08/2015 0349  LATICACIDVEN 1.48    ABG  Recent  Labs Lab 11/21/2015 0657  PHART 7.315*  PCO2ART 61.4*  PO2ART 281*    Liver Enzymes  Recent Labs Lab 11/02/2015 0340 11/23/15 0353  AST 24  --   ALT 19  --   ALKPHOS 78  --   BILITOT 0.6  --   ALBUMIN 3.5 2.9*    Cardiac Enzymes No results for input(s): TROPONINI, PROBNP in the last 168 hours.  Glucose  Recent Labs Lab 10/28/2015 2136 11/23/15 0836 11/23/15 1214 11/23/15 1646 11/23/15 2303 11/24/15 0749  GLUCAP 187*  155* 171* 132* 138* 168*    Imaging Dg Chest Port 1 View  Result Date: 11/24/2015 CLINICAL DATA:  Pt is morbidly obese. In respiratory distress. Pt could not be sat up more from current position. Pt's eyes are swollen, very red, and were rolled up into top of head. Pt has severe infection/fluid collection in left leg. Hx of asthma and diabetes. EXAM: PORTABLE CHEST 1 VIEW COMPARISON:  11/20/2015 FINDINGS: A Celsius airspace opacity projects within the left mid to upper lung peripherally. Right lung is clear. No convincing pleural effusion and no pneumothorax. The cardiac silhouette is normal in size configuration. No mediastinal or hilar masses. Bony thorax is unremarkable. IMPRESSION: Left mid to upper lung consolidation consistent with pneumonia. Electronically Signed   By: Amie Portland M.D.   On: 11/24/2015 09:39     STUDIES:  PSG 03/03/15 >> AHI 108, SpO2 low 60%.  CPAP 16 >> AHI 0 Doppler Lt leg 11/23/15 >> negative for DVT, limited study Echo 11/24/15 >>   CULTURES: 7/30 Blood >>  8/01 Sputum >>  ANTIBIOTICS: 7/29 Aztreonam >> 7/30 7/29 Flagyl >> 7/30 7/29 Vancomycin >> 7/31 Rocephin >> 8/01 8/01 Cefepime >>   SIGNIFICANT EVENTS: 7/29 Admit  LINES/TUBES:  DISCUSSION: 35 yo male admitted with sepsis from Lt leg cellulitis.  Has hx of tobacco abuse and severe obstructive sleep apnea.  He has been non compliant with CPAP as outpt.  He has previous ABG showing hypoxia/hypercapnia, and this is most likely related to obesity hypoventilation  syndrome.  He has previously elevated IgE.  He has progressive respiratory distress with hypoxia, productive cough, and edema.  CXR shows changes of PNA.  ASSESSMENT / PLAN:  Acute on chronic hypoxic, hypercapnic respiratory failure from HCAP, OSA/OHS, possible obstructive airways disease, presumed diastolic CHF. - oxygen to keep SpO2 90 to 95% - suspicion for thromboembolic disease is low >> defer V/Q scan for now  Sepsis with HCAP and Lt leg cellulitis. - Change Abx to vancomycin and cefepime - f/u sputum cx, procalcitonin  OSA/OHS. - BiPAP qhs and prn during the day >> compliance with therapy will be an issue - weight loss  Hx of tobacco abuse with possible obstructive lung disease. - scheduled BDs - bronchial hygiene - smoking cessation - will need PFTs as outpt  Presumed diastolic CHF with volume overload. - KVO IV fluids - Lasix 40 mg IV x one 8/01 - f/u Echo  Reported hx of narcolepsy. - I suspect he sleep issues are related to sleep disordered breathing rather than narcolepsy >> would not treat with stimulant medications until he is compliant with therapy for OSA/OHS  DM with neuropathy. - per primary team  CC time 43 minutes.  Coralyn Helling, MD [facility] Pulmonary/Critical Care 11/24/2015, 9:54 AM Pager:  [phone] After 3pm call: [phone]

## 2015-11-24 NOTE — Progress Notes (Signed)
[facility] PROGRESS NOTE    Progress Note  Kenneth Mcfarland  VHQ:[id number] DOB: [birth year] DOA: 2015/12/18 PCP: Jacklynn Barnacle, NP     Brief Narrative:   Kenneth Mcfarland is an 35 y.o. male multiple medical problems including diabetes, narcolepsy, obstructive sleep apnea in the middle of the night with left lower actually pain he denies any recent trauma.  Assessment/Plan:   Sepsis due to Left lower ext Cellulitis Cont IV Vanc and Rocephin. Blood cultures are negative till date. His leukocytosis is slowly improving, Doppler showed no lower ext DVT. Try keep his leg elevated above his heart level.  Acute on chronic respiratory failure with hypoxia and hypercarbia: Vq scan pending. ? Due to not wearing his Bipap and on going hypoxia at home. Cont Venti mask. Not improving requiring high flow oxygen and bi-pap during the day Will consult PCCM.  Mild tachycardia: Lower extremity Doppler and VQ scan are pending to rule out a PE. Echo pending.  Acute kidney injury: Baseline creatinine around 1.0. Resume gentle IV hydration.  Obstructive sleep apnea/obesity hypoventilation syndrome: Continues C-pap.  Major depressive disorder, recurrent severe without psychotic features (HCC) Continue current home medications.  Narcolepsy Continue home regimen.  Essential hypertension Cont  lisinopril blood pressure is improved  Morbid obesity (HCC) Counseling.  Type 2 diabetes mellitus with diabetic neuropathy, without long-term current use of insulin (HCC) Metformin was resumed continue sliding scale insulin. Glucose well, controlled A1c is pending.  Tobacco abuse Counseling.  PTSD (post-traumatic stress disorder)/ Paranoid schizophrenia (HCC) Stable.   DVT prophylaxis: lovenox Family Communication:none Disposition Plan/Barrier to D/C: unable to determine Code Status:     Code Status Orders        Start     Ordered   12/18/2015 0556  Full code  Continuous     18-Dec-2015 0556    Code Status History    Date Active Date Inactive Code Status Order ID Comments User Context   05/13/2015 12:20 PM 05/17/2015  4:53 PM Full Code [id number]  Onnie Boer, MD ED   08/11/2014  6:37 PM 08/12/2014  4:40 PM Full Code [id number]  Everlene Farrier, PA-C ED        IV Access:    Peripheral IV   Procedures and diagnostic studies:   No results found.   Medical Consultants:    None.  Anti-Infectives:   Vancomycin on Rocephin.  Subjective:    Kenneth Mcfarland he relates he continues to have left lower actually pain. He does not uses c-pap during the day.  Objective:    Vitals:   11/24/15 0340 11/24/15 0400 11/24/15 0500 11/24/15 0600  BP:  (!) 154/88 (!) 155/76 133/63  Pulse:  (!) 115 (!) 110 (!) 108  Resp:  (!) 29 (!) 23 (!) 21  Temp: 99.7 F (37.6 C)     TempSrc: Axillary     SpO2:  95% 94% 95%  Weight:      Height:        Intake/Output Summary (Last 24 hours) at 11/24/15 0734 Last data filed at 11/24/15 0600  Gross per 24 hour  Intake              300 ml  Output             1400 ml  Net            -1100 ml   Filed Weights   2015-12-18 0600 12-18-2015 0758 11/23/15 0629  Weight: (!) 154.7 kg (341 lb) (!) 142.4 kg (314  lb) (!) 147 kg (324 lb)    Exam: General exam: In no acute distress. Respiratory system: Good air movement and clear to auscultation. Cardiovascular system: S1 & S2 heard, RRR.  Gastrointestinal system: Abdomen is nondistended, soft and nontender.  Central nervous system: Alert and oriented. No focal neurological deficits. Extremities: No pedal edema. Skin: Multiple mild blisters on his abdominal wall, his left lower extremity extremely swollen hematemesis went to touch and tender to palpation. Psychiatry: Judgement and insight appear normal. Mood & affect appropriate.    Data Reviewed:    Labs: Basic Metabolic Panel:  Recent Labs Lab 11/21/2015 0340 11/23/15 0353 11/24/15 0310  NA 133* 135  135 136  K  4.1 3.9  3.9 5.0  CL 97* 101  100* 97*  CO2 28 29  30  32  GLUCOSE 155* 129*  128* 138*  BUN 13 15  14 14   CREATININE 1.40* 1.21  1.20 1.15  CALCIUM 8.6* 8.6*  8.6* 8.9  PHOS  --  3.4  --    GFR Estimated Creatinine Clearance: 127.1 mL/min (by C-G formula based on SCr of 1.15 mg/dL). Liver Function Tests:  Recent Labs Lab 11/10/2015 0340 11/23/15 0353  AST 24  --   ALT 19  --   ALKPHOS 78  --   BILITOT 0.6  --   PROT 8.1  --   ALBUMIN 3.5 2.9*   No results for input(s): LIPASE, AMYLASE in the last 168 hours. No results for input(s): AMMONIA in the last 168 hours. Coagulation profile No results for input(s): INR, PROTIME in the last 168 hours.  CBC:  Recent Labs Lab 11/21/2015 0340 11/23/15 0353 11/24/15 0310  WBC 28.1* 23.9* 15.9*  NEUTROABS 25.5* 21.5*  --   HGB 17.2* 14.7 14.8  HCT 51.1 46.6 49.2  MCV 88.0 90.8 94.3  PLT 185 178 178   Cardiac Enzymes: No results for input(s): CKTOTAL, CKMB, CKMBINDEX, TROPONINI in the last 168 hours. BNP (last 3 results) No results for input(s): PROBNP in the last 8760 hours. CBG:  Recent Labs Lab 11/03/2015 2136 11/23/15 0836 11/23/15 1214 11/23/15 1646 11/23/15 2303  GLUCAP 187* 155* 171* 132* 138*   D-Dimer: No results for input(s): DDIMER in the last 72 hours. Hgb A1c:  Recent Labs  11/23/15 0353  HGBA1C 7.0*   Lipid Profile: No results for input(s): CHOL, HDL, LDLCALC, TRIG, CHOLHDL, LDLDIRECT in the last 72 hours. Thyroid function studies: No results for input(s): TSH, T4TOTAL, T3FREE, THYROIDAB in the last 72 hours.  Invalid input(s): FREET3 Anemia work up: No results for input(s): VITAMINB12, FOLATE, FERRITIN, TIBC, IRON, RETICCTPCT in the last 72 hours. Sepsis Labs:  Recent Labs Lab 11/16/2015 0340 10/25/2015 0349 11/23/15 0353 11/24/15 0310  WBC 28.1*  --  23.9* 15.9*  LATICACIDVEN  --  1.48  --   --    Microbiology Recent Results (from the past 240 hour(s))  Urine culture     Status:  Abnormal   Collection Time: 11/15/2015  3:30 AM  Result Value Ref Range Status   Specimen Description URINE, RANDOM  Final   Special Requests NONE  Final   Culture MULTIPLE SPECIES PRESENT, SUGGEST RECOLLECTION (A)  Final   Report Status 11/23/2015 FINAL  Final  Blood Culture (routine x 2)     Status: None (Preliminary result)   Collection Time: 11/08/2015  3:40 AM  Result Value Ref Range Status   Specimen Description BLOOD RIGHT ANTECUBITAL  Final   Special Requests BOTTLES DRAWN AEROBIC AND ANAEROBIC  5CC  Final   Culture   Final    NO GROWTH 1 DAY Performed at [facility]    Report Status PENDING  Incomplete  Blood Culture (routine x 2)     Status: None (Preliminary result)   Collection Time: 12-14-15  4:12 AM  Result Value Ref Range Status   Specimen Description BLOOD LEFT ANTECUBITAL  Final   Special Requests IN PEDIATRIC BOTTLE 4CC  Final   Culture   Final    NO GROWTH 1 DAY Performed at [facility]    Report Status PENDING  Incomplete  MRSA PCR Screening     Status: None   Collection Time: 12/14/2015 10:36 PM  Result Value Ref Range Status   MRSA by PCR NEGATIVE NEGATIVE Final    Comment:        The GeneXpert MRSA Assay (FDA approved for NASAL specimens only), is one component of a comprehensive MRSA colonization surveillance program. It is not intended to diagnose MRSA infection nor to guide or monitor treatment for MRSA infections.      Medications:   . antiseptic oral rinse  7 mL Mouth Rinse BID  . cefTRIAXone (ROCEPHIN)  IV  1 g Intravenous Q24H  . enoxaparin (LOVENOX) injection  75 mg Subcutaneous Q24H  . gabapentin  300 mg Oral BID  . insulin aspart  0-15 Units Subcutaneous TID WC  . insulin aspart  0-5 Units Subcutaneous QHS  . lisinopril  5 mg Oral Daily  . metFORMIN  500 mg Oral BID WC  . nicotine  14 mg Transdermal Daily  . pantoprazole  40 mg Oral Daily  . vancomycin  1,250 mg Intravenous Q12H   Continuous Infusions:   Time  spent: 35 min   LOS: 2 days   Marinda Elk  [facility] Pager [phone]  *Please refer to [email or web address], password [email or web address] to get updated schedule on who will round on this patient, as hospitalists switch teams weekly. If 7PM-7AM, please contact night-coverage at [email or web address], password [email or web address] for any overnight needs.  11/24/2015, 7:34 AM

## 2015-11-24 NOTE — Progress Notes (Signed)
eLink Physician-Brief Progress Note Patient Name: Kenneth Mcfarland DOB: 02-29-1981 MRN: [id number]   Date of Service  11/24/2015  HPI/Events of Note  Progressive hypercapnia despite high biPAP settings, somnolent PH 7.31-->7.26 pco2 61-->86 on Ipap 25, Epap 10  fio2 60% TV around 500-600's  eICU Interventions  PCCM attending called to evaluate patient at bedside-will likely need intubation     Intervention Category Major Interventions: Respiratory failure - evaluation and management  Tyler Cubit 11/24/2015, 9:15 PM

## 2015-11-24 NOTE — Procedures (Signed)
Intubation Procedure Note Kenneth Mcfarland [id number] 1980-10-31  Procedure: Intubation Indications: Respiratory insufficiency  Procedure Details Consent: Risks of procedure as well as the alternatives and risks of each were explained to the (patient/caregiver).  Consent for procedure obtained. Time Out: Verified patient identification, verified procedure, site/side was marked, verified correct patient position, special equipment/implants available, medications/allergies/relevent history reviewed, required imaging and test results available.  Performed  Maximum sterile technique was used including gloves, gown and hand hygiene.  Mac 4 Glidescope  Difficult intubation, good view of epiglottis only, fleeting view of cords that appeared to move normally. 7.5 ETT placed after 8.0 could not be advanced. Transient desat to the 70's with first look, recovered with bag-mask ventilation.     Evaluation Hemodynamic Status: BP stable throughout; O2 sats: transiently fell during during procedure Patient's Current Condition: stable Complications: Complications of Patient appeared to bite his tongue, some associated bleeding from laceration Patient did tolerate procedure well. Chest X-ray ordered to verify placement.  CXR: pending.    Levy Pupa, MD, PhD 11/24/2015, 10:26 PM [facility] [phone] or if no answer [phone]

## 2015-11-24 NOTE — Progress Notes (Signed)
Placed on BiPAP for increased lethargy with desaturations. Settings titrated to IPAP 20cmH2O EPAP 8cmH2O for target volumes greater than . BUR remains 16 per prior settings. RT will continue to follow.

## 2015-11-24 NOTE — Progress Notes (Signed)
Patient taken off of BiPAP at around 745 per RN. Patient is currently on a venti mask 55% with O2 sats of 93%. No distress noted at this time.

## 2015-11-24 NOTE — Progress Notes (Signed)
eLink Physician-Brief Progress Note Patient Name: Brandtley Linson DOB: 1980-12-15 MRN: [id number]   Date of Service  11/24/2015  HPI/Events of Note  morbidly obese AAM with increased somnolence on biPAP  eICU Interventions  Check FSBS, check ABG     Intervention Category Major Interventions: Change in mental status - evaluation and management  Romelle Reiley 11/24/2015, 7:31 PM

## 2015-11-24 NOTE — Progress Notes (Signed)
Pharmacy: Re- vancomycin  Patient is a 35 y.o M currently on abx for cellulitis and now also for PNA.  Scr improving. Vancomycin trough level now back subtherapeutic at 8 (goal 15-20) with current regimen of 1250 mg IV q12h.  Plan: - change vancomycin to 1250 gm IV q8h - recheck level at steady state - monitor renal function  Dorna Leitz, PharmD, BCPS 11/24/2015 4:45 PM

## 2015-11-24 NOTE — Progress Notes (Signed)
*  PRELIMINARY RESULTS* Echocardiogram 2D Echocardiogram has been performed.  Jeryl Columbia 11/24/2015, 11:01 AM

## 2015-11-24 NOTE — Progress Notes (Signed)
eLink Physician-Brief Progress Note Patient Name: Kenneth Mcfarland DOB: 19-Jul-1980 MRN: [id number]   Date of Service  11/24/2015  HPI/Events of Note  Pt intubated earlier for resp fx.  BP 70/40, 100, 20, 97%  Starting to be agitated.   eICU Interventions  CXR - acceptable position of ETT. Bolus 500 mls IVF now Versed 2 mg IV now. If BP does not go up soo, will start pressors sooner rather than later so we can sedated pt.  F/u abg.      Intervention Category Major Interventions: Other:  Daneen Schick Dios 11/24/2015, 11:42 PM

## 2015-11-24 DEATH — deceased

## 2015-11-25 ENCOUNTER — Inpatient Hospital Stay (HOSPITAL_COMMUNITY): Payer: Medicaid Other

## 2015-11-25 ENCOUNTER — Inpatient Hospital Stay (HOSPITAL_COMMUNITY): Payer: Medicaid Other | Admitting: Certified Registered Nurse Anesthetist

## 2015-11-25 LAB — BLOOD GAS, ARTERIAL
ACID-BASE EXCESS: 6.9 mmol/L — AB (ref 0.0–2.0)
Bicarbonate: 32.7 mEq/L — ABNORMAL HIGH (ref 20.0–24.0)
DRAWN BY: 290171
FIO2: 100
MECHVT: 520 mL
O2 SAT: 99.5 %
PATIENT TEMPERATURE: 100
PEEP/CPAP: 10 cmH2O
PH ART: 7.396 (ref 7.350–7.450)
PO2 ART: 235 mmHg — AB (ref 80.0–100.0)
RATE: 20 resp/min
TCO2: 28.8 mmol/L (ref 0–100)
pCO2 arterial: 55 mmHg — ABNORMAL HIGH (ref 35.0–45.0)

## 2015-11-25 LAB — BASIC METABOLIC PANEL
Anion gap: 6 (ref 5–15)
BUN: 22 mg/dL — AB (ref 6–20)
CALCIUM: 8.7 mg/dL — AB (ref 8.9–10.3)
CHLORIDE: 97 mmol/L — AB (ref 101–111)
CO2: 34 mmol/L — ABNORMAL HIGH (ref 22–32)
CREATININE: 1.58 mg/dL — AB (ref 0.61–1.24)
GFR calc non Af Amer: 55 mL/min — ABNORMAL LOW (ref >60)
Glucose, Bld: 145 mg/dL — ABNORMAL HIGH (ref 65–99)
Potassium: 5 mmol/L (ref 3.5–5.1)
SODIUM: 137 mmol/L (ref 135–145)

## 2015-11-25 LAB — MAGNESIUM
Magnesium: 1.8 mg/dL (ref 1.7–2.4)
Magnesium: 2.1 mg/dL (ref 1.7–2.4)

## 2015-11-25 LAB — GLUCOSE, CAPILLARY
GLUCOSE-CAPILLARY: 148 mg/dL — AB (ref 65–99)
GLUCOSE-CAPILLARY: 143 mg/dL — AB (ref 65–99)
Glucose-Capillary: 130 mg/dL — ABNORMAL HIGH (ref 65–99)
Glucose-Capillary: 144 mg/dL — ABNORMAL HIGH (ref 65–99)

## 2015-11-25 LAB — PROCALCITONIN: PROCALCITONIN: 4.95 ng/mL

## 2015-11-25 LAB — CBC
HCT: 46.4 % (ref 39.0–52.0)
Hemoglobin: 13.7 g/dL (ref 13.0–17.0)
MCH: 27.6 pg (ref 26.0–34.0)
MCHC: 29.5 g/dL — ABNORMAL LOW (ref 30.0–36.0)
MCV: 93.4 fL (ref 78.0–100.0)
PLATELETS: 189 10*3/uL (ref 150–400)
RBC: 4.97 MIL/uL (ref 4.22–5.81)
RDW: 14.5 % (ref 11.5–15.5)
WBC: 10.7 10*3/uL — AB (ref 4.0–10.5)

## 2015-11-25 LAB — CREATININE, SERUM
CREATININE: 1.12 mg/dL (ref 0.61–1.24)
GFR calc non Af Amer: >60 mL/min (ref >60)

## 2015-11-25 LAB — PHOSPHORUS
PHOSPHORUS: 2.7 mg/dL (ref 2.5–4.6)
Phosphorus: 3.2 mg/dL (ref 2.5–4.6)

## 2015-11-25 LAB — TRIGLYCERIDES: Triglycerides: 170 mg/dL — ABNORMAL HIGH (ref <150)

## 2015-11-25 LAB — LACTIC ACID, PLASMA: Lactic Acid, Venous: 2 mmol/L (ref 0.5–1.9)

## 2015-11-25 LAB — VANCOMYCIN, TROUGH: VANCOMYCIN TR: 8 ug/mL — AB (ref 15–20)

## 2015-11-25 MED ORDER — FENTANYL CITRATE (PF) 100 MCG/2ML IJ SOLN
50.0000 ug | Freq: Once | INTRAMUSCULAR | Status: AC
  Administered 2015-11-25: 50 ug via INTRAVENOUS

## 2015-11-25 MED ORDER — SODIUM CHLORIDE 0.9 % IV SOLN
Freq: Once | INTRAVENOUS | Status: AC
  Administered 2015-11-25: 05:00:00 via INTRAVENOUS

## 2015-11-25 MED ORDER — CHLORHEXIDINE GLUCONATE 0.12% ORAL RINSE (MEDLINE KIT)
15.0000 mL | Freq: Two times a day (BID) | OROMUCOSAL | Status: DC
  Administered 2015-11-25 – 2015-12-07 (×26): 15 mL via OROMUCOSAL

## 2015-11-25 MED ORDER — PRO-STAT SUGAR FREE PO LIQD: 30.0000 mL | Freq: Two times a day (BID) | ORAL | Status: DC

## 2015-11-25 MED ORDER — SODIUM CHLORIDE 0.9 % IJ SOLN
INTRAMUSCULAR | Status: AC
  Filled 2015-11-25: qty 10

## 2015-11-25 MED ORDER — SODIUM CHLORIDE 0.9 % IV SOLN
Freq: Once | INTRAVENOUS | Status: AC
  Administered 2015-11-25: 01:00:00 via INTRAVENOUS

## 2015-11-25 MED ORDER — SODIUM CHLORIDE 0.9 % IV SOLN
INTRAVENOUS | Status: DC
  Administered 2015-11-25: 06:00:00 via INTRAVENOUS

## 2015-11-25 MED ORDER — MIDAZOLAM HCL 2 MG/2ML IJ SOLN
2.0000 mg | INTRAMUSCULAR | Status: DC | PRN
  Administered 2015-11-25: 4 mg via INTRAVENOUS
  Administered 2015-11-25: 2 mg via INTRAVENOUS
  Filled 2015-11-25 (×4): qty 2

## 2015-11-25 MED ORDER — PANTOPRAZOLE SODIUM 40 MG PO PACK
40.0000 mg | PACK | ORAL | Status: DC
  Administered 2015-11-25 – 2015-12-02 (×8): 40 mg
  Filled 2015-11-25 (×8): qty 20

## 2015-11-25 MED ORDER — SUCCINYLCHOLINE CHLORIDE 20 MG/ML IJ SOLN
INTRAMUSCULAR | Status: DC | PRN
  Administered 2015-11-25: 100 mg via INTRAVENOUS

## 2015-11-25 MED ORDER — VITAL HIGH PROTEIN PO LIQD
1000.0000 mL | ORAL | Status: DC
  Filled 2015-11-25: qty 1000

## 2015-11-25 MED ORDER — VANCOMYCIN HCL IN DEXTROSE 1-5 GM/200ML-% IV SOLN
1000.0000 mg | Freq: Three times a day (TID) | INTRAVENOUS | Status: DC
  Administered 2015-11-25 – 2015-11-27 (×6): 1000 mg via INTRAVENOUS
  Filled 2015-11-25 (×7): qty 200

## 2015-11-25 MED ORDER — DOCUSATE SODIUM 50 MG/5ML PO LIQD
100.0000 mg | Freq: Two times a day (BID) | ORAL | Status: DC | PRN
  Administered 2015-11-28: 100 mg
  Filled 2015-11-25: qty 10

## 2015-11-25 MED ORDER — MIDAZOLAM HCL 2 MG/2ML IJ SOLN
INTRAMUSCULAR | Status: AC
  Administered 2015-11-25: 4 mg
  Filled 2015-11-25: qty 2

## 2015-11-25 MED ORDER — MIDAZOLAM HCL 2 MG/2ML IJ SOLN
2.0000 mg | INTRAMUSCULAR | Status: DC | PRN
  Administered 2015-11-25: 2 mg via INTRAVENOUS

## 2015-11-25 MED ORDER — ANTISEPTIC ORAL RINSE SOLUTION (CORINZ)
7.0000 mL | Freq: Four times a day (QID) | OROMUCOSAL | Status: DC
  Administered 2015-11-25 – 2015-12-07 (×50): 7 mL via OROMUCOSAL

## 2015-11-25 MED ORDER — INSULIN ASPART 100 UNIT/ML ~~LOC~~ SOLN
0.0000 [IU] | SUBCUTANEOUS | Status: DC
  Administered 2015-11-25 (×4): 3 [IU] via SUBCUTANEOUS
  Administered 2015-11-26 (×2): 4 [IU] via SUBCUTANEOUS
  Administered 2015-11-26 (×5): 3 [IU] via SUBCUTANEOUS
  Administered 2015-11-27: 4 [IU] via SUBCUTANEOUS
  Administered 2015-11-27 (×3): 3 [IU] via SUBCUTANEOUS
  Administered 2015-11-27: 4 [IU] via SUBCUTANEOUS
  Administered 2015-11-28: 3 [IU] via SUBCUTANEOUS
  Administered 2015-11-28 (×2): 4 [IU] via SUBCUTANEOUS
  Administered 2015-11-28 – 2015-11-29 (×3): 3 [IU] via SUBCUTANEOUS
  Administered 2015-11-29 (×2): 4 [IU] via SUBCUTANEOUS
  Administered 2015-11-29 – 2015-12-01 (×10): 3 [IU] via SUBCUTANEOUS
  Administered 2015-12-01: 4 [IU] via SUBCUTANEOUS
  Administered 2015-12-01 – 2015-12-02 (×3): 3 [IU] via SUBCUTANEOUS
  Administered 2015-12-02: 4 [IU] via SUBCUTANEOUS
  Administered 2015-12-03 (×3): 3 [IU] via SUBCUTANEOUS
  Administered 2015-12-03: 4 [IU] via SUBCUTANEOUS
  Administered 2015-12-03 – 2015-12-04 (×4): 3 [IU] via SUBCUTANEOUS
  Administered 2015-12-04 (×2): 4 [IU] via SUBCUTANEOUS
  Administered 2015-12-05 – 2015-12-06 (×11): 3 [IU] via SUBCUTANEOUS
  Administered 2015-12-07: 4 [IU] via SUBCUTANEOUS
  Administered 2015-12-07: 3 [IU] via SUBCUTANEOUS
  Administered 2015-12-07: 8 [IU] via SUBCUTANEOUS
  Administered 2015-12-07: 7 [IU] via SUBCUTANEOUS

## 2015-11-25 MED ORDER — FENTANYL BOLUS VIA INFUSION
50.0000 ug | INTRAVENOUS | Status: DC | PRN
  Filled 2015-11-25: qty 50

## 2015-11-25 MED ORDER — SODIUM CHLORIDE 0.9 % IV SOLN
25.0000 ug/h | INTRAVENOUS | Status: DC
  Administered 2015-11-25: 50 ug/h via INTRAVENOUS
  Administered 2015-11-26: 175 ug/h via INTRAVENOUS
  Administered 2015-11-26: 150 ug/h via INTRAVENOUS
  Administered 2015-11-27 – 2015-11-28 (×3): 200 ug/h via INTRAVENOUS
  Administered 2015-11-29: 225 ug/h via INTRAVENOUS
  Administered 2015-11-29: 200 ug/h via INTRAVENOUS
  Administered 2015-11-30 (×3): 225 ug/h via INTRAVENOUS
  Administered 2015-12-01 – 2015-12-02 (×2): 150 ug/h via INTRAVENOUS
  Administered 2015-12-02: 25 ug/h via INTRAVENOUS
  Administered 2015-12-03: 200 ug/h via INTRAVENOUS
  Filled 2015-11-25 (×17): qty 50

## 2015-11-25 MED ORDER — PHENYLEPHRINE HCL 10 MG/ML IJ SOLN
30.0000 ug/min | INTRAVENOUS | Status: DC
  Administered 2015-11-25: 45 ug/min via INTRAVENOUS
  Administered 2015-11-25: 95 ug/min via INTRAVENOUS
  Administered 2015-11-25: 125 ug/min via INTRAVENOUS
  Administered 2015-11-25: 40 ug/min via INTRAVENOUS
  Filled 2015-11-25 (×7): qty 1

## 2015-11-25 MED ORDER — VITAL HIGH PROTEIN PO LIQD
1000.0000 mL | ORAL | Status: DC
  Administered 2015-11-25 – 2015-11-26 (×2): 1000 mL
  Filled 2015-11-25 (×2): qty 1000

## 2015-11-25 MED ORDER — FENTANYL CITRATE (PF) 100 MCG/2ML IJ SOLN
INTRAMUSCULAR | Status: AC
  Administered 2015-11-25: 100 ug
  Filled 2015-11-25: qty 4

## 2015-11-25 MED ORDER — ARTIFICIAL TEARS OP OINT
TOPICAL_OINTMENT | Freq: Three times a day (TID) | OPHTHALMIC | Status: DC
  Administered 2015-11-25 – 2015-11-26 (×3): via OPHTHALMIC
  Administered 2015-11-26: 1 via OPHTHALMIC
  Administered 2015-11-26: 23:00:00 via OPHTHALMIC
  Administered 2015-11-27: 1 via OPHTHALMIC
  Administered 2015-11-27 – 2015-12-01 (×11): via OPHTHALMIC
  Administered 2015-12-01: 1 via OPHTHALMIC
  Filled 2015-11-25 (×2): qty 3.5

## 2015-11-25 MED ORDER — PRO-STAT SUGAR FREE PO LIQD
60.0000 mL | Freq: Four times a day (QID) | ORAL | Status: DC
  Administered 2015-11-25 – 2015-11-26 (×3): 60 mL
  Filled 2015-11-25 (×4): qty 60

## 2015-11-25 NOTE — Progress Notes (Signed)
eLink Physician-Brief Progress Note Patient Name: Kenneth Mcfarland DOB: October 30, 1980 MRN: [id number]   Date of Service  11/25/2015  HPI/Events of Note  F/u up on pt. S/P intubation for hypercapneic and hypoxemic resp fx. Concern for HCAP. Also has cellulitis.   BP has been < 90 sys since intubated. Already received bolus 500 mls.Prior to intubation, BP was 110-120 sys.  Sedated on propofol but starting to wake up and be agitated.   eICU Interventions  Plan : 1. Will bolus another 500 mls IVF. 2. Cont propofol for now > may eventually need to switch over to versed pushes. Will start versed pushes now as well. ? If propofol causes drop in bp 3. Will d/c BP meds. 4. Will d/c metformin.  5. Check lactate. 6. abg is still pending.  7. Cont neosynephrine drip for now at low dose.  If neo ends up being a high dose, pt will need a CVL and will switch pressors to levophed.  8. D/w RN at bedside.      Intervention Category Major Interventions: Other:  Daneen Schick Dios 11/25/2015, 12:57 AM

## 2015-11-25 NOTE — Progress Notes (Signed)
CRITICAL VALUE ALERT  Critical value received:  Lactic Acid: 2.0  Date of notification:  11/25/15  Time of notification: 0358  Critical value read back:Yes.    Nurse who received alert:  Porfirio Oar RN  MD notified (1st page): Pola Corn MD  Time of first page:  0413  Responding MD:  Christene Slates  Time MD responded:  [phone]

## 2015-11-25 NOTE — Progress Notes (Signed)
eLink Physician-Brief Progress Note Patient Name: Kenneth Mcfarland DOB: [birth year] MRN: [id number]   Date of Service  11/25/2015  HPI/Events of Note  Oxygenation OK, was initially low post re-intubation  eICU Interventions  Wean PEEP, order written for 10, maintain SaO2 > 90%     Intervention Category Major Interventions: Respiratory failure - evaluation and management  Max Fickle 11/25/2015, 3:25 PM

## 2015-11-25 NOTE — Anesthesia Procedure Notes (Signed)
Procedure Name: Intubation Date/Time: 11/25/2015 8:40 AM Performed by: Orest Dikes Pre-anesthesia Checklist: Patient identified, Emergency Drugs available, Suction available and Patient being monitored Patient Re-evaluated:Patient Re-evaluated prior to inductionOxygen Delivery Method: Circle system utilized Preoxygenation: Pre-oxygenation with 100% oxygen Intubation Type: IV induction Ventilation: Mask ventilation without difficulty and Oral airway inserted - appropriate to patient size Laryngoscope Size: Glidescope and 4 Grade View: Grade II Tube type: Subglottic suction tube Tube size: 8.0 mm Number of attempts: 1 Airway Equipment and Method: Stylet and Oral airway Placement Confirmation: ETT inserted through vocal cords under direct vision,  positive ETCO2 and breath sounds checked- equal and bilateral Secured at: 24 cm Tube secured with: Tape Dental Injury: Teeth and Oropharynx as per pre-operative assessment  Difficulty Due To: Difficulty was anticipated, Difficult Airway- due to large tongue and Difficult Airway- due to reduced neck mobility Future Recommendations: Recommend- induction with short-acting agent, and alternative techniques readily available

## 2015-11-25 NOTE — Progress Notes (Signed)
RT called to bedside of patient who self-extubated. Patient had decreased sats that fell into the 50's. Patient was ventilated by BVM w/ PEEP valve; Improvement in sats noted. Patient was reintubated by anesthesia w/ 8.0 ETT. Patient returned to previous settings as listed in flowsheet. Patient is currently comfortable with no distress noted. Discussed weaning PEEP/FiO2 as tolerated with MD. RT will continue to monitor patient and reduced FiO2 and PEEP as tolerated.

## 2015-11-25 NOTE — Progress Notes (Signed)
eLink Physician-Brief Progress Note Patient Name: Kenneth Mcfarland DOB: [birth year] MRN: [id number]   Date of Service  11/25/2015  HPI/Events of Note  BP better at 119/54, MAP 75, 86, 20.  Getting agitated on versed and fentanyl pushes. Cutting down on neo drip > now at 85. On going fluid bolus  eICU Interventions  Will switch fentanyl pushes to drip. May need versed drip as well.  After bolus with IVF, will start maintenance IVF     Intervention Category Intermediate Interventions: Other:  Louann Sjogren 11/25/2015, 5:33 AM

## 2015-11-25 NOTE — Progress Notes (Signed)
PULMONARY / CRITICAL CARE MEDICINE   Name: Kenneth Mcfarland MRN: [id number] DOB: Apr 20, 1981    ADMISSION DATE:  11/06/2015 CONSULTATION DATE:  11/24/2015  REFERRING MD:  Dr. Robb Matar, [facility]  CHIEF COMPLAINT:  Short of breath  SUBJECTIVE:  Intubated overnight.  On low dose pressors.  VITAL SIGNS: BP 130/66   Pulse 86   Temp 99.1 F (37.3 C)   Resp 20   Ht 5\' 9"  (1.753 m)   Wt (!) 324 lb (147 kg)   SpO2 95%   BMI 47.85 kg/m   HEMODYNAMICS:    VENTILATOR SETTINGS: Vent Mode: PRVC FiO2 (%):  [50 %-100 %] 50 % Set Rate:  [20 bmp] 20 bmp Vt Set:  [520 mL] 520 mL PEEP:  [5 cmH20-10 cmH20] 5 cmH20 Plateau Pressure:  [20 cmH20-28 cmH20] 20 cmH20  INTAKE / OUTPUT: I/O last 3 completed shifts: In: 3140.2 [I.V.:2240.2; IV Piggyback:900] Out: 3625 [Urine:2900; Emesis/NG output:475; Other:250]  PHYSICAL EXAMINATION: General: sedated Neuro:  RASS -3 HEENT:  Sclera injected, ETT in place Cardiovascular:  Regular Lungs:  B/l crackles, no wheeze Abdomen:  Obese, soft, non tender Musculoskeletal:  3+ edema Skin:  Excoriation Lt leg  LABS:  BMET  Recent Labs Lab 11/23/15 0353 11/24/15 0310 11/25/15 0315  NA 135  135 136 137  K 3.9  3.9 5.0 5.0  CL 101  100* 97* 97*  CO2 29  30 32 34*  BUN 15  14 14  22*  CREATININE 1.21  1.20 1.15 1.58*  GLUCOSE 129*  128* 138* 145*    Electrolytes  Recent Labs Lab 11/23/15 0353 11/24/15 0310 11/25/15 0315  CALCIUM 8.6*  8.6* 8.9 8.7*  PHOS 3.4  --   --     CBC  Recent Labs Lab 11/23/15 0353 11/24/15 0310 11/25/15 0315  WBC 23.9* 15.9* 10.7*  HGB 14.7 14.8 13.7  HCT 46.6 49.2 46.4  PLT 178 178 189    Coag's No results for input(s): APTT, INR in the last 168 hours.  Sepsis Markers  Recent Labs Lab 11/13/2015 0349 11/25/15 0315  LATICACIDVEN 1.48 2.0*  PROCALCITON  --  4.95    ABG  Recent Labs Lab 11/21/2015 0657 11/24/15 2040 11/25/15 0130  PHART 7.315* 7.267* 7.396  PCO2ART 61.4* 86.9* 55.0*   PO2ART 281* 125* 235*    Liver Enzymes  Recent Labs Lab 11/01/2015 0340 11/23/15 0353  AST 24  --   ALT 19  --   ALKPHOS 78  --   BILITOT 0.6  --   ALBUMIN 3.5 2.9*    Cardiac Enzymes No results for input(s): TROPONINI, PROBNP in the last 168 hours.  Glucose  Recent Labs Lab 11/23/15 2303 11/24/15 0749 11/24/15 1128 11/24/15 1637 11/24/15 1936 11/24/15 2129  GLUCAP 138* 168* 146* 135* 140* 141*    Imaging Dg Chest Port 1 View  Result Date: 11/24/2015 CLINICAL DATA:  35 year old male status post ET tube placement. EXAM: PORTABLE CHEST 1 VIEW COMPARISON:  Chest radiograph dated 11/24/2015 FINDINGS: There has been interval placement of an endotracheal tube the tip approximately 5 cm above the carina. An enteric tube courses into the left hemi abdomen with tip beyond the inferior margin of the image. Left lung base opacities as well as an ill-defined hazy area of density abutting the aortic arch may represent atelectatic changes versus infiltrate. The right lung is clear. no significant pleural effusion. No pneumothorax. Stable top-normal cardiac size. No acute osseous pathology. IMPRESSION: Interval placement of an endotracheal tube with tip above the  carina. Left lung base and left upper lobe atelectatic changes versus infiltrate. Follow-up recommended. Electronically Signed   By: Elgie Collard M.D.   On: 11/24/2015 22:54   Dg Chest Port 1 View  Result Date: 11/24/2015 CLINICAL DATA:  Pt is morbidly obese. In respiratory distress. Pt could not be sat up more from current position. Pt's eyes are swollen, very red, and were rolled up into top of head. Pt has severe infection/fluid collection in left leg. Hx of asthma and diabetes. EXAM: PORTABLE CHEST 1 VIEW COMPARISON:  Dec 15, 2015 FINDINGS: A Celsius airspace opacity projects within the left mid to upper lung peripherally. Right lung is clear. No convincing pleural effusion and no pneumothorax. The cardiac silhouette is normal in  size configuration. No mediastinal or hilar masses. Bony thorax is unremarkable. IMPRESSION: Left mid to upper lung consolidation consistent with pneumonia. Electronically Signed   By: Amie Portland M.D.   On: 11/24/2015 09:39     STUDIES:  PSG 03/03/15 >> AHI 108, SpO2 low 60%.  CPAP 16 >> AHI 0 Doppler Lt leg 11/23/15 >> negative for DVT, limited study Echo 11/24/15 >> mod LVH, EF 65 to 70%, mod RV dilation, mod TR, PAS 85 mmHg  CULTURES: 7/30 Blood >>  8/01 Sputum >>  ANTIBIOTICS: 7/29 Aztreonam >> 7/30 7/29 Flagyl >> 7/30 7/29 Vancomycin >> 7/31 Rocephin >> 8/01 8/01 Cefepime >>   SIGNIFICANT EVENTS: 7/29 Admit 8/01 VDRF  LINES/TUBES: 8/01 ETT >>  DISCUSSION: 35 yo male admitted with sepsis from Lt leg cellulitis.  Has hx of tobacco abuse and severe obstructive sleep apnea.  He has been non compliant with CPAP as outpt.  He has previous ABG showing hypoxia/hypercapnia, and this is most likely related to obesity hypoventilation syndrome.  He has previously elevated IgE.  He has progressive respiratory distress with hypoxia, productive cough, and edema.  CXR shows changes of PNA.  ASSESSMENT / PLAN:  PULMONARY A: Acute on chronic hypoxic, hypercapnic respiratory failure from HCAP, OSA/OHS, possible obstructive airways disease, presumed diastolic CHF. Tobacco abuse. P: Full vent support F/u CXR, ABG Schedule BDs Nicotine patch  CARDIAC A: Acute cor pulmonale. Acute on chronic diastolic CHF. Hypotension related to sepsis, and sedation medicines. P: Even fluid balance Wean pressors to keep MAP > 65 Defer CVL for now Hold outpt lisinopril, lasix  RENAL A: AKI >> baseline creatinine 1.1 P: Monitor renal fx, urine outpt  GASTROENTEROLOGY A: Morbid obesity. P: Tube feeds while on vent Protonix for SUP  HEMATOLOGY A: Leukocytosis. P: F/u CBC Lovenox for DVT prophylaxis  INFECTION A: Sepsis with HCAP and Lt leg cellulitis. P: Continue vancomycin,  cefepime F/u procalcitonin  ENDOCRINE A: DM with neuropathy. P: SSI Hold outpt glucotrol, glucophage  NEUROLOGY A: Acute metabolic encephalopathy. DM neuropathy. P: RASS goal -1 Hold outpt xanax, neurontin, oxycodone   CC time 34 minutes.  Coralyn Helling, MD [facility] 11/25/2015, 7:34 AM Pager:  [phone] After 3pm call: [phone]

## 2015-11-25 NOTE — Progress Notes (Signed)
eLink Physician-Brief Progress Note Patient Name: Kenneth Mcfarland DOB: 1981/01/05 MRN: [id number]   Date of Service  11/25/2015  HPI/Events of Note  BP still low despite fluid bolus. On propofol now but starting to be agitated.   eICU Interventions  Will try neosynephrinr peripherally at a small dose.   Told RN to call if pt will need higher dose of neo > will need CVL.          Braison Snoke Bridgette Habermann Dios 11/25/2015, 12:23 AM

## 2015-11-25 NOTE — Progress Notes (Signed)
Initial Nutrition Assessment  DOCUMENTATION CODES:   Morbid obesity  INTERVENTION:  -RD to order Vital High Protein @ 48mL/hr via OGT -Pro-Stat 35mL x4 -With current propofol rate of 8.67mL/hr, provides 2232 calories, 225gm protein, and free water  NUTRITION DIAGNOSIS:   Inadequate oral intake related to inability to eat as evidenced by NPO status.  GOAL:   Provide needs based on ASPEN/SCCM guidelines  MONITOR:   I & O's, TF tolerance, Vent status, Labs, Weight trends  REASON FOR ASSESSMENT:   Ventilator    ASSESSMENT:   This is a 35 year old gentleman with multiple medical issues including diabetes. He states he woke up after midnight tonight and had pain in his left lower extremity. He came to the ER. He denies any recent trauma, stating the scarring on the left lower extremity are old scars.  Patient is currently intubated on ventilator support MV: 9.8 L/min Temp (24hrs), Avg:100 F (37.8 C), Min:99.1 F (37.3 C), Max:100.9 F (38.3 C)  Propofol: 8.8 ml/hr  No family at bedside Presents with Sepis from Left Leg Cellulitis. Severe OSA. Non compliant with CPAP as outpt.  Self extubated this morning with Sats into the 50s, reintubated Morbid obesity.  Labs and Medications reviewed: Fentanyl drip, Versed PRN NS @ 31mL/hr  Diet Order:  Diet NPO time specified  Skin:  Reviewed, no issues (Cellulitis to Leg & Abdomen)  Last BM:  7/29  Height:   Ht Readings from Last 1 Encounters:  11/05/2015 5\' 9"  (1.753 m)    Weight:   Wt Readings from Last 1 Encounters:  11/23/15 (!) 324 lb (147 kg)    Ideal Body Weight:  72.72 kg  BMI:  Body mass index is 47.85 kg/m.  Estimated Nutritional Needs:   Kcal:  0258-5277  Protein:  230gm  Fluid:  >/= 1.6L  EDUCATION NEEDS:   No education needs identified at this time  Dionne Ano. [place] Festa, MS, RD LDN Inpatient Clinical Dietitian Pager [phone]

## 2015-11-25 NOTE — Progress Notes (Signed)
Pt self extubated.  SpO2 dropped into 50's.  Very difficult airway >> eventually reintubated by anesthesia.  Likely derecruited >> will increase PEEP to 16 and FiO2 to 100%.  F/u CXR.  Updated pt's girlfriend at bedside.    Additional CC time 46 minutes.  Coralyn Helling, MD [facility] Pulmonary/Critical Care 11/25/2015, 8:48 AM Pager:  [phone] After 3pm call: [phone]

## 2015-11-25 NOTE — Progress Notes (Signed)
Pharmacy: Re- vancomycin  Kenneth Mcfarland is a 35 y.o. male with diabetes admitted on 11/28/15 with cellulitis after recently cuttling left leg. Pharmacy has been consulted for vancomycin and cefepime dosing as patient has now likely developed HAP.  Last vancomycin trough drawn on 8/2 was subtherapeutic at 8 with dose changed to 1250 mg IV q8h (from q12h frequency).  However, with scr increased from 1.15 to 1.58 this morning we checked level this afternoon to assess clearance.  Level drawn ~16 hrs after last dose now back subtherapeutic at 8 (goal15-20) with scr down to 1.12 this evening.  Plan: - change vancomycin to 1 gm IV q8h - f/u with bmp and UOP in AM  Dorna Leitz, PharmD, BCPS 11/25/2015 6:15 PM

## 2015-11-25 NOTE — Progress Notes (Signed)
Pharmacy Antibiotic Note  Kenneth Mcfarland is a 35 y.o. male with diabetes admitted on 11/02/2015 with cellulitis after recently cuttling left leg. Pharmacy has been consulted for vancomycin and cefepime dosing as patient has now likely developed HAP.  Today, 11/25/2015 - SCr has increased overnight, 1.15 > 1.58 (baseline 1.1), maintaining UOP - Febrile 100, PCT and lactate elevated - WBC elevated but improved to 10.7 - Cultures negative to date, sputum pending  Plan:  Hold vancomycin, today's morning dose not yet given per RN  Check vancomycin level and SCr at 14:00 which is 12hr after most recent dose to r/o toxicity; will resume reduced dose if level > 20 mcg/ml  Follow up culture data and narrow abx spectrum as appropriate. CCM not ready to do so today given events of this AM.  Height: 5\' 9"  (175.3 cm) Weight: (!) 324 lb (147 kg) IBW/kg (Calculated) : 70.7  Temp (24hrs), Avg:99.9 F (37.7 C), Min:98.6 F (37 C), Max:100.9 F (38.3 C)   Recent Labs Lab 11/11/2015 0340 11/11/2015 0349 11/23/15 0353 11/24/15 0310 11/24/15 1557 11/25/15 0315  WBC 28.1*  --  23.9* 15.9*  --  10.7*  CREATININE 1.40*  --  1.21  1.20 1.15  --  1.58*  LATICACIDVEN  --  1.48  --   --   --  2.0*  VANCOTROUGH  --   --   --   --  8*  --     Estimated Creatinine Clearance: 92.5 mL/min (by C-G formula based on SCr of 1.58 mg/dL).    Allergies  Allergen Reactions  . Bee Venom Anaphylaxis  . Contrast Media [Iodinated Diagnostic Agents] Nausea And Vomiting       . Wasp Venom Anaphylaxis  . Cherry Other (See Comments)    unknown  . Penicillins Other (See Comments)    Childhood allergic reaction - no other information available  . Vicodin [Hydrocodone-Acetaminophen] Diarrhea    Antimicrobials this admission: 7/30 Flagyl >> 7/31 7/30 Aztreonam >> 7/31 7/31 Rocephin >> 8/1 7/30 Vanc >>   8/1 Cefepime  >>   Dose adjustments/Drug levels this admission: 8/1 VT at 1600 = 8  on 1250mg  q12 - increase  1250mg  q8h 8/2 SCr increased - hold vanc, check trough at 1400 (12hr post dose) = ___    Microbiology results: 7/30 BCx:  ngtd 7/30 UCx:   multiple, suggest recollect 7/30 MRSA PCR: negative 8/1 sputum: sent  Thank you for allowing pharmacy to be a part of this patient's care.  Loralee Pacas, PharmD, BCPS Pager: [phone] 11/25/2015 9:53 AM

## 2015-11-25 NOTE — Progress Notes (Signed)
eLink Physician-Brief Progress Note Patient Name: Kenneth Mcfarland DOB: 1980/11/04 MRN: [id number]   Date of Service  11/25/2015  HPI/Events of Note  Propofol has been dcd. Getting versed and fentanyl pushes.  115/47,  83,  20,  95% I and O since admission is (-) 1.7 L I had bolused pt with 1L NS since intubated. Still on neo at 110 mcg/kg/min Lactate 2 Pt with pulm htn  eICU Interventions  I think pt still needs volume resuscitation. He is not in distress. Will bolus with 1L NS and assess after.  Cont to wean off neo drip. Cont versed and fentanyl pushes,      Intervention Category Major Interventions: Other:  Daneen Schick Dios 11/25/2015, 4:23 AM

## 2015-11-26 LAB — BASIC METABOLIC PANEL
Anion gap: 6 (ref 5–15)
BUN: 26 mg/dL — ABNORMAL HIGH (ref 6–20)
CALCIUM: 8.5 mg/dL — AB (ref 8.9–10.3)
CHLORIDE: 98 mmol/L — AB (ref 101–111)
CO2: 37 mmol/L — AB (ref 22–32)
CREATININE: 0.98 mg/dL (ref 0.61–1.24)
GFR calc non Af Amer: >60 mL/min (ref >60)
GLUCOSE: 150 mg/dL — AB (ref 65–99)
Potassium: 4.5 mmol/L (ref 3.5–5.1)
Sodium: 141 mmol/L (ref 135–145)

## 2015-11-26 LAB — BLOOD GAS, ARTERIAL
ACID-BASE EXCESS: 11.3 mmol/L — AB (ref 0.0–2.0)
Bicarbonate: 39.4 mEq/L — ABNORMAL HIGH (ref 20.0–24.0)
DRAWN BY: 308601
FIO2: 0.5
MECHVT: 520 mL
O2 SAT: 94.8 %
PEEP/CPAP: 10 cmH2O
PH ART: 7.348 — AB (ref 7.350–7.450)
PO2 ART: 89.5 mmHg (ref 80.0–100.0)
Patient temperature: 38.2
RATE: 20 resp/min
TCO2: 35.2 mmol/L (ref 0–100)
pCO2 arterial: 74.9 mmHg (ref 35.0–45.0)

## 2015-11-26 LAB — CBC
HCT: 44.4 % (ref 39.0–52.0)
HEMOGLOBIN: 13.1 g/dL (ref 13.0–17.0)
MCH: 27.3 pg (ref 26.0–34.0)
MCHC: 29.5 g/dL — AB (ref 30.0–36.0)
MCV: 92.7 fL (ref 78.0–100.0)
PLATELETS: 217 10*3/uL (ref 150–400)
RBC: 4.79 MIL/uL (ref 4.22–5.81)
RDW: 14.6 % (ref 11.5–15.5)
WBC: 9.6 10*3/uL (ref 4.0–10.5)

## 2015-11-26 LAB — MAGNESIUM
MAGNESIUM: 1.9 mg/dL (ref 1.7–2.4)
Magnesium: 2 mg/dL (ref 1.7–2.4)

## 2015-11-26 LAB — GLUCOSE, CAPILLARY
GLUCOSE-CAPILLARY: 139 mg/dL — AB (ref 65–99)
GLUCOSE-CAPILLARY: 148 mg/dL — AB (ref 65–99)
GLUCOSE-CAPILLARY: 156 mg/dL — AB (ref 65–99)
Glucose-Capillary: 133 mg/dL — ABNORMAL HIGH (ref 65–99)
Glucose-Capillary: 140 mg/dL — ABNORMAL HIGH (ref 65–99)
Glucose-Capillary: 142 mg/dL — ABNORMAL HIGH (ref 65–99)
Glucose-Capillary: 160 mg/dL — ABNORMAL HIGH (ref 65–99)

## 2015-11-26 LAB — PHOSPHORUS
PHOSPHORUS: 3.5 mg/dL (ref 2.5–4.6)
Phosphorus: 4.4 mg/dL (ref 2.5–4.6)

## 2015-11-26 LAB — PROCALCITONIN: Procalcitonin: 2.12 ng/mL

## 2015-11-26 MED ORDER — VITAL HIGH PROTEIN PO LIQD
1000.0000 mL | ORAL | Status: DC
  Administered 2015-11-26 – 2015-11-27 (×2): 1000 mL
  Filled 2015-11-26 (×3): qty 1000

## 2015-11-26 MED ORDER — PRO-STAT SUGAR FREE PO LIQD
30.0000 mL | Freq: Two times a day (BID) | ORAL | Status: DC
  Administered 2015-11-26 – 2015-11-27 (×2): 30 mL
  Filled 2015-11-26 (×3): qty 30

## 2015-11-26 NOTE — Progress Notes (Signed)
eLink Physician-Brief Progress Note Patient Name: Kenneth Mcfarland DOB: 18-Mar-1981 MRN: [id number]   Date of Service  11/26/2015  HPI/Events of Note  Pulling at tubes/lines, danger to self  eICU Interventions  Renew restraint order     Intervention Category Minor Interventions: Agitation / anxiety - evaluation and management  Max Fickle 11/26/2015, 7:57 PM

## 2015-11-26 NOTE — Progress Notes (Signed)
Pt mother, Maruice Mitman, called regarding update on pt status. Pt mother requesting call from MD tomorrow, 11/27/15, following morning rounds. Contact information: [address]le[address] [place] [phone]. RN confirmed contact information.

## 2015-11-26 NOTE — Progress Notes (Signed)
Nutrition Follow-up  DOCUMENTATION CODES:   Morbid obesity  INTERVENTION:  - Will change TF regimen Vital High Protein @ 60 mL/hr with 30 mL Prostat BID. This regimen + kcal from current Propofol rate will provide 1920 kcal, 150 grams of protein, and 1204 mL free water.  - Continue PEPUP protocol. - Will monitor for need for free water flush. - RD will follow-up 8/4  NUTRITION DIAGNOSIS:   Inadequate oral intake related to inability to eat as evidenced by NPO status. -ongoing  GOAL:   Provide needs based on ASPEN/SCCM guidelines -met with current TF regimen.  MONITOR:   Vent status, TF tolerance, Weight trends, Labs, Skin, I & O's  ASSESSMENT:   This is a 35 year old gentleman with multiple medical issues including diabetes. He states he woke up after midnight tonight and had pain in his left lower extremity. He came to the ER. He denies any recent trauma, stating the scarring on the left lower extremity are old scars.  8/3 Pt with NGT in place. No family/visitors at bedside. No new weight sincce 7/31. Estimated protein needs updated this AM based on ASPEC/CCM guidelines for morbid obesity and based on current medical dx/medical course.   Patient is currently intubated on ventilator support MV: 9.6 L/min Temp (24hrs), Avg:100.3 F (37.9 C), Min:99.3 F (37.4 C), Max:100.6 F (38.1 C) Propofol: 10.6 ml/hr (280 kcal).  TF regimen adjusted as outlined above given current Propofol rate and updated estimated protein needs.   Medications reviewed; sliding scale Novolog, PRN Zofran, 40 mg Protonix/day,  Labs reviewed; CBGs: 140 and 142 mg/dL, Cl: 98 mg/dL, BUN: 26 mg/dL, Ca: 8.5 mg/dL.  IVF: NS @ 50 mL/hr.  Drips: Propofol @ 12 mcg/min, Fentanyl @ 150 mcg/hr.     8/2 Patient is currently intubated on ventilator support MV: 9.8 L/min Temp (24hrs), Avg:100 F (37.8 C), Max:100.9 F (38.3 C) Propofol: 8.8 ml/hr (232 kcal).  - No family at bedside - Presents with  Sepis from Left Leg Cellulitis.  - Severe OSA. Non compliant with CPAP as outpt.  - Self extubated this morning with Sats into the 50s, reintubated - Morbid obesity.   Diet Order:  Diet NPO time specified  Skin:  Reviewed, no issues (L leg cellulitis, abdominal abrasion)  Last BM:  PTA  Height:   Ht Readings from Last 1 Encounters:  11/03/2015 '5\' 9"'  (1.753 m)    Weight:   Wt Readings from Last 1 Encounters:  11/23/15 (!) 324 lb (147 kg)    Ideal Body Weight:  72.72 kg  BMI:  Body mass index is 47.85 kg/m.  Estimated Nutritional Needs:   Kcal:  5188-4166  Protein:  145-160 grams (2-2.2 grams/kg IBW)  Fluid:  >/= 1.6L  EDUCATION NEEDS:   No education needs identified at this time    Jarome Matin, MS, RD, LDN Inpatient Clinical Dietitian Pager # [phone] After hours/weekend pager # [phone]

## 2015-11-26 NOTE — Progress Notes (Signed)
PULMONARY / CRITICAL CARE MEDICINE   Name: Kenneth Mcfarland MRN: [id number] DOB: [birth year]    ADMISSION DATE:  2015/12/14 CONSULTATION DATE:  11/24/2015  REFERRING MD:  Dr. Robb Matar, [facility]  CHIEF COMPLAINT:  Short of breath  SUBJECTIVE:  Intubated overnight.  On low dose pressors.  VITAL SIGNS: BP (!) 152/68 (BP Location: Right Arm)   Pulse (!) 103   Temp (!) 100.6 F (38.1 C) (Core (Comment))   Resp 18   Ht 5\' 9"  (1.753 m)   Wt (!) 324 lb (147 kg)   SpO2 94%   BMI 47.85 kg/m   HEMODYNAMICS:    VENTILATOR SETTINGS: Vent Mode: PRVC FiO2 (%):  [50 %-100 %] 50 % Set Rate:  [16 bmp-20 bmp] 16 bmp Vt Set:  [520 mL] 520 mL PEEP:  [10 cmH20-16 cmH20] 10 cmH20 Plateau Pressure:  [25 cmH20-31 cmH20] 25 cmH20  INTAKE / OUTPUT: I/O last 3 completed shifts: In: 5087.2 [I.V.:3560.6; NG/GT:626.7; IV Piggyback:900] Out: 4380 [Urine:3655; Emesis/NG output:475; Other:250]  PHYSICAL EXAMINATION: General: sedated on vent Neuro:  RASS -3 HEENT:  Sclera injected, ETT in place Cardiovascular:  Regular Lungs:  B/l crackles, right wheeze Abdomen:  Obese, soft, non tender Musculoskeletal:  3+ edema Skin:  Excoriation Lt leg  LABS:  BMET  Recent Labs Lab 11/24/15 0310 11/25/15 0315 11/25/15 1722 11/26/15 0342  NA 136 137  --  141  K 5.0 5.0  --  4.5  CL 97* 97*  --  98*  CO2 32 34*  --  37*  BUN 14 22*  --  26*  CREATININE 1.15 1.58* 1.12 0.98  GLUCOSE 138* 145*  --  150*    Electrolytes  Recent Labs Lab 11/24/15 0310 11/25/15 0315 11/25/15 1722 11/26/15 0342  CALCIUM 8.9 8.7*  --  8.5*  MG  --  1.8 2.1 1.9  PHOS  --  2.7 3.2 3.5    CBC  Recent Labs Lab 11/24/15 0310 11/25/15 0315 11/26/15 0342  WBC 15.9* 10.7* 9.6  HGB 14.8 13.7 13.1  HCT 49.2 46.4 44.4  PLT 178 189 217    Coag's No results for input(s): APTT, INR in the last 168 hours.  Sepsis Markers  Recent Labs Lab 2015-12-14 0349 11/25/15 0315 11/26/15 0342  LATICACIDVEN 1.48 2.0*  --    PROCALCITON  --  4.95 2.12    ABG  Recent Labs Lab 11/24/15 2040 11/25/15 0130 11/26/15 0435  PHART 7.267* 7.396 7.348*  PCO2ART 86.9* 55.0* 74.9*  PO2ART 125* 235* 89.5    Liver Enzymes  Recent Labs Lab Dec 14, 2015 0340 11/23/15 0353  AST 24  --   ALT 19  --   ALKPHOS 78  --   BILITOT 0.6  --   ALBUMIN 3.5 2.9*    Cardiac Enzymes No results for input(s): TROPONINI, PROBNP in the last 168 hours.  Glucose  Recent Labs Lab 11/25/15 1002 11/25/15 1208 11/25/15 1539 11/25/15 1942 11/26/15 0006 11/26/15 0352  GLUCAP 148* 143* 130* 144* 140* 142*    Imaging Dg Abd 1 View  Result Date: 11/25/2015 CLINICAL DATA:  NG tube placement. EXAM: ABDOMEN - 1 VIEW COMPARISON:  Chest x-ray from today as well as lumbar spine 05/03/2014. FINDINGS: Examination includes 2 images with the second image demonstrating patient's nasogastric tube with tip and side-port over the stomach in the left upper quadrant. Bowel gas pattern is nonobstructive. Remainder of the exam is unchanged. IMPRESSION: Nonobstructive bowel gas pattern. Nasogastric tube with tip and side-port over the stomach in the  left upper quadrant. Electronically Signed   By: Elberta Fortis M.D.   On: 11/25/2015 09:57   Dg Chest Port 1 View  Result Date: 11/25/2015 CLINICAL DATA:  ET tube repositioned. EXAM: PORTABLE CHEST 1 VIEW COMPARISON:  Portable film earlier today. FINDINGS: ETT tip now 5.5 cm above carina. Enteric tube tip is in the stomach, but could be advanced as the last side hole is at the GE junction. There is LEFT lower lobe pneumonia. Vascular congestion is noted. LEFT pleural effusion. IMPRESSION: ETT repositioning, now 5.5 cm above carina. Stable LEFT lower lobe pneumonia. Consider advancement of enteric tube. Electronically Signed   By: Elsie Stain M.D.   On: 11/25/2015 09:56   Dg Chest Port 1 View  Result Date: 11/25/2015 CLINICAL DATA:  Respiratory failure, check endotracheal tube EXAM: PORTABLE CHEST 1 VIEW  COMPARISON:  11/24/2015 FINDINGS: Endotracheal tube and nasogastric catheter are again seen and stable. Increased central vascular congestion is noted bilaterally. Some air bronchograms are noted in the left base likely related to early infiltrate. Increasing density is noted on the left likely related to posteriorly layering effusion. IMPRESSION: Left basilar infiltrate and pleural effusion. Increased vascular congestion when compared with the prior exam. Electronically Signed   By: Alcide Clever M.D.   On: 11/25/2015 08:11   Dg Chest Port 1 View  Result Date: 11/24/2015 CLINICAL DATA:  35 year old male status post ET tube placement. EXAM: PORTABLE CHEST 1 VIEW COMPARISON:  Chest radiograph dated 11/24/2015 FINDINGS: There has been interval placement of an endotracheal tube the tip approximately 5 cm above the carina. An enteric tube courses into the left hemi abdomen with tip beyond the inferior margin of the image. Left lung base opacities as well as an ill-defined hazy area of density abutting the aortic arch may represent atelectatic changes versus infiltrate. The right lung is clear. no significant pleural effusion. No pneumothorax. Stable top-normal cardiac size. No acute osseous pathology. IMPRESSION: Interval placement of an endotracheal tube with tip above the carina. Left lung base and left upper lobe atelectatic changes versus infiltrate. Follow-up recommended. Electronically Signed   By: Elgie Collard M.D.   On: 11/24/2015 22:54   Dg Chest Port 1 View  Result Date: 11/24/2015 CLINICAL DATA:  Pt is morbidly obese. In respiratory distress. Pt could not be sat up more from current position. Pt's eyes are swollen, very red, and were rolled up into top of head. Pt has severe infection/fluid collection in left leg. Hx of asthma and diabetes. EXAM: PORTABLE CHEST 1 VIEW COMPARISON:  2015/12/03 FINDINGS: A Celsius airspace opacity projects within the left mid to upper lung peripherally. Right lung is  clear. No convincing pleural effusion and no pneumothorax. The cardiac silhouette is normal in size configuration. No mediastinal or hilar masses. Bony thorax is unremarkable. IMPRESSION: Left mid to upper lung consolidation consistent with pneumonia. Electronically Signed   By: Amie Portland M.D.   On: 11/24/2015 09:39     STUDIES:  PSG 03/03/15 >> AHI 108, SpO2 low 60%.  CPAP 16 >> AHI 0 Doppler Lt leg 11/23/15 >> negative for DVT, limited study Echo 11/24/15 >> mod LVH, EF 65 to 70%, mod RV dilation, mod TR, PAS 85 mmHg  CULTURES: 7/30 Blood >>  8/01 Sputum >>  ANTIBIOTICS: 7/29 Aztreonam >> 7/30 7/29 Flagyl >> 7/30 7/29 Vancomycin >> 7/31 Rocephin >> 8/01 8/01 Cefepime >>   SIGNIFICANT EVENTS: 7/29 Admit 8/01 VDRF  LINES/TUBES: 8/01 ETT >>8/2 sel extubation, replaced per anesthesia 8/2>>  DISCUSSION: 35 yo male admitted with sepsis from Lt leg cellulitis.  Has hx of tobacco abuse and severe obstructive sleep apnea.  He has been non compliant with CPAP as outpt.  He has previous ABG showing hypoxia/hypercapnia, and this is most likely related to obesity hypoventilation syndrome.  He has previously elevated IgE.  He has progressive respiratory distress with hypoxia, productive cough, and edema.  CXR shows changes of PNA.  ASSESSMENT / PLAN:  PULMONARY A: Acute on chronic hypoxic, hypercapnic respiratory failure from HCAP, OSA/OHS, possible obstructive airways disease, presumed diastolic CHF. Tobacco abuse. Very difficult airway P: Full vent support F/u CXR, ABG Schedule BDs Nicotine patch, will dc 8/3  CARDIAC A: Acute cor pulmonale. Acute on chronic diastolic CHF. Hypotension related to sepsis, and sedation medicines. P: Even fluid balance Wean pressors to keep MAP > 65 Defer CVL for now Hold outpt lisinopril, lasix  RENAL   A: AKI >> baseline creatinine 1.1 P: Monitor renal fx, urine outpt  GASTROENTEROLOGY A: Morbid obesity. P: Tube feeds while on  vent Protonix for SUP  HEMATOLOGY A: Leukocytosis. P: F/u CBC Lovenox for DVT prophylaxis  INFECTION A: Sepsis with HCAP and Lt leg cellulitis.  procalcitonin 2.12 8/3 P: Continue vancomycin, cefepime   ENDOCRINE CBG (last 3)   Recent Labs  11/25/15 1942 11/26/15 0006 11/26/15 0352  GLUCAP 144* 140* 142*     A: DM with neuropathy. P: SSI Hold outpt glucotrol, glucophage  NEUROLOGY A: Acute metabolic encephalopathy. DM neuropathy. P: RASS goal -1 Hold outpt xanax, neurontin, oxycodone Sedation for tube tolerance   Brett Canales Minor ACNP [facility] PCCM Pager [phone] till 3 pm If no answer page [phone] 11/26/2015, 8:59 AM   He remains on increased FiO2/PEEP.  ETT in place.  Sclera injected.  Scattered crackles.  HR regular.  Abd soft.  2+ edema.  Assessment/plan:  Acute on chronic resp failure. - titrate down FiO2 - keep PEEP at 10 for now - diurese as able  HCAP. Cellulitis. - continue Abx  CC time by me independent of APP time 31 minutes.  Coralyn Helling, MD [facility] Pulmonary/Critical Care 11/26/2015, 10:14 AM Pager:  [phone] After 3pm call: [phone]

## 2015-11-27 ENCOUNTER — Inpatient Hospital Stay (HOSPITAL_COMMUNITY): Payer: Medicaid Other

## 2015-11-27 DIAGNOSIS — L03116 Cellulitis of left lower limb: Secondary | ICD-10-CM

## 2015-11-27 DIAGNOSIS — J810 Acute pulmonary edema: Secondary | ICD-10-CM

## 2015-11-27 DIAGNOSIS — J189 Pneumonia, unspecified organism: Secondary | ICD-10-CM

## 2015-11-27 LAB — BASIC METABOLIC PANEL
ANION GAP: 5 (ref 5–15)
ANION GAP: 5 (ref 5–15)
BUN: 22 mg/dL — AB (ref 6–20)
BUN: 23 mg/dL — ABNORMAL HIGH (ref 6–20)
CALCIUM: 8.5 mg/dL — AB (ref 8.9–10.3)
CO2: 41 mmol/L — ABNORMAL HIGH (ref 22–32)
CO2: 42 mmol/L — AB (ref 22–32)
CREATININE: 0.97 mg/dL (ref 0.61–1.24)
CREATININE: 0.98 mg/dL (ref 0.61–1.24)
Calcium: 8.6 mg/dL — ABNORMAL LOW (ref 8.9–10.3)
Chloride: 97 mmol/L — ABNORMAL LOW (ref 101–111)
Chloride: 92 mmol/L — ABNORMAL LOW (ref 101–111)
GFR calc Af Amer: >60 mL/min (ref >60)
GLUCOSE: 147 mg/dL — AB (ref 65–99)
GLUCOSE: 153 mg/dL — AB (ref 65–99)
POTASSIUM: 5 mmol/L (ref 3.5–5.1)
Potassium: 5.2 mmol/L — ABNORMAL HIGH (ref 3.5–5.1)
SODIUM: 143 mmol/L (ref 135–145)
Sodium: 139 mmol/L (ref 135–145)

## 2015-11-27 LAB — CBC
HCT: 44.4 % (ref 39.0–52.0)
Hemoglobin: 13.1 g/dL (ref 13.0–17.0)
MCH: 27.6 pg (ref 26.0–34.0)
MCHC: 29.5 g/dL — ABNORMAL LOW (ref 30.0–36.0)
MCV: 93.7 fL (ref 78.0–100.0)
PLATELETS: 231 10*3/uL (ref 150–400)
RBC: 4.74 MIL/uL (ref 4.22–5.81)
RDW: 14.9 % (ref 11.5–15.5)
WBC: 11.8 10*3/uL — AB (ref 4.0–10.5)

## 2015-11-27 LAB — BLOOD GAS, ARTERIAL
Acid-Base Excess: 11.8 mmol/L — ABNORMAL HIGH (ref 0.0–2.0)
Bicarbonate: 41 mEq/L — ABNORMAL HIGH (ref 20.0–24.0)
DRAWN BY: 418751
FIO2: 0.5
MECHVT: 520 mL
O2 Saturation: 90.1 %
PEEP/CPAP: 10 cmH2O
PH ART: 7.34 — AB (ref 7.350–7.450)
PO2 ART: 66.5 mmHg — AB (ref 80.0–100.0)
Patient temperature: 37
RATE: 16 resp/min
TCO2: 36.7 mmol/L (ref 0–100)
pCO2 arterial: 78 mmHg (ref 35.0–45.0)

## 2015-11-27 LAB — GLUCOSE, CAPILLARY
GLUCOSE-CAPILLARY: 143 mg/dL — AB (ref 65–99)
GLUCOSE-CAPILLARY: 185 mg/dL — AB (ref 65–99)
GLUCOSE-CAPILLARY: 123 mg/dL — AB (ref 65–99)
Glucose-Capillary: 177 mg/dL — ABNORMAL HIGH (ref 65–99)
Glucose-Capillary: 139 mg/dL — ABNORMAL HIGH (ref 65–99)

## 2015-11-27 LAB — CULTURE, BLOOD (ROUTINE X 2)
Culture: NO GROWTH
Culture: NO GROWTH

## 2015-11-27 LAB — VANCOMYCIN, TROUGH: VANCOMYCIN TR: 14 ug/mL — AB (ref 15–20)

## 2015-11-27 MED ORDER — PRO-STAT SUGAR FREE PO LIQD
30.0000 mL | Freq: Three times a day (TID) | ORAL | Status: DC
  Administered 2015-11-27 – 2015-11-30 (×9): 30 mL
  Filled 2015-11-27 (×6): qty 30

## 2015-11-27 MED ORDER — ACETAMINOPHEN 160 MG/5ML PO SOLN
650.0000 mg | ORAL | Status: DC | PRN
  Administered 2015-11-27 – 2015-12-06 (×22): 650 mg
  Administered 2015-12-07: 08:00:00
  Filled 2015-11-27 (×24): qty 20.3

## 2015-11-27 MED ORDER — VANCOMYCIN HCL 10 G IV SOLR: 1250.0000 mg | Freq: Three times a day (TID) | INTRAVENOUS | Status: DC

## 2015-11-27 MED ORDER — FUROSEMIDE 10 MG/ML IJ SOLN
40.0000 mg | Freq: Two times a day (BID) | INTRAMUSCULAR | Status: DC
  Administered 2015-11-27 – 2015-11-29 (×5): 40 mg via INTRAVENOUS
  Filled 2015-11-27 (×5): qty 4

## 2015-11-27 MED ORDER — VITAL HIGH PROTEIN PO LIQD
1000.0000 mL | ORAL | Status: DC
  Administered 2015-11-27 – 2015-11-29 (×3): 1000 mL
  Filled 2015-11-27 (×5): qty 1000

## 2015-11-27 NOTE — Progress Notes (Signed)
Nutrition Follow-up  DOCUMENTATION CODES:   Morbid obesity  INTERVENTION:  - Continue PEPUP protocol. - RD will follow-up 8/7  - Will update TF regimen: Vital High Protein @ 50 mL/hr with 30 mL Prostat TID. This regimen + kcal from current Propofol rate will provide 2036 k cal, 150 grams of protein, and 1003 mL free water.   NUTRITION DIAGNOSIS:   Inadequate oral intake related to inability to eat as evidenced by NPO status. -ongoing  GOAL:   Provide needs based on ASPEN/SCCM guidelines -meeting with current TF regimen.  MONITOR:   Vent status, TF tolerance, Weight trends, Labs, Skin, I & O's  ASSESSMENT:   This is a 35 year old gentleman with multiple medical issues including diabetes. He states he woke up after midnight tonight and had pain in his left lower extremity. He came to the ER. He denies any recent trauma, stating the scarring on the left lower extremity are old scars.  8/4 Pt with NGT in place. No new weight since 7/31. Pt currently receiving Vital High Protein @ 60 mL/hr with 30 mL Prostat BID which is providing 1640 kcal, 156 grams of protein, and 1204 mL free water.  Patient is currently intubated on ventilator support MV: 8.1 L/min Temp (24hrs), Avg:100.9 F (38.3 C), Min:100.6 F (38.1 C), Max:101.3 F (38.5 C) Propofol: 20.3 ml/hr (536 kcal).  Will adjust TF regimen as outlined above based on current Propofol rate and follow-up 8.7.  Medications reviewed; 40 mg IV Lasix BID, sliding scale Novolog, PRN Zofran, 40 mg Protonix/day. Labs reviewed; CBG: 143 mg/dL this AM, Cl: 97 mmol/L, BUN: 22 mg/dL, Ca: 8.6 mg/dL.  Drips: Propofol @ 23 mcg/kg/min, Fentanyl @200  mcg/hr (d/t increased agitation overnight and need to increase sedation).   8/3 - Estimated protein needs updated this AM based on ASPEC/CCM guidelines for morbid obesity and based on current medical dx/medical course.   Patient is currently intubated on ventilator support MV: 9.6  L/min Temp (24hrs), Avg:100.3 F (37.9 C), Min:99.3 F (37.4 C), Max:100.6 F (38.1 C) Propofol: 10.6 ml/hr (280 kcal).  IVF: NS @ 50 mL/hr.  Drips: Propofol @ 12 mcg/min, Fentanyl @ 150 mcg/hr.    8/2 Patient is currently intubated on ventilator support MV: 9.8L/min Temp (24hrs), Avg:100 F (37.8 C), Max:100.9 F (38.3 C) Propofol: 8.59ml/hr (232 kcal).  - No family at bedside - Presents with Sepis from Left Leg Cellulitis.  - Severe OSA. Non compliant with CPAP as outpt.  - Self extubated this morning with Sats into the 50s, reintubated - Morbid obesity.   Diet Order:  Diet NPO time specified  Skin:  Reviewed, no issues (L leg cellulitis, abdominal abrasion)  Last BM:  PTA  Height:   Ht Readings from Last 1 Encounters:  11/10/2015 5\' 9"  (1.753 m)    Weight:   Wt Readings from Last 1 Encounters:  11/23/15 (!) 324 lb (147 kg)    Ideal Body Weight:  72.72 kg  BMI:  Body mass index is 47.85 kg/m.  Estimated Nutritional Needs:   Kcal:  6384-5364  Protein:  145-160 grams (2-2.2 grams/kg IBW)  Fluid:  >/= 1.6L  EDUCATION NEEDS:   No education needs identified at this time    Trenton Gammon, MS, RD, LDN Inpatient Clinical Dietitian Pager # [phone] After hours/weekend pager # [phone]

## 2015-11-27 NOTE — Progress Notes (Signed)
Pharmacy Antibiotic Note  Kenneth Mcfarland is a 35 y.o. male with diabetes admitted on 10/25/2015 with cellulitis after recently cuttling left leg. Pharmacy has been consulted for vancomycin and cefepime dosing as patient has now likely developed HAP.  Today, 11/27/2015  - Still running low-grade fever. WBCs up slightly. SCr ok. Cultures negative to date, sputum ordered but not collected? - Vanc trough below goal of 15-20mg /l and it was drawn a little early so the true trough is likely even lower.   Plan:  Per discussion with CCM, discontinue vancomycin since no MRSA isolated  Cont Cefepime 2g IV q8h.  Height: 5\' 9"  (175.3 cm) Weight: (!) 324 lb (147 kg) IBW/kg (Calculated) : 70.7  Temp (24hrs), Avg:100.9 F (38.3 C), Min:100.6 F (38.1 C), Max:101.3 F (38.5 C)   Recent Labs Lab 11/18/2015 0349 11/23/15 0353 11/24/15 0310  11/25/15 0315 11/25/15 1722 11/26/15 0342 11/27/15 0414 11/27/15 0916  WBC  --  23.9* 15.9*  --  10.7*  --  9.6 11.8*  --   CREATININE  --  1.21  1.20 1.15  --  1.58* 1.12 0.98 0.97  --   LATICACIDVEN 1.48  --   --   --  2.0*  --   --   --   --   VANCOTROUGH  --   --   --   < >  --  8*  --   --  14*  < > = values in this interval not displayed.  Estimated Creatinine Clearance: 150.7 mL/min (by C-G formula based on SCr of 0.97 mg/dL).    Allergies  Allergen Reactions  . Bee Venom Anaphylaxis  . Contrast Media [Iodinated Diagnostic Agents] Nausea And Vomiting       . Wasp Venom Anaphylaxis  . Cherry Other (See Comments)    unknown  . Penicillins Other (See Comments)    Childhood allergic reaction - no other information available  . Vicodin [Hydrocodone-Acetaminophen] Diarrhea    Antimicrobials this admission: 7/30 Flagyl >> 7/31 7/30 Aztreonam >> 7/31 7/31 Rocephin >> 8/1 7/30 Vanc >>  8/4 8/1 Cefepime  >>   Dose adjustments/Drug levels this admission: 8/1 VT at 1600 = 8  on 1250mg  q12 - increase 1250mg  q8h 8/2 SCr increased - hold vanc,  check trough at 1722 (~16hr post dose) = 8 (repeat scr down 1.12) --> change to 1gm q8h 8/4 VT at 1000 = 14 on 1g q8h, and drawn early(5:45 after end of last Vanc infusion). Increase to 1250mg  q8h.   Microbiology results: 7/30 BCx: ngtd 7/30 UCx: multiple, suggest recollect 7/30 MRSA PCR: negative 8/1 sputum: sent (I don't see that it has been sent)  Thank you for allowing pharmacy to be a part of this patient's care.  Loralee Pacas, PharmD, BCPS Pager: [phone] 11/27/2015,11:28 AM.

## 2015-11-27 NOTE — Progress Notes (Signed)
PULMONARY / CRITICAL CARE MEDICINE   Name: Valiant Dills MRN: [id number] DOB: 03-Sep-1980    ADMISSION DATE:  12/17/2015 CONSULTATION DATE:  11/24/2015  REFERRING MD:  Dr. Robb Matar, [facility]  CHIEF COMPLAINT:  Short of breath  SUBJECTIVE:  Agitation overnight, pulling on lines / pt restrained.  Tmax 100.6, net negative in last 24 hours  VITAL SIGNS: BP (!) 146/66   Pulse (!) 108   Temp (!) 100.8 F (38.2 C) (Oral)   Resp 16   Ht 5\' 9"  (1.753 m)   Wt (!) 324 lb (147 kg)   SpO2 90%   BMI 47.85 kg/m   HEMODYNAMICS:    VENTILATOR SETTINGS: Vent Mode: PRVC FiO2 (%):  [45 %-50 %] 50 % Set Rate:  [16 bmp] 16 bmp Vt Set:  [520 mL] 520 mL PEEP:  [10 cmH20] 10 cmH20 Plateau Pressure:  [24 cmH20-37 cmH20] 37 cmH20  INTAKE / OUTPUT: I/O last 3 completed shifts: In: 37 [I.V.:2372; NG/GT:1990; IV Piggyback:1050] Out: 4420 [Urine:4420]  PHYSICAL EXAMINATION: General: morbidly obese male, sedated on vent Neuro:  RASS -3 HEENT:  Scleral edema, ETT in place, MM pink/moist Cardiovascular:  Regular, distant tones Lungs:  Even/non-labored on vent, lungs bilaterally coarse with rhonchi Abdomen:  Obese, soft, non tender Musculoskeletal:  2+ edema Skin:  Excoriation Lt leg, improving  LABS:  BMET  Recent Labs Lab 11/25/15 0315 11/25/15 1722 11/26/15 0342 11/27/15 0414  NA 137  --  141 143  K 5.0  --  4.5 5.0  CL 97*  --  98* 97*  CO2 34*  --  37* 41*  BUN 22*  --  26* 22*  CREATININE 1.58* 1.12 0.98 0.97  GLUCOSE 145*  --  150* 147*    Electrolytes  Recent Labs Lab 11/25/15 0315 11/25/15 1722 11/26/15 0342 11/26/15 1809 11/27/15 0414  CALCIUM 8.7*  --  8.5*  --  8.6*  MG 1.8 2.1 1.9 2.0  --   PHOS 2.7 3.2 3.5 4.4  --     CBC  Recent Labs Lab 11/25/15 0315 11/26/15 0342 11/27/15 0414  WBC 10.7* 9.6 11.8*  HGB 13.7 13.1 13.1  HCT 46.4 44.4 44.4  PLT 189 217 231    Coag's No results for input(s): APTT, INR in the last 168 hours.  Sepsis  Markers  Recent Labs Lab Dec 07, 2015 0349 11/25/15 0315 11/26/15 0342  LATICACIDVEN 1.48 2.0*  --   PROCALCITON  --  4.95 2.12    ABG  Recent Labs Lab 11/25/15 1257 11/26/15 0435 11/27/15 0428  PHART TEST WILL BE CREDITED  TEST WILL BE CREDITED 7.348* 7.340*  PCO2ART TEST WILL BE CREDITED  TEST WILL BE CREDITED 74.9* 78.0*  PO2ART TEST WILL BE CREDITED  TEST WILL BE CREDITED 89.5 66.5*    Liver Enzymes  Recent Labs Lab 12/16/2015 0340 11/23/15 0353  AST 24  --   ALT 19  --   ALKPHOS 78  --   BILITOT 0.6  --   ALBUMIN 3.5 2.9*    Cardiac Enzymes No results for input(s): TROPONINI, PROBNP in the last 168 hours.  Glucose  Recent Labs Lab 11/26/15 0808 11/26/15 1142 11/26/15 1645 11/26/15 1957 11/26/15 2325 11/27/15 0425  GLUCAP 139* 148* 156* 133* 160* 143*    Imaging Dg Abd 1 View  Result Date: 11/25/2015 CLINICAL DATA:  NG tube placement. EXAM: ABDOMEN - 1 VIEW COMPARISON:  Chest x-ray from today as well as lumbar spine 05/03/2014. FINDINGS: Examination includes 2 images with the second  image demonstrating patient's nasogastric tube with tip and side-port over the stomach in the left upper quadrant. Bowel gas pattern is nonobstructive. Remainder of the exam is unchanged. IMPRESSION: Nonobstructive bowel gas pattern. Nasogastric tube with tip and side-port over the stomach in the left upper quadrant. Electronically Signed   By: Elberta Fortis M.D.   On: 11/25/2015 09:57   Dg Chest Port 1 View  Result Date: 11/27/2015 CLINICAL DATA:  Respiratory failure EXAM: PORTABLE CHEST 1 VIEW COMPARISON:  11/25/2015 FINDINGS: Endotracheal tube tip between the clavicular heads and carina. An orogastric tube reaches the stomach. Cardiopericardial enlargement is unchanged. Enlarged pulmonary arterial contour. Increase in symmetric perihilar hazy opacity. Retrocardiac air bronchograms. There may be layering pleural fluid. No pneumothorax. IMPRESSION: 1. Stable positioning of  endotracheal and orogastric tubes. 2. Cardiopericardial enlargement. Large appearance of the pulmonary artery suggesting hypertension. 3. Worsening edema. 4. Retrocardiac air bronchograms suggest superimposed pneumonia. Electronically Signed   By: Marnee Spring M.D.   On: 11/27/2015 07:10   Dg Chest Port 1 View  Result Date: 11/25/2015 CLINICAL DATA:  ET tube repositioned. EXAM: PORTABLE CHEST 1 VIEW COMPARISON:  Portable film earlier today. FINDINGS: ETT tip now 5.5 cm above carina. Enteric tube tip is in the stomach, but could be advanced as the last side hole is at the GE junction. There is LEFT lower lobe pneumonia. Vascular congestion is noted. LEFT pleural effusion. IMPRESSION: ETT repositioning, now 5.5 cm above carina. Stable LEFT lower lobe pneumonia. Consider advancement of enteric tube. Electronically Signed   By: Elsie Stain M.D.   On: 11/25/2015 09:56     STUDIES:  PSG 03/03/15 >> AHI 108, SpO2 low 60%.  CPAP 16 >> AHI 0 Doppler Lt leg 11/23/15 >> negative for DVT, limited study Echo 11/24/15 >> mod LVH, EF 65 to 70%, mod RV dilation, mod TR, PAS 85 mmHg  CULTURES: 7/30 Blood >>  8/01 Sputum >>  ANTIBIOTICS: 7/29 Aztreonam >> 7/30 7/29 Flagyl >> 7/30 7/29 Vancomycin >> 7/31 Rocephin >> 8/01 8/01 Cefepime >>   SIGNIFICANT EVENTS: 7/29 Admit 8/01 VDRF  LINES/TUBES: 8/01 ETT >> 8/2 sel extubation, replaced per anesthesia 8/2 >>  DISCUSSION: 35 y/o male admitted with sepsis from Lt leg cellulitis.  Has hx of tobacco abuse and severe obstructive sleep apnea.  He has been non compliant with CPAP as outpt.  He has previous ABG showing hypoxia/hypercapnia, and this is most likely related to obesity hypoventilation syndrome.  He has previously elevated IgE.  He has progressive respiratory distress with hypoxia, productive cough, and edema.  CXR shows changes of PNA in LLL.   ASSESSMENT / PLAN:  PULMONARY A: Acute on chronic hypoxic, hypercapnic respiratory failure from  HCAP, OSA/OHS, possible obstructive airways disease, presumed diastolic CHF. Tobacco abuse. Very difficult airway P: PRVC 8cc/kg Wean PEEP / FiO2 for sats > 92% Intermittent CXR Schedule BDs Lasix as renal function & BP permit for negative balance  CARDIAC A: Acute cor pulmonale. Acute on chronic diastolic CHF. Hypotension related to sepsis, and sedation medicines. P: Lasix 40 mg IV BID Goal MAP > 65 Defer CVL for now Hold outpt lisinopril Tele monitoring  RENAL A: AKI - baseline creatinine 1.1 P: Monitor renal fx, urine outpt Replace electrolytes as indicated Repeat BMP @ 1500   GASTROENTEROLOGY A: Morbid obesity. P: Tube feeds while on vent Protonix for SUP  HEMATOLOGY A: Leukocytosis. P: F/u CBC Lovenox for DVT prophylaxis  INFECTION A: Sepsis with HCAP and Lt leg cellulitis, procalcitonin 2.12 8/3  P: Continue vancomycin, cefepime Trend PCT Narrow ABX as able   ENDOCRINE A: DM with neuropathy. P: SSI Hold outpt glucotrol, glucophage  NEUROLOGY A: Acute metabolic encephalopathy. DM neuropathy. P: RASS goal -1 Hold outpt xanax, neurontin, oxycodone Sedation for tube tolerance    Canary Brim, NP-C [facility] Pgr: [phone] or if no answer [phone] 11/27/2015, 8:35 AM   ATTENDING NOTE / ATTESTATION NOTE :   I have discussed the case with the resident/APP  Canary Brim.   I agree with the resident/APP's  history, physical examination, assessment, and plans.    I have edited the above note and modified it according to our agreed history, physical examination, assessment and plan.   Briefly, pt admitted with cellulitis, complicated by acute on chronic hypoxemic hypercapneic resp fx 2/2 untreated OSA/OHS. Very difficult intubation. Reintubated 24 hrs ago after he self extubated.  Concern for HCAP now. Also, has volume overload/pulm edema with wheezing on exam. Plan to keep on the vent over the weekend. Try weaning  next week, not sure if he will do well.  I anticipate he will need a trache 2/2 his non compliance to cpap and has chronic hypercapnea.  Needs diuresis today. Follow up electrolytes.  Cont abx pending final cultures. I called up and spoke with pt's mother Charleene and updated her of son's over all condition and prognosis. Mentioned to her the possibility of a trachesotomy.   I have spent 35  minutes of critical care time with this patient today.   Pollie Meyer, MD 11/27/2015, 2:05 PM [facility] Pager [phone] After 3 pm or if no answer, call [phone]

## 2015-11-27 NOTE — Progress Notes (Addendum)
eLink Physician-Brief Progress Note Patient Name: Kenneth Mcfarland DOB: 1980/12/12 MRN: [id number]   Date of Service  11/27/2015  HPI/Events of Note  fever  eICU Interventions  resp culture Blood culture     Intervention Category Major Interventions: Infection - evaluation and management  Max Fickle 11/27/2015, 5:24 PM

## 2015-11-27 NOTE — Progress Notes (Signed)
Pharmacy Antibiotic Note  Kenneth Mcfarland is a 35 y.o. male with diabetes admitted on 2015-12-08 with cellulitis after recently cuttling left leg. Pharmacy has been consulted for vancomycin and cefepime dosing as patient has now likely developed HAP.  Today, 11/27/2015  - Still running low-grade fever. WBCs up slightly. SCr ok. Cultures negative to date, sputum ordered but not collected? - Vanc trough below goal of 15-20mg /l and it was drawn a little early so the true trough is likely even lower.   Plan:  Increase Vanc to 1250mg  q8h.  Check Vanc trough at steady state.  Cont Cefepime 2g IV q8h.  Follow up culture data and narrow abx spectrum as appropriate.  Height: 5\' 9"  (175.3 cm) Weight: (!) 324 lb (147 kg) IBW/kg (Calculated) : 70.7  Temp (24hrs), Avg:100.9 F (38.3 C), Min:100.6 F (38.1 C), Max:101.3 F (38.5 C)   Recent Labs Lab 12/08/2015 0349 11/23/15 0353 11/24/15 0310  11/25/15 0315 11/25/15 1722 11/26/15 0342 11/27/15 0414 11/27/15 0916  WBC  --  23.9* 15.9*  --  10.7*  --  9.6 11.8*  --   CREATININE  --  1.21  1.20 1.15  --  1.58* 1.12 0.98 0.97  --   LATICACIDVEN 1.48  --   --   --  2.0*  --   --   --   --   VANCOTROUGH  --   --   --   < >  --  8*  --   --  14*  < > = values in this interval not displayed.  Estimated Creatinine Clearance: 150.7 mL/min (by C-G formula based on SCr of 0.97 mg/dL).    Allergies  Allergen Reactions  . Bee Venom Anaphylaxis  . Contrast Media [Iodinated Diagnostic Agents] Nausea And Vomiting       . Wasp Venom Anaphylaxis  . Cherry Other (See Comments)    unknown  . Penicillins Other (See Comments)    Childhood allergic reaction - no other information available  . Vicodin [Hydrocodone-Acetaminophen] Diarrhea    Antimicrobials this admission: 7/30 Flagyl >> 7/31 7/30 Aztreonam >> 7/31 7/31 Rocephin >> 8/1 7/30 Vanc >>   8/1 Cefepime  >>   Dose adjustments/Drug levels this admission: 8/1 VT at 1600 = 8  on 1250mg   q12 - increase 1250mg  q8h 8/2 SCr increased - hold vanc, check trough at 1722 (~16hr post dose) = 8 (repeat scr down 1.12) --> change to 1gm q8h 8/4 VT at 1000 = 14 on 1g q8h, and drawn early(5:45 after end of last Vanc infusion). Increase to 1250mg  q8h.   Microbiology results: 7/30 BCx: ngtd 7/30 UCx: multiple, suggest recollect 7/30 MRSA PCR: negative 8/1 sputum: sent (I don't see that it has been sent)  Thank you for allowing pharmacy to be a part of this patient's care.  Charolotte Eke, PharmD, pager [phone]. 11/27/2015,11:09 AM.

## 2015-11-28 DIAGNOSIS — L0390 Cellulitis, unspecified: Secondary | ICD-10-CM

## 2015-11-28 DIAGNOSIS — Z720 Tobacco use: Secondary | ICD-10-CM

## 2015-11-28 DIAGNOSIS — F4310 Post-traumatic stress disorder, unspecified: Secondary | ICD-10-CM

## 2015-11-28 DIAGNOSIS — E1140 Type 2 diabetes mellitus with diabetic neuropathy, unspecified: Secondary | ICD-10-CM

## 2015-11-28 LAB — RENAL FUNCTION PANEL
ALBUMIN: 2.4 g/dL — AB (ref 3.5–5.0)
Anion gap: 7 (ref 5–15)
BUN: 28 mg/dL — AB (ref 6–20)
CALCIUM: 8.9 mg/dL (ref 8.9–10.3)
CO2: 43 mmol/L — ABNORMAL HIGH (ref 22–32)
CREATININE: 1.04 mg/dL (ref 0.61–1.24)
Chloride: 92 mmol/L — ABNORMAL LOW (ref 101–111)
GFR calc Af Amer: >60 mL/min (ref >60)
GLUCOSE: 120 mg/dL — AB (ref 65–99)
PHOSPHORUS: 4.9 mg/dL — AB (ref 2.5–4.6)
POTASSIUM: 4.9 mmol/L (ref 3.5–5.1)
SODIUM: 142 mmol/L (ref 135–145)

## 2015-11-28 LAB — CBC WITH DIFFERENTIAL/PLATELET
BASOS ABS: 0.1 10*3/uL (ref 0.0–0.1)
BASOS PCT: 1 %
EOS ABS: 0.4 10*3/uL (ref 0.0–0.7)
Eosinophils Relative: 4 %
HCT: 43.9 % (ref 39.0–52.0)
Hemoglobin: 13.1 g/dL (ref 13.0–17.0)
LYMPHS ABS: 1.6 10*3/uL (ref 0.7–4.0)
Lymphocytes Relative: 16 %
MCH: 28.2 pg (ref 26.0–34.0)
MCHC: 29.8 g/dL — ABNORMAL LOW (ref 30.0–36.0)
MCV: 94.4 fL (ref 78.0–100.0)
MONO ABS: 1.2 10*3/uL — AB (ref 0.1–1.0)
Monocytes Relative: 12 %
NEUTROS ABS: 6.6 10*3/uL (ref 1.7–7.7)
Neutrophils Relative %: 67 %
PLATELETS: 262 10*3/uL (ref 150–400)
RBC: 4.65 MIL/uL (ref 4.22–5.81)
RDW: 15.1 % (ref 11.5–15.5)
WBC: 9.9 10*3/uL (ref 4.0–10.5)

## 2015-11-28 LAB — GLUCOSE, CAPILLARY
GLUCOSE-CAPILLARY: 115 mg/dL — AB (ref 65–99)
GLUCOSE-CAPILLARY: 128 mg/dL — AB (ref 65–99)
GLUCOSE-CAPILLARY: 162 mg/dL — AB (ref 65–99)
GLUCOSE-CAPILLARY: 163 mg/dL — AB (ref 65–99)
Glucose-Capillary: 125 mg/dL — ABNORMAL HIGH (ref 65–99)
Glucose-Capillary: 125 mg/dL — ABNORMAL HIGH (ref 65–99)

## 2015-11-28 LAB — MAGNESIUM: MAGNESIUM: 2.1 mg/dL (ref 1.7–2.4)

## 2015-11-28 LAB — TRIGLYCERIDES: Triglycerides: 138 mg/dL (ref <150)

## 2015-11-28 LAB — PROCALCITONIN: Procalcitonin: 0.94 ng/mL

## 2015-11-28 MED ORDER — NICOTINE 7 MG/24HR TD PT24
7.0000 mg | MEDICATED_PATCH | Freq: Every day | TRANSDERMAL | Status: DC
  Administered 2015-11-28 – 2015-12-07 (×10): 7 mg via TRANSDERMAL
  Filled 2015-11-28 (×10): qty 1

## 2015-11-28 MED ORDER — FENTANYL BOLUS VIA INFUSION
25.0000 ug | INTRAVENOUS | Status: DC | PRN
  Administered 2015-11-29: 100 ug via INTRAVENOUS
  Administered 2015-11-30 (×3): 50 ug via INTRAVENOUS
  Administered 2015-12-01: 100 ug via INTRAVENOUS
  Administered 2015-12-01 – 2015-12-03 (×6): 50 ug via INTRAVENOUS
  Administered 2015-12-03: 25 ug via INTRAVENOUS
  Filled 2015-11-28: qty 100

## 2015-11-28 MED ORDER — VANCOMYCIN HCL 10 G IV SOLR
1250.0000 mg | Freq: Three times a day (TID) | INTRAVENOUS | Status: DC
  Administered 2015-11-28 – 2015-11-30 (×6): 1250 mg via INTRAVENOUS
  Filled 2015-11-28 (×6): qty 1250

## 2015-11-28 MED ORDER — GABAPENTIN 300 MG PO CAPS
300.0000 mg | ORAL_CAPSULE | Freq: Two times a day (BID) | ORAL | Status: DC
  Administered 2015-11-28 – 2015-12-07 (×19): 300 mg
  Filled 2015-11-28 (×19): qty 1

## 2015-11-28 MED ORDER — HEPARIN SODIUM (PORCINE) 5000 UNIT/ML IJ SOLN
5000.0000 [IU] | Freq: Three times a day (TID) | INTRAMUSCULAR | Status: DC
Start: 2015-11-28 — End: 2015-12-07
  Administered 2015-11-28 – 2015-12-07 (×28): 5000 [IU] via SUBCUTANEOUS
  Filled 2015-11-28 (×27): qty 1

## 2015-11-28 MED ORDER — MIDAZOLAM HCL 2 MG/2ML IJ SOLN
1.0000 mg | INTRAMUSCULAR | Status: DC | PRN
  Administered 2015-11-29 – 2015-11-30 (×4): 2 mg via INTRAVENOUS
  Filled 2015-11-28 (×3): qty 2
  Filled 2015-11-28: qty 4
  Filled 2015-11-28: qty 2

## 2015-11-28 NOTE — Progress Notes (Signed)
Pharmacy Antibiotic Note  Kenneth Mcfarland is a 35 y.o. male with diabetes admitted on 10/26/2015 with cellulitis after recently cuttling left leg. Pharmacy has been consulted for vancomycin and cefepime dosing as patient has now likely developed HAP.  Plan:  Cont Cefepime 2g IV q8h.  To restart vancomycin today per Md orders for continued fevers. Will restart at dose that vanc was going to adjusted to after trough yesterday - 1250mg  IV q8  Height: 5\' 9"  (175.3 cm) Weight: (!) 324 lb (147 kg) IBW/kg (Calculated) : 70.7  Temp (24hrs), Avg:100.4 F (38 C), Min:99 F (37.2 C), Max:101.8 F (38.8 C)   Recent Labs Lab 11/20/2015 0349 11/23/15 0353 11/24/15 0310  11/25/15 0315 11/25/15 1722 11/26/15 0342 11/27/15 0414 11/27/15 0916 11/27/15 1555  WBC  --  23.9* 15.9*  --  10.7*  --  9.6 11.8*  --   --   CREATININE  --  1.21  1.20 1.15  --  1.58* 1.12 0.98 0.97  --  0.98  LATICACIDVEN 1.48  --   --   --  2.0*  --   --   --   --   --   VANCOTROUGH  --   --   --   < >  --  8*  --   --  14*  --   < > = values in this interval not displayed.  Estimated Creatinine Clearance: 149.2 mL/min (by C-G formula based on SCr of 0.98 mg/dL).    Allergies  Allergen Reactions  . Bee Venom Anaphylaxis  . Contrast Media [Iodinated Diagnostic Agents] Nausea And Vomiting       . Wasp Venom Anaphylaxis  . Cherry Other (See Comments)    unknown  . Penicillins Other (See Comments)    Childhood allergic reaction - no other information available  . Vicodin [Hydrocodone-Acetaminophen] Diarrhea    Antimicrobials this admission: 7/30 Flagyl >> 7/31 7/30 Aztreonam >> 7/31 7/31 Rocephin >> 8/1 7/30 Vanc >>  8/4 8/1 Cefepime  >>  8/5 Vanc >>  Dose adjustments/Drug levels this admission: 8/1 VT at 1600 = 8  on 1250mg  q12 - increase 1250mg  q8h 8/2 SCr increased - hold vanc, check trough at 1722 (~16hr post dose) = 8 (repeat scr down 1.12) --> change to 1gm q8h 8/4 VT at 1000 = 14 on 1g q8h, and  drawn early(5:45 after end of last Vanc infusion). Increase to 1250mg  q8h.   Microbiology results: 7/30 BCx: ngtd 7/30 UCx: multiple, suggest recollect 7/30 MRSA PCR: negative 8/1 sputum: sent (I don't see that it has been sent)   Thank you for allowing pharmacy to be a part of this patient's care.  Hessie Knows, PharmD, BCPS Pager [phone] 11/28/2015 1:42 PM

## 2015-11-28 NOTE — Progress Notes (Signed)
PULMONARY / CRITICAL CARE MEDICINE   Name: Kenneth Mcfarland MRN: [id number] DOB: [birth year]    ADMISSION DATE:  November 30, 2015 CONSULTATION DATE:  11/24/2015  REFERRING MD:  Dr. Robb Matar, [facility]  CHIEF COMPLAINT:  Short of breath  SUBJECTIVE:  Patient febrile overnight. Repeat cultures were sent. Continues to have increasing FiO2 requirement on ventilator. Additionally, patient is having upper airway thick, tenuous secretions.  REVIEW OF SYSTEMS:  Unable to obtain with intubation & sedation.  VITAL SIGNS: BP (!) 123/46   Pulse 99   Temp 99.9 F (37.7 C)   Resp 16   Ht  (1.753 m)   Wt (!) 324 lb (147 kg)   SpO2 92%   BMI 47.85 kg/m   HEMODYNAMICS:    VENTILATOR SETTINGS: Vent Mode: PRVC FiO2 (%):  [50 %-60 %] 60 % Set Rate:  [16 bmp] 16 bmp Vt Set:  [520 mL] 520 mL PEEP:  [10 cmH20] 10 cmH20 Plateau Pressure:  [24 cmH20-29 cmH20] 27 cmH20  INTAKE / OUTPUT: I/O last 3 completed shifts: In: 3854 [I.V.:1334; NG/GT:1870; IV Piggyback:650] Out: 8225 [Urine:8225]  PHYSICAL EXAMINATION: General: No distress. Family at bedside. Morbid obesity. Neuro:  Sedated. No spontaneous movements. Pupils symmetric. HEENT:  Endotracheal tube in place. Notable scleral edema. Moist mucous membranes.  Cardiovascular:  Regular rate. Distant heart sounds. Unable to appreciate JVD given body habitus.  Pulmonary: Coarse breath sounds bilaterally. Symmetric chest wall rise on ventilator.  Abdomen:  soft. Protuberant. Normal bowel sounds. Integument: Warm and dry. No rash on exposed skin.   LABS:  BMET  Recent Labs Lab 11/26/15 0342 11/27/15 0414 11/27/15 1555  NA 141 143 139  K 4.5 5.0 5.2*  CL 98* 97* 92*  CO2 37* 41* 42*  BUN 26* 22* 23*  CREATININE 0.98 0.97 0.98  GLUCOSE 150* 147* 153*    Electrolytes  Recent Labs Lab 11/25/15 1722 11/26/15 0342 11/26/15 1809 11/27/15 0414 11/27/15 1555  CALCIUM  --  8.5*  --  8.6* 8.5*  MG 2.1 1.9 2.0  --   --   PHOS 3.2 3.5 4.4  --    --     CBC  Recent Labs Lab 11/25/15 0315 11/26/15 0342 11/27/15 0414  WBC 10.7* 9.6 11.8*  HGB 13.7 13.1 13.1  HCT 46.4 44.4 44.4  PLT 189 217 231    Coag's No results for input(s): APTT, INR in the last 168 hours.  Sepsis Markers  Recent Labs Lab November 30, 2015 0349 11/25/15 0315 11/26/15 0342 11/28/15 0328  LATICACIDVEN 1.48 2.0*  --   --   PROCALCITON  --  4.95 2.12 0.94    ABG  Recent Labs Lab 11/25/15 1257 11/26/15 0435 11/27/15 0428  PHART TEST WILL BE CREDITED  TEST WILL BE CREDITED 7.348* 7.340*  PCO2ART TEST WILL BE CREDITED  TEST WILL BE CREDITED 74.9* 78.0*  PO2ART TEST WILL BE CREDITED  TEST WILL BE CREDITED 89.5 66.5*    Liver Enzymes  Recent Labs Lab 11/30/2015 0340 11/23/15 0353  AST 24  --   ALT 19  --   ALKPHOS 78  --   BILITOT 0.6  --   ALBUMIN 3.5 2.9*    Cardiac Enzymes No results for input(s): TROPONINI, PROBNP in the last 168 hours.  Glucose  Recent Labs Lab 11/27/15 1221 11/27/15 1533 11/27/15 1938 11/27/15 2348 11/28/15 0401 11/28/15 0824  GLUCAP 185* 139* 123* 163* 115* 162*    Imaging Dg Chest Port 1 View  Result Date: 11/27/2015 CLINICAL DATA:  Respiratory  failure EXAM: PORTABLE CHEST 1 VIEW COMPARISON:  11/25/2015 FINDINGS: Endotracheal tube tip between the clavicular heads and carina. An orogastric tube reaches the stomach. Cardiopericardial enlargement is unchanged. Enlarged pulmonary arterial contour. Increase in symmetric perihilar hazy opacity. Retrocardiac air bronchograms. There may be layering pleural fluid. No pneumothorax. IMPRESSION: 1. Stable positioning of endotracheal and orogastric tubes. 2. Cardiopericardial enlargement. Large appearance of the pulmonary artery suggesting hypertension. 3. Worsening edema. 4. Retrocardiac air bronchograms suggest superimposed pneumonia. Electronically Signed   By: Marnee Spring M.D.   On: 11/27/2015 07:10     STUDIES:  PSG 03/03/15: AHI 108, SpO2 low 60%.  CPAP 16  >> AHI 0 Doppler Lt leg 11/23/15: negative for DVT, limited study TTE 11/24/15: mod LVH, EF 65 to 70%, mod RV dilation, mod TR, PAS 85 mmHg Port CXR 8/4:  ETT & Enteric tubes in good position. Silhouetting left hemidiaphragm with some air bronchograms suggestive of consolidation. Blunting of right costophrenic angle along with fluid within right fissure suggestive of pleural effusion.  MICROBIOLOGY: Urine Ctx 7/30:  Multiple Species Blood Ctx x2 7/30:  Negative  MRSA PCR 7/30:  Negative  Tracheal Asp Ctx 8/4 >> Blood Ctx x2 8/4 >>  ANTIBIOTICS: Aztreonam 7/29 - 7/30 Flagyl 7/29 - 7/30 Rocephin 7/32 - 8/1 Vancomycin 7/29 >> Cefepime 8/1 >>   SIGNIFICANT EVENTS: 7/29 Admit 8/01 VDRF  LINES/TUBES: OETT 8/1 - 8/2 (self extubation); 8/2 (reintubated by anesthesia w/ difficult airway) >> NGT 8/1 >> FOLEY >> PIV x2  ASSESSMENT / PLAN:  PULMONARY A: Acute on Chronic Hypoxic Respiratory Failure - Presumed diastolic CHF. Acute on Chronic Hypercarbic Respiratory Failure HCAP OSA/OHS Narcolepsy EXTREMELY DIFFICULTY AIRWAY Possible COPD/Asthma Tobacco Use Disorder  P: Full Vent Support Holding on SBT & WUA until ventilator requirement improves significantly Weaning FiO2 for Sat >92% Holding on PEEP wean until FiO2 </= 0.4 Duoneb q6hr Continuing Lasix 40mg  IV q12hr Nicotine 7mg  Patch  CARDIAC A: Acute Cor Pulmonale/RV Failure Acute on Chronic Diastolic CHF Hypotension - Resolved. Secondary to sedation. H/O Hypertension  P: Monitoring on Telemetry Vitals per unit protocol Continuing Lasix diuresis Goal MAP > 65 Holding outpatient Lisinopril  RENAL A: Acute Renal Failure - Resolved. Hyperkalemia - Mild w/ slight hemolysis on 8/4.  P: Trending UOP with Foley Repeat electrolytes now Monitoring electrolytes & renal funciton daily Replacing electrolytes as indicated  GASTROENTEROLOGY A: No acute issue.  P: Continuing Tube Feedings Protonix VT  daily  HEMATOLOGY A: Leukocytosis - Mild. Possibly secondary to sepsis.  P: Trending cell counts daily w/ CBC Switching from Lovenox to Heparin [place] q8hr Holding on SCDs given cellulitis  INFECTION A: Sepsis Left Leg Cellulitis LLL HCAP  P: Continuing Empiric Vancomycin & Cefepime Trending Procalcitonin per algorithm Awaiting culture results Ordering Tracheal Asp Ctx Stat  ENDOCRINE A: H/O DM Type 2  P: Accu-Checks q4hr SSI per Resistant Algorithm Holding outpt glucotrol & glucophage  NEUROLOGY A: Acute Encephalopathy - Multifactorial w/ metabolic, hypercarbic, & hypoxic. Sedation on Ventilator Chronic Outpatient Oxycodone & Xanax H/O Diabetic Neuropathy H/O Bipolar Disorder H/O PTSD  P: RASS goal: -1 to -2 Fentanyl gtt & IV prn Pain Versed IV prn Sedation Propofol gtt Hold outpt xanax & oxycodone Resume outpatient Neurontin 300mg  bid   TODAY'S SUMMARY:  35 y/o male admitted with sepsis from Lt leg cellulitis.  Has hx of tobacco abuse and severe obstructive sleep apnea.  He has been non compliant with CPAP as outpt.  Patient has worsening oxygenation. Attempting to increase PEEP for recruitment.  Continuing Lasix diuresis as long as renal function and blood pressure allow. Restarting home Neurontin. Also place a nicotine patch. Family updated via phone and at bedside by Dr. Celene Skeen.   I spent a total of 39 minutes of critical care time today caring for the patient, updating family at bedside, and reviewing the patient's electronic medical record.  Donna Christen Jamison Neighbor, M.D. [facility] Pager:  [phone] After 3pm or if no response, call [phone] 11/28/2015, 1:07 PM

## 2015-11-29 LAB — BLOOD GAS, ARTERIAL
Acid-Base Excess: 18.4 mmol/L — ABNORMAL HIGH (ref 0.0–2.0)
Bicarbonate: 46.5 mEq/L — ABNORMAL HIGH (ref 20.0–24.0)
DRAWN BY: 232811
FIO2: 0.6
MECHVT: 570 mL
O2 Saturation: 96.9 %
PEEP: 12 cmH2O
PO2 ART: 104 mmHg — AB (ref 80.0–100.0)
Patient temperature: 37.9
RATE: 16 resp/min
TCO2: 40.8 mmol/L (ref 0–100)
pCO2 arterial: 71.8 mmHg (ref 35.0–45.0)
pH, Arterial: 7.432 (ref 7.350–7.450)

## 2015-11-29 LAB — CBC WITH DIFFERENTIAL/PLATELET
Basophils Absolute: 0.1 10*3/uL (ref 0.0–0.1)
Basophils Relative: 1 %
EOS ABS: 0.5 10*3/uL (ref 0.0–0.7)
EOS PCT: 5 %
HCT: 43.4 % (ref 39.0–52.0)
Hemoglobin: 13.3 g/dL (ref 13.0–17.0)
LYMPHS ABS: 2 10*3/uL (ref 0.7–4.0)
Lymphocytes Relative: 20 %
MCH: 28.5 pg (ref 26.0–34.0)
MCHC: 30.6 g/dL (ref 30.0–36.0)
MCV: 93.1 fL (ref 78.0–100.0)
MONO ABS: 0.9 10*3/uL (ref 0.1–1.0)
Monocytes Relative: 9 %
Neutro Abs: 6.3 10*3/uL (ref 1.7–7.7)
Neutrophils Relative %: 65 %
PLATELETS: 265 10*3/uL (ref 150–400)
RBC: 4.66 MIL/uL (ref 4.22–5.81)
RDW: 14.6 % (ref 11.5–15.5)
WBC: 9.8 10*3/uL (ref 4.0–10.5)

## 2015-11-29 LAB — RENAL FUNCTION PANEL
ANION GAP: 4 — AB (ref 5–15)
Albumin: 2.4 g/dL — ABNORMAL LOW (ref 3.5–5.0)
BUN: 34 mg/dL — ABNORMAL HIGH (ref 6–20)
CALCIUM: 9 mg/dL (ref 8.9–10.3)
CHLORIDE: 91 mmol/L — AB (ref 101–111)
CO2: 45 mmol/L — AB (ref 22–32)
Creatinine, Ser: 0.98 mg/dL (ref 0.61–1.24)
GFR calc non Af Amer: >60 mL/min (ref >60)
GLUCOSE: 130 mg/dL — AB (ref 65–99)
POTASSIUM: 4.7 mmol/L (ref 3.5–5.1)
Phosphorus: 4.9 mg/dL — ABNORMAL HIGH (ref 2.5–4.6)
Sodium: 140 mmol/L (ref 135–145)

## 2015-11-29 LAB — MAGNESIUM: Magnesium: 2.1 mg/dL (ref 1.7–2.4)

## 2015-11-29 LAB — GLUCOSE, CAPILLARY
GLUCOSE-CAPILLARY: 143 mg/dL — AB (ref 65–99)
GLUCOSE-CAPILLARY: 151 mg/dL — AB (ref 65–99)
GLUCOSE-CAPILLARY: 159 mg/dL — AB (ref 65–99)
Glucose-Capillary: 127 mg/dL — ABNORMAL HIGH (ref 65–99)
Glucose-Capillary: 126 mg/dL — ABNORMAL HIGH (ref 65–99)
Glucose-Capillary: 111 mg/dL — ABNORMAL HIGH (ref 65–99)

## 2015-11-29 LAB — PROCALCITONIN: Procalcitonin: 0.7 ng/mL

## 2015-11-29 MED ORDER — DOCUSATE SODIUM 50 MG/5ML PO LIQD
100.0000 mg | Freq: Two times a day (BID) | ORAL | Status: DC
Start: 2015-11-29 — End: 2015-12-02
  Administered 2015-11-29 – 2015-12-01 (×6): 100 mg
  Filled 2015-11-29 (×6): qty 10

## 2015-11-29 MED ORDER — ACETAZOLAMIDE 250 MG PO TABS
250.0000 mg | ORAL_TABLET | Freq: Once | ORAL | Status: AC
  Administered 2015-11-29: 250 mg
  Filled 2015-11-29: qty 1

## 2015-11-29 MED ORDER — SENNOSIDES 8.8 MG/5ML PO SYRP
5.0000 mL | ORAL_SOLUTION | Freq: Two times a day (BID) | ORAL | Status: DC
  Administered 2015-11-29 – 2015-12-07 (×17): 5 mL
  Filled 2015-11-29 (×18): qty 5

## 2015-11-29 MED ORDER — ACETAZOLAMIDE ORAL SUSPENSION 25 MG/ML: 250.0000 mg | Freq: Once | ORAL | Status: DC

## 2015-11-29 MED ORDER — FUROSEMIDE 10 MG/ML IJ SOLN
20.0000 mg | Freq: Two times a day (BID) | INTRAMUSCULAR | Status: DC
  Administered 2015-11-29 – 2015-11-30 (×3): 20 mg via INTRAVENOUS
  Filled 2015-11-29 (×3): qty 2

## 2015-11-29 NOTE — Progress Notes (Signed)
PULMONARY / CRITICAL CARE MEDICINE   Name: Kenneth Mcfarland MRN: [id number] DOB: Mar 28, 1981    ADMISSION DATE:  November 23, 2015 CONSULTATION DATE:  11/24/2015  REFERRING MD:  Dr. Robb Matar, [facility]  CHIEF COMPLAINT:  Short of breath  SUBJECTIVE:  No acute events overnight. Patient weaning on FiO2. Diuresing well with Lasix 40 mg IV every 12 hours. Still no bowel movement. Nods no to any pain or difficulty breathing.  REVIEW OF SYSTEMS:  Unable to obtain with intubation & sedation.  VITAL SIGNS: BP (!) 133/55   Pulse 94   Temp 100 F (37.8 C)   Resp 16   Ht  (1.753 m)   Wt (!) 324 lb (147 kg)   SpO2 96%   BMI 47.85 kg/m   HEMODYNAMICS:    VENTILATOR SETTINGS: Vent Mode: PRVC FiO2 (%):  [50 %-60 %] 50 % Set Rate:  [16 bmp] 16 bmp Vt Set:  [520 mL-570 mL] 570 mL PEEP:  [10 cmH20-12 cmH20] 12 cmH20 Plateau Pressure:  [25 cmH20-28 cmH20] 28 cmH20  INTAKE / OUTPUT: I/O last 3 completed shifts: In: 3548.7 [I.V.:998.7; NG/GT:2050; IV Piggyback:500] Out: 8200 [Urine:7850; Emesis/NG output:350]  PHYSICAL EXAMINATION: General: No distress. No family at bedside. Morbid obesity. Neuro:  Sedated. Giving thumbs up and wiggling toes appropriately on command. Moving all 4 extremities. Nods to questions. HEENT:  Endotracheal tube in place. Notable scleral edema mildly improved. Moist mucous membranes.  Cardiovascular:  Regular rate. Distant heart sounds. Unable to appreciate JVD given body habitus. No edema. Pulmonary: Coarse breath sounds bilaterally improving. Symmetric chest wall rise on ventilator.  Abdomen:  Soft. Protuberant. Hypoactive  bowel sounds. Integument: Warm and dry. No rash on exposed skin. 2 blisters on left lower leg.  LABS:  BMET  Recent Labs Lab 11/27/15 1555 11/28/15 0324 11/29/15 0342  NA 139 142 140  K 5.2* 4.9 4.7  CL 92* 92* 91*  CO2 42* 43* 45*  BUN 23* 28* 34*  CREATININE 0.98 1.04 0.98  GLUCOSE 153* 120* 130*    Electrolytes  Recent Labs Lab  11/26/15 1809  11/27/15 1555 11/28/15 0324 11/29/15 0342  CALCIUM  --   < > 8.5* 8.9 9.0  MG 2.0  --   --  2.1 2.1  PHOS 4.4  --   --  4.9* 4.9*  < > = values in this interval not displayed.  CBC  Recent Labs Lab 11/27/15 0414 11/28/15 0324 11/29/15 0342  WBC 11.8* 9.9 9.8  HGB 13.1 13.1 13.3  HCT 44.4 43.9 43.4  PLT 231 262 265    Coag's No results for input(s): APTT, INR in the last 168 hours.  Sepsis Markers  Recent Labs Lab 11/25/15 0315 11/26/15 0342 11/28/15 0328 11/29/15 0342  LATICACIDVEN 2.0*  --   --   --   PROCALCITON 4.95 2.12 0.94 0.70    ABG  Recent Labs Lab 11/26/15 0435 11/27/15 0428 11/29/15 0445  PHART 7.348* 7.340* 7.432  PCO2ART 74.9* 78.0* 71.8*  PO2ART 89.5 66.5* 104*    Liver Enzymes  Recent Labs Lab 11/23/15 0353 11/28/15 0324 11/29/15 0342  ALBUMIN 2.9* 2.4* 2.4*    Cardiac Enzymes No results for input(s): TROPONINI, PROBNP in the last 168 hours.  Glucose  Recent Labs Lab 11/28/15 0824 11/28/15 1233 11/28/15 1617 11/28/15 2003 11/29/15 0033 11/29/15 0351  GLUCAP 162* 128* 125* 125* 151* 143*    Imaging No results found.   STUDIES:  PSG 03/03/15: AHI 108, SpO2 low 60%.  CPAP 16 >> AHI  0 Doppler Lt leg 11/23/15: negative for DVT, limited study TTE 11/24/15: mod LVH, EF 65 to 70%, mod RV dilation, mod TR, PAS 85 mmHg Port CXR 8/4:  ETT & Enteric tubes in good position. Silhouetting left hemidiaphragm with some air bronchograms suggestive of consolidation. Blunting of right costophrenic angle along with fluid within right fissure suggestive of pleural effusion.  MICROBIOLOGY: Urine Ctx 7/30:  Multiple Species Blood Ctx x2 7/30:  Negative  MRSA PCR 7/30:  Negative  Tracheal Asp Ctx 8/4 >> Blood Ctx x2 8/4 >> Tracheal Asp Ctx 8/5 >>  ANTIBIOTICS: Aztreonam 7/29 - 7/30 Flagyl 7/29 - 7/30 Rocephin 7/32 - 8/1 Vancomycin 7/29 >> Cefepime 8/1 >>   SIGNIFICANT EVENTS: 7/29 Admit 8/01  VDRF  LINES/TUBES: OETT 8/1 - 8/2 (self extubation); 8/2 (reintubated by anesthesia w/ difficult airway) >> NGT 8/1 >> FOLEY >> PIV x2  ASSESSMENT / PLAN:  PULMONARY A: Acute on Chronic Hypoxic Respiratory Failure - Presumed diastolic CHF. Acute on Chronic Hypercarbic Respiratory Failure HCAP OSA/OHS Narcolepsy EXTREMELY DIFFICULTY AIRWAY Possible COPD/Asthma Tobacco Use Disorder  P: Full Vent Support Holding on SBT & WUA until ventilator requirement improves significantly Weaning FiO2 for Sat 88-92% Holding on PEEP wean until FiO2 </= 0.4 Duoneb q6hr Continuing Lasix 20mg  IV q12hr Nicotine 7mg  Patch  CARDIAC A: Acute Cor Pulmonale/RV Failure Acute on Chronic Diastolic CHF Hypotension - Resolved. Secondary to sedation. H/O Hypertension  P: Monitoring on Telemetry Vitals per unit protocol Continuing Lasix diuresis Goal MAP > 65 Holding outpatient Lisinopril  RENAL A: Acute Renal Failure - Resolved. Hyperkalemia - Mild w/ slight hemolysis on 8/4. Now resolved. Metabolic Alkalosis - Likely contraction. Compensated.  P: Trending UOP with Foley Monitoring electrolytes & renal funciton daily Replacing electrolytes as indicated Diamox 250mg  VT x1  GASTROENTEROLOGY A: No BM  P: Continuing Tube Feedings Protonix VT daily Senna & Colace VT bid  HEMATOLOGY A: Leukocytosis - Resolved. Likely secondary to sepsis.  P: Trending cell counts daily w/ CBC Heparin [place] q8hr SCDs  INFECTION A: Sepsis Left Leg Cellulitis w/ 2 Blisters - Mild. LLL HCAP  P: Continuing Empiric Vancomycin Day #9 & Cefepime Day #6 Trending Procalcitonin per algorithm - Improving. Awaiting culture results  ENDOCRINE A: H/O DM Type 2 - Glucose controlled.  P: Accu-Checks q4hr SSI per Resistant Algorithm Holding outpatient glucotrol & glucophage  NEUROLOGY A: Acute Encephalopathy - Multifactorial w/ metabolic, hypercarbic, & hypoxic. Sedation on Ventilator Chronic  Outpatient Oxycodone & Xanax H/O Diabetic Neuropathy H/O Bipolar Disorder H/O PTSD  P: RASS goal: -1 to -2 Fentanyl gtt & IV prn Pain Versed IV prn Sedation Propofol gtt Hold outpt xanax & oxycodone Neurontin 300mg  bid  FAMILY UPDATE:  Mother updated by Dr. Jamison NeighborNestor via phone 8/5 & other family at bedside on 8/5.   TODAY'S SUMMARY:  35 y/o male admitted with sepsis from Lt leg cellulitis.  Has hx of tobacco abuse and severe obstructive sleep apnea.  He has been non compliant with CPAP as outpt.  Continuing more gentle diuresis with decreased dose of Lasix. Giving 1 dose of Diamox today for contraction alkalosis. Continuing broad-spectrum antibiotics.  I spent a total of 31 minutes of critical care time today caring for the patient and reviewing the patient's electronic medical record.  Donna ChristenJennings E. Jamison NeighborNestor, M.D. [facility] Pager:  [phone] After 3pm or if no response, call [phone] 11/29/2015, 8:42 AM

## 2015-11-30 ENCOUNTER — Inpatient Hospital Stay (HOSPITAL_COMMUNITY): Payer: Medicaid Other

## 2015-11-30 LAB — CBC WITH DIFFERENTIAL/PLATELET
BASOS PCT: 0 %
Basophils Absolute: 0 10*3/uL (ref 0.0–0.1)
EOS ABS: 0.4 10*3/uL (ref 0.0–0.7)
Eosinophils Relative: 4 %
HCT: 44.4 % (ref 39.0–52.0)
HEMOGLOBIN: 13.2 g/dL (ref 13.0–17.0)
Lymphocytes Relative: 17 %
Lymphs Abs: 1.8 10*3/uL (ref 0.7–4.0)
MCH: 27.6 pg (ref 26.0–34.0)
MCHC: 29.7 g/dL — ABNORMAL LOW (ref 30.0–36.0)
MCV: 92.7 fL (ref 78.0–100.0)
Monocytes Absolute: 1.1 10*3/uL — ABNORMAL HIGH (ref 0.1–1.0)
Monocytes Relative: 11 %
NEUTROS PCT: 68 %
Neutro Abs: 7 10*3/uL (ref 1.7–7.7)
PLATELETS: 265 10*3/uL (ref 150–400)
RBC: 4.79 MIL/uL (ref 4.22–5.81)
RDW: 14.8 % (ref 11.5–15.5)
WBC: 10.3 10*3/uL (ref 4.0–10.5)

## 2015-11-30 LAB — URINALYSIS, ROUTINE W REFLEX MICROSCOPIC
BILIRUBIN URINE: NEGATIVE
Glucose, UA: NEGATIVE mg/dL
HGB URINE DIPSTICK: NEGATIVE
KETONES UR: NEGATIVE mg/dL
Leukocytes, UA: NEGATIVE
Nitrite: NEGATIVE
PROTEIN: NEGATIVE mg/dL
Specific Gravity, Urine: 1.022 (ref 1.005–1.030)
pH: 8 (ref 5.0–8.0)

## 2015-11-30 LAB — CULTURE, RESPIRATORY W GRAM STAIN

## 2015-11-30 LAB — GLUCOSE, CAPILLARY
GLUCOSE-CAPILLARY: 120 mg/dL — AB (ref 65–99)
GLUCOSE-CAPILLARY: 126 mg/dL — AB (ref 65–99)
GLUCOSE-CAPILLARY: 133 mg/dL — AB (ref 65–99)
GLUCOSE-CAPILLARY: 143 mg/dL — AB (ref 65–99)
GLUCOSE-CAPILLARY: 144 mg/dL — AB (ref 65–99)
Glucose-Capillary: 137 mg/dL — ABNORMAL HIGH (ref 65–99)
Glucose-Capillary: 140 mg/dL — ABNORMAL HIGH (ref 65–99)

## 2015-11-30 LAB — RENAL FUNCTION PANEL
ALBUMIN: 2.5 g/dL — AB (ref 3.5–5.0)
ANION GAP: 7 (ref 5–15)
BUN: 45 mg/dL — ABNORMAL HIGH (ref 6–20)
CALCIUM: 9.1 mg/dL (ref 8.9–10.3)
CO2: 39 mmol/L — AB (ref 22–32)
Chloride: 94 mmol/L — ABNORMAL LOW (ref 101–111)
Creatinine, Ser: 1.11 mg/dL (ref 0.61–1.24)
GFR calc Af Amer: >60 mL/min (ref >60)
GFR calc non Af Amer: >60 mL/min (ref >60)
Glucose, Bld: 148 mg/dL — ABNORMAL HIGH (ref 65–99)
PHOSPHORUS: 6 mg/dL — AB (ref 2.5–4.6)
Potassium: 4.2 mmol/L (ref 3.5–5.1)
SODIUM: 140 mmol/L (ref 135–145)

## 2015-11-30 LAB — CULTURE, RESPIRATORY

## 2015-11-30 LAB — MAGNESIUM: MAGNESIUM: 2.3 mg/dL (ref 1.7–2.4)

## 2015-11-30 MED ORDER — PRO-STAT SUGAR FREE PO LIQD
60.0000 mL | Freq: Two times a day (BID) | ORAL | Status: DC
  Administered 2015-12-01 (×2): 60 mL
  Filled 2015-11-30 (×3): qty 60

## 2015-11-30 MED ORDER — VITAL HIGH PROTEIN PO LIQD
1000.0000 mL | ORAL | Status: DC
  Administered 2015-11-30 – 2015-12-01 (×3): 1000 mL
  Filled 2015-11-30 (×3): qty 1000

## 2015-11-30 MED ORDER — DIPHENHYDRAMINE HCL 50 MG/ML IJ SOLN
50.0000 mg | Freq: Four times a day (QID) | INTRAMUSCULAR | Status: DC
  Administered 2015-11-30 – 2015-12-04 (×16): 50 mg via INTRAVENOUS
  Filled 2015-11-30 (×16): qty 1

## 2015-11-30 MED ORDER — OLOPATADINE HCL 0.1 % OP SOLN
1.0000 [drp] | Freq: Two times a day (BID) | OPHTHALMIC | Status: DC
  Administered 2015-11-30 – 2015-12-07 (×15): 1 [drp] via OPHTHALMIC
  Filled 2015-11-30: qty 5

## 2015-11-30 MED ORDER — VITAL HIGH PROTEIN PO LIQD: 1000.0000 mL | ORAL | Status: DC

## 2015-11-30 MED ORDER — FLUTICASONE PROPIONATE 50 MCG/ACT NA SUSP
2.0000 | Freq: Every day | NASAL | Status: DC
  Administered 2015-12-01 (×2): 2 via NASAL
  Filled 2015-11-30: qty 16

## 2015-11-30 MED ORDER — FLEET ENEMA 7-19 GM/118ML RE ENEM
1.0000 | ENEMA | Freq: Once | RECTAL | Status: AC
  Administered 2015-11-30: 1 via RECTAL
  Filled 2015-11-30: qty 1

## 2015-11-30 MED ORDER — ACETAZOLAMIDE 250 MG PO TABS
250.0000 mg | ORAL_TABLET | Freq: Once | ORAL | Status: AC
  Administered 2015-11-30: 250 mg
  Filled 2015-11-30: qty 1

## 2015-11-30 NOTE — Progress Notes (Signed)
Pt eye and eyelid extremely swollen. Tissue exposed. Lacrilube used and saline gauze placed over eyes. MD notified. Will continue to monitor.

## 2015-11-30 NOTE — Progress Notes (Signed)
Pt.has copious amounts of clear nasal, oral, and endotracheal secretions. Large amounts of thick, clear nasal mucous. Pt.has NG tube with continuous tube feeding infusing since prior to start of shift . He also received fleets enema earlier today with no bm result. E-link physician called and notified. New orders were written.

## 2015-11-30 NOTE — Progress Notes (Signed)
PULMONARY / CRITICAL CARE MEDICINE   Name: Kenneth Mcfarland MRN: [id number] DOB: June 14, 1980    ADMISSION DATE:  10/30/2015 CONSULTATION DATE:  11/24/2015  REFERRING MD:  Dr. Robb Matar, [facility]  CHIEF COMPLAINT:  Short of breath  SUBJECTIVE:  No acute events overnight. Patient continuing to have significant scleral edema.  REVIEW OF SYSTEMS:  Unable to obtain with intubation & sedation.  VITAL SIGNS: BP 124/64   Pulse (!) 101   Temp (!) 101.5 F (38.6 C) (Core (Comment))   Resp 16   Ht 5\' 9"  (1.753 m)   Wt (!) 324 lb (147 kg)   SpO2 93%   BMI 47.85 kg/m   HEMODYNAMICS:    VENTILATOR SETTINGS: Vent Mode: PRVC FiO2 (%):  [45 %-50 %] 45 % Set Rate:  [16 bmp] 16 bmp Vt Set:  [570 mL] 570 mL PEEP:  [12 cmH20] 12 cmH20 Plateau Pressure:  [25 cmH20-29 cmH20] 26 cmH20  INTAKE / OUTPUT:  Intake/Output Summary (Last 24 hours) at 11/30/15 1000 Last data filed at 11/30/15 0927  Gross per 24 hour  Intake          2685.47 ml  Output             4875 ml  Net         -2189.53 ml     PHYSICAL EXAMINATION: General: No distress. Girlfriend at bedside. Morbid obesity. Neuro: follows commands. Cooperative. No focal def  HEENT:  Endotracheal tube in place. Notable scleral edema bilateral Tongue is large and protrudes.  Cardiovascular:  Regular rate. Distant heart sounds. Unable to appreciate JVD given body habitus. No edema. Pulmonary: diffuse rhonchi  Abdomen:  Soft. Protuberant. +  bowel sounds. Integument: Warm and dry. No rash on exposed skin. 2 blisters on left lower leg.  LABS:  BMET  Recent Labs Lab 11/28/15 0324 11/29/15 0342 11/30/15 0332  NA 142 140 140  K 4.9 4.7 4.2  CL 92* 91* 94*  CO2 43* 45* 39*  BUN 28* 34* 45*  CREATININE 1.04 0.98 1.11  GLUCOSE 120* 130* 148*    Electrolytes  Recent Labs Lab 11/28/15 0324 11/29/15 0342 11/30/15 0332  CALCIUM 8.9 9.0 9.1  MG 2.1 2.1 2.3  PHOS 4.9* 4.9* 6.0*    CBC  Recent Labs Lab 11/28/15 0324  11/29/15 0342 11/30/15 0332  WBC 9.9 9.8 10.3  HGB 13.1 13.3 13.2  HCT 43.9 43.4 44.4  PLT 262 265 265    Coag's No results for input(s): APTT, INR in the last 168 hours.  Sepsis Markers  Recent Labs Lab 11/25/15 0315 11/26/15 0342 11/28/15 0328 11/29/15 0342  LATICACIDVEN 2.0*  --   --   --   PROCALCITON 4.95 2.12 0.94 0.70    ABG  Recent Labs Lab 11/26/15 0435 11/27/15 0428 11/29/15 0445  PHART 7.348* 7.340* 7.432  PCO2ART 74.9* 78.0* 71.8*  PO2ART 89.5 66.5* 104*    Liver Enzymes  Recent Labs Lab 11/28/15 0324 11/29/15 0342 11/30/15 0332  ALBUMIN 2.4* 2.4* 2.5*    Cardiac Enzymes No results for input(s): TROPONINI, PROBNP in the last 168 hours.  Glucose  Recent Labs Lab 11/29/15 1124 11/29/15 1617 11/29/15 1919 11/29/15 2358 11/30/15 0328 11/30/15 0759  GLUCAP 127* 126* 111* 143* 137* 140*    Imaging No results found.   STUDIES:  PSG 03/03/15: AHI 108, SpO2 low 60%.  CPAP 16 >> AHI 0 Doppler Lt leg 11/23/15: negative for DVT, limited study TTE 11/24/15: mod LVH, EF 65 to 70%, mod  RV dilation, mod TR, PAS 85 mmHg Port CXR 8/4:  ETT & Enteric tubes in good position. Silhouetting left hemidiaphragm with some air bronchograms suggestive of consolidation. Blunting of right costophrenic angle along with fluid within right fissure suggestive of pleural effusion.  MICROBIOLOGY: Urine Ctx 7/30:  Multiple Species Blood Ctx x2 7/30:  Negative  MRSA PCR 7/30:  Negative  Tracheal Asp Ctx 8/4: rare budding yeast >>> Blood Ctx x2 8/4 >> Tracheal Asp Ctx 8/5 >> Blood Ctx x2 8/7 >> Tracheal Asp Ctx 8/7 >>  ANTIBIOTICS: Aztreonam 7/29 - 7/30 Flagyl 7/29 - 7/30 Rocephin 7/32 - 8/1 Vancomycin 7/29 - 8/7 Cefepime 8/1 - 8/7  SIGNIFICANT EVENTS: 7/29 Admit 8/01 VDRF  LINES/TUBES: OETT 8/1 - 8/2 (self extubation); 8/2 (reintubated by anesthesia w/ difficult airway) >> NGT 8/1 >> FOLEY >> PIV x2  ASSESSMENT / PLAN:  PULMONARY A: Acute on  Chronic Hypoxic Respiratory Failure - Presumed diastolic CHF. Acute on Chronic Hypercarbic Respiratory Failure HCAP OSA/OHS Narcolepsy EXTREMELY DIFFICULTY AIRWAY Possible COPD/Asthma Tobacco Use Disorder  P: Full Vent Support Holding on SBT & WUA until ventilator requirement improves significantly Weaning FiO2 for Sat 88-92% Decrease PEEP to 10  Duoneb q6hr Continuing Lasix  IV q12hr Nicotine  Patch  CARDIAC A: Acute Cor Pulmonale/RV Failure Acute on Chronic Diastolic CHF Hypotension - Resolved. Secondary to sedation. H/O Hypertension  P: Monitoring on Telemetry Vitals per unit protocol Continuing Lasix diuresis Goal MAP > 65 Holding outpatient Lisinopril  RENAL A: Hyperphosphatemia - Mild. Uremia - Mild. Likely due to diuresis. Acute Renal Failure - Resolved. Hyperkalemia - Mild w/ slight hemolysis on 8/4. Now resolved. Metabolic Alkalosis - Likely contraction. Compensated.  P: Trending UOP with Foley Monitoring electrolytes & renal funciton daily Replacing electrolytes as indicated Diamox  VT x1 (again 8/7)  GASTROENTEROLOGY A: No BM  P: Continuing Tube Feedings Protonix VT daily Senna & Colace VT bid Fleet Enema x1  HEMATOLOGY A: Leukocytosis - Resolved. Likely secondary to sepsis.  P: Trending cell counts daily w/ CBC Heparin [place] q8hr SCDs  INFECTION A: Sepsis Left Leg Cellulitis w/ 2 Blisters - Mild. LLL HCAP Mild fever... No bump in WBC.  P: Dc abx Trending Procalcitonin per algorithm - Improving.  ENDOCRINE A: H/O DM Type 2 - Glucose controlled.  P: Accu-Checks q4hr SSI per Resistant Algorithm Holding outpatient glucotrol & glucophage  NEUROLOGY A: Acute Encephalopathy - Multifactorial w/ metabolic, hypercarbic, & hypoxic. Sedation on Ventilator Chronic Outpatient Oxycodone & Xanax H/O Diabetic Neuropathy H/O Bipolar Disorder H/O PTSD  P: RASS goal: -1 to -2 Fentanyl gtt & IV prn Pain Versed IV prn  Sedation Propofol gtt Hold outpt xanax & oxycodone Neurontin  bid  HEENT: A: Severe scleral edema vs allergic conjunctivitis  Glossitis - He has PCN allergy.   P: Dc abx Add  H1 blockade AND via eye gtts.  Keep HOB elevated  FAMILY UPDATE:  Mother updated by Dr. Jamison Neighbor via phone 8/5. Girlfriend updated at bedside 8/7.   TODAY'S SUMMARY:   35 y/o male admitted with sepsis from Lt leg cellulitis.  Has hx of tobacco abuse and severe obstructive sleep apnea.  He has been non compliant with CPAP as outpt. Plan for today: continue aggressive diuresis, will repeat diamox x1, decreased Peep to 10. Hope ready to wean soon. He has significant glossitis as well as scleral edema. I wonder if these could actually reflect allergic reaction of some kind. He has documented h./o PCN allergy. We will stop abx as his  WBC ct is nml and his PCT is now normal. Have also started empiric scheduled benadryl and patanol eye gtt.s   Simonne MartinetPeter E Babcock ACNP-BC [facility] Pager # [phone] OR # [phone] if no answer  PCCM Attending Note: Patient seen and examined with nurse practitioner. Please refer to his progress note which I have reviewed in detail. Discontinuing antibiotics today. Given concern for possible angioedema and allergic reaction. Continuing diuresis. Continuing to cautiously wean ventilator support. Administering Fleet enema today for constipation. Continued monitoring of renal function with further diuresis. Reculturing today for persistent fever although this could also be due to his potential medication allergy.  I have spent a total of 34 minutes of critical care time today caring for the patient, updating girlfriend at bedside, and reviewing the patient's electronic medical record.  Donna ChristenJennings E. Jamison NeighborNestor, M.D. [facility] Pager:  [phone] After 3pm or if no response, call [phone] 1:57 PM 11/30/15

## 2015-11-30 NOTE — Progress Notes (Signed)
eLink Physician-Brief Progress Note Patient Name: Kenneth DonningJamaine Schaafsma DOB: 17-Oct-1980 MRN: [id number]     Dg Abd Portable 1v  Result Date: 11/30/2015 CLINICAL DATA:  OG tube placement. EXAM: PORTABLE ABDOMEN - 1 VIEW COMPARISON:  None. FINDINGS: The tip and side port of the enteric tube are below the diaphragm in the stomach. Air-filled colon in the upper abdomen. No free air. IMPRESSION: Tip and side port of the enteric tube below the diaphragm in the stomach. Electronically Signed   By: Rubye OaksMelanie  Ehinger M.D.   On: 11/30/2015 22:46    Ok to use OGT RN to continue holding TF per order - reassess by bedside MD in AM 12/01/15; conmfusing note v order    Intervention Category Major Interventions: Other:  Noely Kuhnle 11/30/2015, 11:45 PM

## 2015-11-30 NOTE — Progress Notes (Signed)
Nutrition Follow-up  DOCUMENTATION CODES:   Morbid obesity  INTERVENTION:  - Will change TF regimen based on current Propofol rate: Vital High Protein @ 40 mL/hr with 60 mL Prostat BID. This regimen + kcal from Propofol will provide 2060 kcal, 144 grams of protein, and 803 mL free water. - Continue PEPUP protocol. - Free water per MD/NP given need for Lasix, no BM since admission.  - RD will follow-up 8/9.  NUTRITION DIAGNOSIS:   Inadequate oral intake related to inability to eat as evidenced by NPO status. -ongoing  GOAL:   Provide needs based on ASPEN/SCCM guidelines -met with current regimen.  MONITOR:   Vent status, TF tolerance, Weight trends, Labs, Skin, I & O's  ASSESSMENT:   This is a 35 year old gentleman with multiple medical issues including diabetes. He states he woke up after midnight tonight and had pain in his left lower extremity. He came to the ER. He denies any recent trauma, stating the scarring on the left lower extremity are old scars.  8/7 No new weight since 11/23/15. Pt continues with NGT and currently receiving Vital High Protein @ 50 mL/hr with 30 mL Prostat TID. This regimen is providing 1500 kcal, 150 grams of protein, and 1003 mL free water.  Patient is currently intubated on ventilator support MV: 8.7 L/min Temp (24hrs), Avg:101.4 F (38.6 C), Min:100.9 F (38.3 C), Max:101.8 F (38.8 C) Propofol: 26.5 ml/hr (700 kcal)  Will change TF regimen as outlined above and follow-up 8/9. No BM since admission.  Medications reviewed; 20 mg IV Lasix BID, sliding scale Novolog, PRN IV Zofran, 40 mg Protonix/day, 5 mL Senokot BID, 100 mg Colace BID. Labs reviewed; CBGs: 137 and 140 mg/dL this AM, Cl: 94 mmol/L, BUN: 45 mg/dL, Phos: 6 mg/dL.  Drips: Propofol @ 30 mcg/kg/min, Fentanyl @ 225 mcg/hr.    8/4 - Pt currently receiving Vital High Protein @ 60 mL/hr with 30 mL Prostat BID which is providing 1640 kcal, 156 grams of protein, and 1204 mL free  water. Will change regimen to Vital High Protein to 50 mL/hr with 30 mL Prostat TID d/t current Propofol rate.   Patient is currently intubated on ventilator support MV: 8.1 L/min Temp (24hrs), Avg:100.9 F (38.3 C), Min:100.6 F (38.1 C), Max:101.3 F (38.5 C) Propofol: 20.3 ml/hr (536 kcal).  Drips: Propofol @ 23 mcg/kg/min, Fentanyl '@200'$  mcg/hr (d/t increased agitation overnight and need to increase sedation).   8/3 - Estimated protein needs updated this AM based on ASPEC/CCM guidelines for morbid obesity and based on current medical dx/medical course.   Patient is currently intubated on ventilator support MV: 9.6L/min Temp (24hrs), Avg:100.3 F (37.9 C), Min:99.3 F (37.4 C), Max:100.6 F (38.1 C) Propofol: 10.63m/hr (280 kcal).  IVF:NS @ 50 mL/hr.  Drips: Propofol @ 12 mcg/min, Fentanyl @ 150 mcg/hr.     Diet Order:  Diet NPO time specified  Skin:  Reviewed, no issues  Last BM:  PTA  Height:   Ht Readings from Last 1 Encounters:  11/29/15 '5\' 9"'$  (1.753 m)    Weight:   Wt Readings from Last 1 Encounters:  11/23/15 (!) 324 lb (147 kg)    Ideal Body Weight:  72.72 kg  BMI:  Body mass index is 47.85 kg/m.  Estimated Nutritional Needs:   Kcal:  [address] Protein:  145-160 grams (2-2.2 grams/kg IBW)  Fluid:  >/= 1.6L  EDUCATION NEEDS:   No education needs identified at this time    JJarome Matin MS, RD, LDN  Inpatient Clinical Dietitian Pager # [phone] After hours/weekend pager # [phone]

## 2015-11-30 NOTE — Progress Notes (Signed)
eLink Physician-Brief Progress Note Patient Name: Kenneth DonningJamaine Borchard DOB: 01/21/1981 MRN: [id number]   Date of Service  11/30/2015  HPI/Events of Note  Multiple issues: 1. Copious nasal and oral secretions. Patient has NGT and 2. Constipation.   eICU Interventions  Will order: 1. D/C NGT. 2. Place OGT. 3. Flonase 2 sprays daily.  4. Fleets enema now.      Intervention Category Intermediate Interventions: Other:  Lenell AntuSommer,Steven Eugene 11/30/2015, 9:24 PM

## 2015-11-30 NOTE — Progress Notes (Signed)
eLink Physician-Brief Progress Note Patient Name: Kenneth DonningJamaine Mcfarland DOB: 12/23/1980 MRN: [id number]   Date of Service  11/30/2015  HPI/Events of Note  - eyelid v scleral edema - very severe    eICU Interventions  Bedside team > 7 am to assess     Intervention Category Intermediate Interventions: Other:  Steffany Schoenfelder 11/30/2015, 5:40 AM

## 2015-11-30 NOTE — Progress Notes (Signed)
OG tube placed per MD order. Abdominal x-ray done to confirm placement. [facility] and compared with notes written from CCM and dietician. Called E-link and notified physician  abdominal x-ray imaging/results complete. Also notified E-link physician of MAR order for continuous tube feeding and notes from CCM and dietician. Was told to continue to hold tube feeding for now.

## 2015-12-01 ENCOUNTER — Inpatient Hospital Stay (HOSPITAL_COMMUNITY): Payer: Medicaid Other

## 2015-12-01 DIAGNOSIS — R509 Fever, unspecified: Secondary | ICD-10-CM

## 2015-12-01 LAB — GLUCOSE, CAPILLARY
GLUCOSE-CAPILLARY: 149 mg/dL — AB (ref 65–99)
GLUCOSE-CAPILLARY: 124 mg/dL — AB (ref 65–99)
GLUCOSE-CAPILLARY: 107 mg/dL — AB (ref 65–99)
Glucose-Capillary: 124 mg/dL — ABNORMAL HIGH (ref 65–99)
Glucose-Capillary: 153 mg/dL — ABNORMAL HIGH (ref 65–99)
Glucose-Capillary: 139 mg/dL — ABNORMAL HIGH (ref 65–99)

## 2015-12-01 LAB — COMPREHENSIVE METABOLIC PANEL
ALBUMIN: 2.6 g/dL — AB (ref 3.5–5.0)
ALT: 45 U/L (ref 17–63)
AST: 73 U/L — AB (ref 15–41)
Alkaline Phosphatase: 81 U/L (ref 38–126)
Anion gap: 8 (ref 5–15)
BUN: 47 mg/dL — AB (ref 6–20)
CHLORIDE: 97 mmol/L — AB (ref 101–111)
CO2: 36 mmol/L — ABNORMAL HIGH (ref 22–32)
Calcium: 9.3 mg/dL (ref 8.9–10.3)
Creatinine, Ser: 1.25 mg/dL — ABNORMAL HIGH (ref 0.61–1.24)
GFR calc Af Amer: >60 mL/min (ref >60)
Glucose, Bld: 131 mg/dL — ABNORMAL HIGH (ref 65–99)
POTASSIUM: 4.1 mmol/L (ref 3.5–5.1)
Sodium: 141 mmol/L (ref 135–145)
Total Bilirubin: 0.4 mg/dL (ref 0.3–1.2)
Total Protein: 9.3 g/dL — ABNORMAL HIGH (ref 6.5–8.1)

## 2015-12-01 LAB — CK TOTAL AND CKMB (NOT AT ARMC)
CK, MB: 3.2 ng/mL (ref 0.5–5.0)
RELATIVE INDEX: 0.4 (ref 0.0–2.5)
Total CK: 717 U/L — ABNORMAL HIGH (ref 49–397)

## 2015-12-01 LAB — BLOOD GAS, ARTERIAL
ACID-BASE EXCESS: 10.5 mmol/L — AB (ref 0.0–2.0)
Bicarbonate: 38.1 mEq/L — ABNORMAL HIGH (ref 20.0–24.0)
DRAWN BY: 347671
FIO2: 40
LHR: 16 {breaths}/min
O2 SAT: 94.4 %
PEEP/CPAP: 12 cmH2O
PH ART: 7.383 (ref 7.350–7.450)
Patient temperature: 98.6
TCO2: 33.7 mmol/L (ref 0–100)
VT: 570 mL
pCO2 arterial: 65.4 mmHg (ref 35.0–45.0)
pO2, Arterial: 84.3 mmHg (ref 80.0–100.0)

## 2015-12-01 LAB — TRIGLYCERIDES: TRIGLYCERIDES: 250 mg/dL — AB (ref <150)

## 2015-12-01 LAB — CBC
HCT: 46.8 % (ref 39.0–52.0)
Hemoglobin: 14.3 g/dL (ref 13.0–17.0)
MCH: 28.4 pg (ref 26.0–34.0)
MCHC: 30.6 g/dL (ref 30.0–36.0)
MCV: 92.9 fL (ref 78.0–100.0)
PLATELETS: 248 10*3/uL (ref 150–400)
RBC: 5.04 MIL/uL (ref 4.22–5.81)
RDW: 15 % (ref 11.5–15.5)
WBC: 12 10*3/uL — AB (ref 4.0–10.5)

## 2015-12-01 LAB — CULTURE, RESPIRATORY W GRAM STAIN

## 2015-12-01 LAB — PROCALCITONIN: PROCALCITONIN: 0.51 ng/mL

## 2015-12-01 LAB — CULTURE, RESPIRATORY

## 2015-12-01 LAB — LACTIC ACID, PLASMA: Lactic Acid, Venous: 0.9 mmol/L (ref 0.5–1.9)

## 2015-12-01 LAB — LIPASE, BLOOD: Lipase: 21 U/L (ref 11–51)

## 2015-12-01 MED ORDER — SORBITOL 70 % SOLN
960.0000 mL | TOPICAL_OIL | Freq: Once | ORAL | Status: AC
  Administered 2015-12-01: 960 mL via RECTAL
  Filled 2015-12-01: qty 240

## 2015-12-01 MED ORDER — SODIUM CHLORIDE 0.9 % IV SOLN
1.0000 mg/h | INTRAVENOUS | Status: DC
  Administered 2015-12-01 (×2): 7.5 mg/h via INTRAVENOUS
  Administered 2015-12-01: 1 mg/h via INTRAVENOUS
  Administered 2015-12-02 – 2015-12-03 (×3): 7.5 mg/h via INTRAVENOUS
  Filled 2015-12-01 (×8): qty 10

## 2015-12-01 MED ORDER — MIDAZOLAM BOLUS VIA INFUSION
1.0000 mg | INTRAVENOUS | Status: DC | PRN
  Administered 2015-12-01 – 2015-12-03 (×8): 2 mg via INTRAVENOUS
  Filled 2015-12-01: qty 4

## 2015-12-01 NOTE — Care Management Note (Signed)
Case Management Note  Patient Details  Name: Murvin DonningJamaine Royle MRN: [id number] Date of Birth: [birth year]  Subjective/Objective: 35 y/o m admitted w/L leg cellulitis, VDRF. Full vent support. From home.Financial counselor-following for medicaid potential.                   Action/Plan:d/c plan home.   Expected Discharge Date:                  Expected Discharge Plan:  Home/Self Care  In-House Referral:     Discharge planning Services  CM Consult  Post Acute Care Choice:    Choice offered to:     DME Arranged:    DME Agency:     HH Arranged:    HH Agency:     Status of Service:  In process, will continue to follow  If discussed at Long Length of Stay Meetings, dates discussed:    Additional Comments:  Lanier ClamMahabir, Angi Goodell, RN 12/01/2015, 11:07 AM

## 2015-12-01 NOTE — Progress Notes (Addendum)
eLink Physician-Brief Progress Note Patient Name: Kenneth DonningJamaine Qian DOB: Apr 13, 1981 MRN: [id number]   RN says febrile to 102.38F - similar to 2d ago but having fever for most of stay since 11/23/2015 . Patient on tylenol Q4h; last dose 2.5h ago. On diprivan   Recent Labs Lab 11/25/15 0315 11/26/15 0342 11/28/15 0328 11/29/15 0342  LATICACIDVEN 2.0*  --   --   --   PROCALCITON 4.95 2.12 0.94 0.70     Recent Labs Lab 11/26/15 0342 11/26/15 1809 11/27/15 0414 11/27/15 1555 11/28/15 0324 11/29/15 0342 11/30/15 0332  NA 141  --  143 139 142 140 140  K 4.5  --  5.0 5.2* 4.9 4.7 4.2  CL 98*  --  97* 92* 92* 91* 94*  CO2 37*  --  41* 42* 43* 45* 39*  GLUCOSE 150*  --  147* 153* 120* 130* 148*  BUN 26*  --  22* 23* 28* 34* 45*  CREATININE 0.98  --  0.97 0.98 1.04 0.98 1.11  CALCIUM 8.5*  --  8.6* 8.5* 8.9 9.0 9.1  MG 1.9 2.0  --   --  2.1 2.1 2.3  PHOS 3.5 4.4  --   --  4.9* 4.9* 6.0*     A) Fever with reducing wbc - unclear cauyse. Not on abx curently  P Check hiv Check PCT, lactate, also CK, lipase, - for diprivan infuson sydnrome Cooling blnaket        [name] 12/01/2015, 2:38 AM

## 2015-12-01 NOTE — Progress Notes (Signed)
PULMONARY / CRITICAL CARE MEDICINE   Name: Kenneth Mcfarland MRN: [id number] DOB: [birth year]    ADMISSION DATE:  03-Dec-2015 CONSULTATION DATE:  11/24/2015  REFERRING MD:  Dr. Robb Matarrtiz, [facility]  CHIEF COMPLAINT:  Short of breath  SUBJECTIVE:  No acute events overnight. Patient still having fevers.  REVIEW OF SYSTEMS:  Unable to obtain with intubation & sedation.  VITAL SIGNS: BP (!) 114/58   Pulse 96   Temp (!) 101.7 F (38.7 C) (Axillary)   Resp 16   Ht 5\' 9"  (1.753 m)   Wt 295 lb (133.8 kg)   SpO2 99%   BMI 43.56 kg/m   HEMODYNAMICS:    VENTILATOR SETTINGS: Vent Mode: PRVC FiO2 (%):  [40 %-45 %] 40 % Set Rate:  [15 bmp-16 bmp] 16 bmp Vt Set:  [570 mL] 570 mL PEEP:  [10 cmH20-12 cmH20] 12 cmH20 Plateau Pressure:  [16 cmH20-27 cmH20] 24 cmH20  INTAKE / OUTPUT:  Intake/Output Summary (Last 24 hours) at 12/01/15 0744 Last data filed at 12/01/15 0700  Gross per 24 hour  Intake           1999.7 ml  Output             1725 ml  Net            274.7 ml     PHYSICAL EXAMINATION: General: No distress. No family at bedside. Morbid obesity. Neuro: Sedated. Not following commands.   HEENT:  Endotracheal tube in place. Worsening scleral edema. Continued tongue swelling/glossitis.  Cardiovascular:  Regular rate. No edema. Unable to appreciate JVD. Pulmonary: Overall clear bilaterally to auscultation. Symmetric chest wall rise on ventilator.  Abdomen:  Soft. Protuberant. Hypoactive bowel sounds. Integument: Warm and dry. No rash on exposed skin. Dressings in place over left lower extremity.  LABS:  BMET  Recent Labs Lab 11/29/15 0342 11/30/15 0332 12/01/15 0300  NA 140 140 141  K 4.7 4.2 4.1  CL 91* 94* 97*  CO2 45* 39* 36*  BUN 34* 45* 47*  CREATININE 0.98 1.11 1.25*  GLUCOSE 130* 148* 131*    Electrolytes  Recent Labs Lab 11/28/15 0324 11/29/15 0342 11/30/15 0332 12/01/15 0300  CALCIUM 8.9 9.0 9.1 9.3  MG 2.1 2.1 2.3  --   PHOS 4.9* 4.9* 6.0*  --      CBC  Recent Labs Lab 11/29/15 0342 11/30/15 0332 12/01/15 0300  WBC 9.8 10.3 12.0*  HGB 13.3 13.2 14.3  HCT 43.4 44.4 46.8  PLT 265 265 248    Coag's No results for input(s): APTT, INR in the last 168 hours.  Sepsis Markers  Recent Labs Lab 11/25/15 0315  11/28/15 0328 11/29/15 0342 12/01/15 0250  LATICACIDVEN 2.0*  --   --   --  0.9  PROCALCITON 4.95  < > 0.94 0.70 0.51  < > = values in this interval not displayed.  ABG  Recent Labs Lab 11/27/15 0428 11/29/15 0445 12/01/15 0402  PHART 7.340* 7.432 7.383  PCO2ART 78.0* 71.8* 65.4*  PO2ART 66.5* 104* 84.3    Liver Enzymes  Recent Labs Lab 11/29/15 0342 11/30/15 0332 12/01/15 0300  AST  --   --  73*  ALT  --   --  45  ALKPHOS  --   --  81  BILITOT  --   --  0.4  ALBUMIN 2.4* 2.5* 2.6*    Cardiac Enzymes No results for input(s): TROPONINI, PROBNP in the last 168 hours.  Glucose  Recent Labs Lab 11/30/15 1121 11/30/15  1611 11/30/15 2009 11/30/15 2326 12/01/15 0327 12/01/15 0612  GLUCAP 133* 126* 144* 120* 149* 124*    Imaging Dg Chest Port 1 View  Result Date: 12/01/2015 CLINICAL DATA:  Pulmonary infiltrates. EXAM: PORTABLE CHEST 1 VIEW COMPARISON:  11/27/2015. FINDINGS: Endotracheal tube and NG tube in stable position. Cardiomegaly with improving bilateral pulmonary infiltrates consistent with improving pulmonary edema. Low lung volumes. No pleural effusion or pneumothorax. IMPRESSION: 1. Lines and tubes in stable position. 2. Cardiomegaly with improving bilateral pulmonary infiltrates consistent with improving pulmonary edema. Electronically Signed   By: Maisie Fus  Register   On: 12/01/2015 07:10   Dg Abd Portable 1v  Result Date: 11/30/2015 CLINICAL DATA:  OG tube placement. EXAM: PORTABLE ABDOMEN - 1 VIEW COMPARISON:  None. FINDINGS: The tip and side port of the enteric tube are below the diaphragm in the stomach. Air-filled colon in the upper abdomen. No free air. IMPRESSION: Tip and side  port of the enteric tube below the diaphragm in the stomach. Electronically Signed   By: Rubye Oaks M.D.   On: 11/30/2015 22:46     STUDIES:  PSG 03/03/15: AHI 108, SpO2 low 60%.  CPAP 16 >> AHI 0 Doppler Lt leg 11/23/15: negative for DVT, limited study TTE 11/24/15: mod LVH, EF 65 to 70%, mod RV dilation, mod TR, PAS 85 mmHg Port CXR 8/4:  ETT & Enteric tubes in good position. Silhouetting left hemidiaphragm with some air bronchograms suggestive of consolidation. Blunting of right costophrenic angle along with fluid within right fissure suggestive of pleural effusion. Port CXR 8/8:  ETT in good position. Still with silhouetting of hemidiaphragms but some improvement in interstitial markings. Enteric feeding tube coursing below diaphragm.  MICROBIOLOGY: Urine Ctx 7/30:  Multiple Species Blood Ctx x2 7/30:  Negative  MRSA PCR 7/30:  Negative  Tracheal Asp Ctx 8/4: Few C albicans  Blood Ctx x2 8/4 >> Tracheal Asp Ctx 8/5 >> few yeast Blood Ctx x2 8/7 >> Tracheal Asp Ctx 8/7 >>  ANTIBIOTICS: Aztreonam 7/29 - 7/30 Flagyl 7/29 - 7/30 Rocephin 7/32 - 8/1 Vancomycin 7/29 - 8/7 Cefepime 8/1 - 8/7  SIGNIFICANT EVENTS: 7/29 Admit 8/01 VDRF  LINES/TUBES: OETT 8/1 - 8/2 (self extubation); 8/2 (reintubated by anesthesia w/ difficult airway) >> NGT 8/1 >> FOLEY >> PIV x2  ASSESSMENT / PLAN:  PULMONARY A: Acute on Chronic Hypoxic Respiratory Failure - Presumed diastolic CHF. Acute on Chronic Hypercarbic Respiratory Failure HCAP OSA/OHS Narcolepsy EXTREMELY DIFFICULTY AIRWAY Possible COPD/Asthma Tobacco Use Disorder  P: Full Vent Support Slow weaning of PEEP Weaning FiO2 for Sat 88-92% Decreased PEEP to 10  Continuing Duoneb q6hr Nicotine 7mg  Patch Holding on diuresis w/ slight worsening in renal function  CARDIAC A: Acute Cor Pulmonale/RV Failure Acute on Chronic Diastolic CHF Hypotension - Resolved. Secondary to sedation. H/O Hypertension  P: Monitoring on  Telemetry Vitals per unit protocol Holding on further diuresis Goal MAP > 65 Holding outpatient Lisinopril  RENAL A: Hyperphosphatemia - Mild. Uremia - Worsening w/ diuresis. Acute Renal Failure - Resolved. Hyperkalemia - Mild w/ slight hemolysis on 8/4. Now resolved. Metabolic Alkalosis - Likely contraction. Compensated.  P: Trending UOP with Foley Monitoring electrolytes & renal funciton daily Replacing electrolytes as indicated Holding on further diuresis  GASTROENTEROLOGY A: Constipation - No response with Fleet enema 8/7.  P: Continuing Tube Feedings Protonix VT daily Senna & Colace VT bid Abdominal X-ray now Plan for SMOG Enema depending on KUB  HEMATOLOGY A: Leukocytosis - Mild. Possibly reactive.  P: Trending cell  counts daily w/ CBC Heparin [place] q8hr SCDs  INFECTION A: Sepsis Left Leg Cellulitis w/ 2 Blisters - Mild. LLL HCAP FUO - Possible drug reaction.  P: S/P Course of Vancomycin & Cefepime stopped 8/7 Trending Procalcitonin per algorithm U/A 8/7 negative Repeat Ctx from 8/7 pending Awaiting CK  ENDOCRINE A: H/O DM Type 2 - Glucose controlled.  P: Accu-Checks q4hr SSI per Resistant Algorithm Holding outpatient glucotrol & glucophage  NEUROLOGY A: Acute Encephalopathy - Multifactorial w/ metabolic, hypercarbic, & hypoxic. Sedation on Ventilator Chronic Outpatient Oxycodone & Xanax H/O Diabetic Neuropathy H/O Bipolar Disorder H/O PTSD  P: RASS goal: -1 to -2 Fentanyl gtt & IV prn Pain Starting Versed gtt for sedation & continuing IV prn Weaning Propofol gtt to off Hold outpt xanax & oxycodone Neurontin  VT bid  HEENT: A: Sclera Edema vs Allergic Conjunctivitis Glossitis  P: Continue Benadryl IV q6hr for nother 24 hours Lacrilube bilaterally Patanol drops bilaterally Discontinuing Lacrilube Keep HOB elevated  FAMILY UPDATE:  Mother updated by Dr. Jamison Neighbor via phone 8/5. Girlfriend updated at bedside  8/7.   TODAY'S SUMMARY:   35 y/o male admitted with sepsis from Lt leg cellulitis.  Has hx of tobacco abuse and severe obstructive sleep apnea.  He has been non compliant with CPAP as outpt. Patient's respiratory status steadily improving. Origin of scleral edema/conjunctival edema unclear and possibly secondary to allergy Lacri-Lube. Continuing to monitor cultures in the setting of fever of unknown origin. Procalcitonin only mildly elevated. Awaiting CK. Checking KUB for constipation & anticipate need for SMOG enema.  I have spent a total of 31 minutes of critical care time today caring for the patient, updating girlfriend at bedside, and reviewing the patient's electronic medical record.  Donna Christen Jamison Neighbor, M.D. [facility] Pulmonary & Critical Care Pager:  [phone] After 3pm or if no response, call [phone] 7:44 AM 12/01/15

## 2015-12-02 LAB — CULTURE, RESPIRATORY
CULTURE: NORMAL
SPECIAL REQUESTS: NORMAL

## 2015-12-02 LAB — HIV ANTIBODY (ROUTINE TESTING W REFLEX): HIV Screen 4th Generation wRfx: NONREACTIVE

## 2015-12-02 LAB — CULTURE, BLOOD (ROUTINE X 2)
CULTURE: NO GROWTH
Culture: NO GROWTH

## 2015-12-02 LAB — CBC WITH DIFFERENTIAL/PLATELET
Basophils Absolute: 0 10*3/uL (ref 0.0–0.1)
Basophils Relative: 0 %
EOS PCT: 3 %
Eosinophils Absolute: 0.4 10*3/uL (ref 0.0–0.7)
HEMATOCRIT: 43.6 % (ref 39.0–52.0)
HEMOGLOBIN: 12.7 g/dL — AB (ref 13.0–17.0)
LYMPHS ABS: 2 10*3/uL (ref 0.7–4.0)
LYMPHS PCT: 18 %
MCH: 27.1 pg (ref 26.0–34.0)
MCHC: 29.1 g/dL — ABNORMAL LOW (ref 30.0–36.0)
MCV: 93 fL (ref 78.0–100.0)
Monocytes Absolute: 0.8 10*3/uL (ref 0.1–1.0)
Monocytes Relative: 7 %
NEUTROS ABS: 7.6 10*3/uL (ref 1.7–7.7)
NEUTROS PCT: 72 %
Platelets: 249 10*3/uL (ref 150–400)
RBC: 4.69 MIL/uL (ref 4.22–5.81)
RDW: 15 % (ref 11.5–15.5)
WBC: 10.8 10*3/uL — AB (ref 4.0–10.5)

## 2015-12-02 LAB — GLUCOSE, CAPILLARY
GLUCOSE-CAPILLARY: 117 mg/dL — AB (ref 65–99)
GLUCOSE-CAPILLARY: 129 mg/dL — AB (ref 65–99)
Glucose-Capillary: 112 mg/dL — ABNORMAL HIGH (ref 65–99)
Glucose-Capillary: 149 mg/dL — ABNORMAL HIGH (ref 65–99)
Glucose-Capillary: 153 mg/dL — ABNORMAL HIGH (ref 65–99)
Glucose-Capillary: 119 mg/dL — ABNORMAL HIGH (ref 65–99)

## 2015-12-02 LAB — RENAL FUNCTION PANEL
Albumin: 2.5 g/dL — ABNORMAL LOW (ref 3.5–5.0)
Anion gap: 7 (ref 5–15)
BUN: 44 mg/dL — ABNORMAL HIGH (ref 6–20)
CHLORIDE: 101 mmol/L (ref 101–111)
CO2: 36 mmol/L — AB (ref 22–32)
CREATININE: 1.19 mg/dL (ref 0.61–1.24)
Calcium: 9.2 mg/dL (ref 8.9–10.3)
GFR calc non Af Amer: >60 mL/min (ref >60)
Glucose, Bld: 141 mg/dL — ABNORMAL HIGH (ref 65–99)
Phosphorus: 4.7 mg/dL — ABNORMAL HIGH (ref 2.5–4.6)
Potassium: 3.9 mmol/L (ref 3.5–5.1)
Sodium: 144 mmol/L (ref 135–145)

## 2015-12-02 LAB — CULTURE, RESPIRATORY W GRAM STAIN

## 2015-12-02 LAB — PROCALCITONIN: PROCALCITONIN: 0.31 ng/mL

## 2015-12-02 LAB — TRIGLYCERIDES: Triglycerides: 209 mg/dL — ABNORMAL HIGH (ref <150)

## 2015-12-02 LAB — MAGNESIUM: Magnesium: 2.1 mg/dL (ref 1.7–2.4)

## 2015-12-02 MED ORDER — RANITIDINE HCL 150 MG/10ML PO SYRP
300.0000 mg | ORAL_SOLUTION | Freq: Every day | ORAL | Status: DC
  Administered 2015-12-02 – 2015-12-07 (×6): 300 mg
  Filled 2015-12-02 (×6): qty 20

## 2015-12-02 MED ORDER — TOBRAMYCIN 0.3 % OP OINT
TOPICAL_OINTMENT | Freq: Four times a day (QID) | OPHTHALMIC | Status: DC
  Administered 2015-12-02: 1 via OPHTHALMIC
  Administered 2015-12-02 (×2): via OPHTHALMIC
  Administered 2015-12-03: 1 via OPHTHALMIC
  Administered 2015-12-03 – 2015-12-04 (×4): via OPHTHALMIC
  Administered 2015-12-04: 1 via OPHTHALMIC
  Administered 2015-12-04 (×2): via OPHTHALMIC
  Administered 2015-12-05 (×4): 1 via OPHTHALMIC
  Administered 2015-12-06 – 2015-12-07 (×6): via OPHTHALMIC
  Filled 2015-12-02 (×2): qty 3.5

## 2015-12-02 MED ORDER — PRO-STAT SUGAR FREE PO LIQD
30.0000 mL | Freq: Every day | ORAL | Status: DC
  Administered 2015-12-02 – 2015-12-07 (×6): 30 mL
  Filled 2015-12-02 (×10): qty 30

## 2015-12-02 MED ORDER — FAMOTIDINE 40 MG/5ML PO SUSR
40.0000 mg | Freq: Every day | ORAL | Status: DC
  Filled 2015-12-02: qty 5

## 2015-12-02 MED ORDER — VITAL HIGH PROTEIN PO LIQD
1000.0000 mL | ORAL | Status: DC
  Administered 2015-12-02 – 2015-12-07 (×6): 1000 mL
  Filled 2015-12-02 (×11): qty 1000

## 2015-12-02 MED ORDER — FUROSEMIDE 10 MG/ML IJ SOLN
40.0000 mg | Freq: Two times a day (BID) | INTRAMUSCULAR | Status: AC
Start: 2015-12-02 — End: 2015-12-02
  Administered 2015-12-02 (×2): 40 mg via INTRAVENOUS
  Filled 2015-12-02 (×2): qty 4

## 2015-12-02 NOTE — Progress Notes (Signed)
Nutrition Follow-up  DOCUMENTATION CODES:   Morbid obesity  INTERVENTION:  - Will update TF regimen: Vital High Protein @ 65 mL/hr with 30 mL Prostat once/day which will provide 1660 kcal, 151 grams of protein, and 1304 mL free water.   - Continue PEPUP protocol.  - Free water flush per MD/NP order given goal for diuresis.  - RD will follow-up 8/11.  NUTRITION DIAGNOSIS:   Inadequate oral intake related to inability to eat as evidenced by NPO status. -ongoing  GOAL:   Provide needs based on ASPEN/SCCM guidelines -met with current TF regimen.  MONITOR:   Vent status, TF tolerance, Weight trends, Labs, Skin, I & O's  ASSESSMENT:   This is a 35 year old gentleman with multiple medical issues including diabetes. He states he woke up after midnight tonight and had pain in his left lower extremity. He came to the ER. He denies any recent trauma, stating the scarring on the left lower extremity are old scars.  8/9 Pt with OGT now in place and currently receiving TF at goal rate: Vital High Protein with 60 mL Prostat BID. This regimen is providing 1360 kcal (95% minimum estimated kcal needs), 144 grams of protein, and 803 mL free water. Per chart review, weight down 17.3 kg since 7/31 and kcal needs re-estimated per ASPEN/SCCM guideline with current weight.   Patient is currently intubated on ventilator support MV: 9.2 L/min Temp (24hrs), Avg:101.2 F (38.4 C), Min:98.6 F (37 C), Max:101.8 F (38.8 C) Propofol: off since previous assessment.  Will adjust TF regimen as outlined above and follow-up 8/11. Pt given stool softeners and did have a BM yesterday which was the first since admission.  Medications reviewed; 40 mg IV Lasix BID, sliding scale Novolog, PRN IV Zofran, 5 mL Senokot BID, SMOG enema given 8/8. Labs reviewed; CBGs: 112-149 mg/dL this AM, BUN: 44 mg/dL, Phos: 4.7 mg/dL.  Drips: Fentanyl @ 150 mcg/hr, Versed @ 7.5 mg/hr.     8/7 - No new weight since 11/23/15.   - Pt currently receiving Vital High Protein @ 50 mL/hr with 30 mL Prostat TID.  - This regimen is providing 1500 kcal, 150 grams of protein, and 1003 mL free water.  No BM since admission.  5 mL Senokot BID, 100 mg Colace BID.  Patient is currently intubated on ventilator support MV: 8.7 L/min Temp (24hrs), Avg:101.4 F (38.6 C), Min:100.9 F (38.3 C), Max:101.8 F (38.8 C) Propofol: 26.5 ml/hr (700 kcal)  Drips: Propofol @ 30 mcg/kg/min, Fentanyl @ 225 mcg/hr.    8/4 - Pt currently receiving Vital High Protein @ 60 mL/hr with 30 mL Prostat BID which is providing 1640 kcal, 156 grams of protein, and 1204 mL free water. Will change regimen to Vital High Protein to 50 mL/hr with 30 mL Prostat TID d/t current Propofol rate.   Patient is currently intubated on ventilator support MV: 8.1L/min Temp (24hrs), Avg:100.9 F (38.3 C), Min:100.6 F (38.1 C), Max:101.3 F (38.5 C) Propofol: 20.92m/hr (536 kcal).  Drips:Propofol @ 23 mcg/kg/min, Fentanyl '@200'  mcg/hr (d/t increased agitation overnight and need to increase sedation).    Diet Order:  Diet NPO time specified  Skin:  Reviewed, no issues  Last BM:  8/8  Height:   Ht Readings from Last 1 Encounters:  11/29/15 '5\' 9"'  (1.753 m)    Weight:   Wt Readings from Last 1 Encounters:  12/02/15 286 lb (129.7 kg)    Ideal Body Weight:  72.72 kg  BMI:  Body mass index is  42.23 kg/m.  Estimated Nutritional Needs:   Kcal:  1427-1816 (11-14 kcal acutal body weight)  Protein:  145-160 grams (2-2.2 grams/kg IBW)  Fluid:  >/= 1.6L  EDUCATION NEEDS:   No education needs identified at this time    Jarome Matin, MS, RD, LDN Inpatient Clinical Dietitian Pager # [phone] After hours/weekend pager # [phone]

## 2015-12-02 NOTE — Progress Notes (Signed)
PULMONARY / CRITICAL CARE MEDICINE   Name: Kenneth Mcfarland MRN: [id number] DOB: 1980/06/29    ADMISSION DATE:  11/23/2015 CONSULTATION DATE:  11/24/2015  REFERRING MD:  Dr. Robb Matar, [facility]  CHIEF COMPLAINT:  Short of breath  SUBJECTIVE:  Fever curve a little improved. Still w/ significant scleral edema   REVIEW OF SYSTEMS:  Unable to obtain with intubation & sedation.  VITAL SIGNS: BP (!) 127/52   Pulse (!) 104   Temp 98.6 F (37 C) (Oral)   Resp 16   Ht  (1.753 m)   Wt 286 lb (129.7 kg)   SpO2 93%   BMI 42.23 kg/m   HEMODYNAMICS:    VENTILATOR SETTINGS: Vent Mode: PRVC FiO2 (%):  [40 %] 40 % Set Rate:  [16 bmp] 16 bmp Vt Set:  [570 mL] 570 mL PEEP:  [8 cmH20-10 cmH20] 8 cmH20 Plateau Pressure:  [23 cmH20-25 cmH20] 23 cmH20  INTAKE / OUTPUT:  Intake/Output Summary (Last 24 hours) at 12/02/15 0837 Last data filed at 12/02/15 0700  Gross per 24 hour  Intake          1930.54 ml  Output             2445 ml  Net          -514.46 ml     PHYSICAL EXAMINATION: General: No distress. family at bedside. Morbid obesity. Neuro: Sedated. Nods appropriately. Follows commands HEENT:  Endotracheal tube in place. Ongoing conjunctival chemosis. Continued tongue swelling/glossitis.  Cardiovascular:  Regular rate. No edema. Unable to appreciate JVD. Pulmonary:diffuse rhonchi bilaterally to auscultation. Symmetric chest wall rise on ventilator.  Abdomen:  Soft. Protuberant. Hypoactive bowel sounds. Integument: Warm and dry. No rash on exposed skin. Dressings in place over left lower extremity.  LABS:  BMET  Recent Labs Lab 11/30/15 0332 12/01/15 0300 12/02/15 0334  NA 140 141 144  K 4.2 4.1 3.9  CL 94* 97* 101  CO2 39* 36* 36*  BUN 45* 47* 44*  CREATININE 1.11 1.25* 1.19  GLUCOSE 148* 131* 141*    Electrolytes  Recent Labs Lab 11/29/15 0342 11/30/15 0332 12/01/15 0300 12/02/15 0334  CALCIUM 9.0 9.1 9.3 9.2  MG 2.1 2.3  --  2.1  PHOS 4.9* 6.0*  --  4.7*     CBC  Recent Labs Lab 11/30/15 0332 12/01/15 0300 12/02/15 0334  WBC 10.3 12.0* 10.8*  HGB 13.2 14.3 12.7*  HCT 44.4 46.8 43.6  PLT 265 248 249    Coag's No results for input(s): APTT, INR in the last 168 hours.  Sepsis Markers  Recent Labs Lab 11/29/15 0342 12/01/15 0250 12/02/15 0334  LATICACIDVEN  --  0.9  --   PROCALCITON 0.70 0.51 0.31    ABG  Recent Labs Lab 11/27/15 0428 11/29/15 0445 12/01/15 0402  PHART 7.340* 7.432 7.383  PCO2ART 78.0* 71.8* 65.4*  PO2ART 66.5* 104* 84.3    Liver Enzymes  Recent Labs Lab 11/30/15 0332 12/01/15 0300 12/02/15 0334  AST  --  73*  --   ALT  --  45  --   ALKPHOS  --  81  --   BILITOT  --  0.4  --   ALBUMIN 2.5* 2.6* 2.5*    Cardiac Enzymes No results for input(s): TROPONINI, PROBNP in the last 168 hours.  Glucose  Recent Labs Lab 12/01/15 1143 12/01/15 1454 12/01/15 2019 12/02/15 0036 12/02/15 0350 12/02/15 0745  GLUCAP 153* 107* 139* 112* 149* 117*    Imaging Dg Abd 1  View  Result Date: 12/01/2015 CLINICAL DATA:  Constipation. EXAM: ABDOMEN - 1 VIEW COMPARISON:  Radiograph of November 30, 2015. FINDINGS: The bowel gas pattern is normal. Moderate amount of stool is seen in the right colon. No radio-opaque calculi or other significant radiographic abnormality are seen. Distal tip of nasogastric tube is seen in expected position of duodenum. IMPRESSION: Moderate stool burden is noted. No abnormal bowel dilatation is noted. Electronically Signed   By: Lupita Raider, M.D.   On: 12/01/2015 09:15   Dg Chest Port 1 View  Result Date: 12/01/2015 CLINICAL DATA:  Pulmonary infiltrates. EXAM: PORTABLE CHEST 1 VIEW COMPARISON:  11/27/2015. FINDINGS: Endotracheal tube and NG tube in stable position. Cardiomegaly with improving bilateral pulmonary infiltrates consistent with improving pulmonary edema. Low lung volumes. No pleural effusion or pneumothorax. IMPRESSION: 1. Lines and tubes in stable position. 2.  Cardiomegaly with improving bilateral pulmonary infiltrates consistent with improving pulmonary edema. Electronically Signed   By: Maisie Fus  Register   On: 12/01/2015 07:10   Dg Abd Portable 1v  Result Date: 11/30/2015 CLINICAL DATA:  OG tube placement. EXAM: PORTABLE ABDOMEN - 1 VIEW COMPARISON:  None. FINDINGS: The tip and side port of the enteric tube are below the diaphragm in the stomach. Air-filled colon in the upper abdomen. No free air. IMPRESSION: Tip and side port of the enteric tube below the diaphragm in the stomach. Electronically Signed   By: Rubye Oaks M.D.   On: 11/30/2015 22:46     STUDIES:  PSG 03/03/15: AHI 108, SpO2 low 60%.  CPAP 16 >> AHI 0 Doppler Lt leg 11/23/15: negative for DVT, limited study TTE 11/24/15: mod LVH, EF 65 to 70%, mod RV dilation, mod TR, PAS 85 mmHg Port CXR 8/4:  ETT & Enteric tubes in good position. Silhouetting left hemidiaphragm with some air bronchograms suggestive of consolidation. Blunting of right costophrenic angle along with fluid within right fissure suggestive of pleural effusion. Port CXR 8/8:  ETT in good position. Still with silhouetting of hemidiaphragms but some improvement in interstitial markings. Enteric feeding tube coursing below diaphragm.  MICROBIOLOGY: Urine Ctx 7/30:  Multiple Species Blood Ctx x2 7/30:  Negative  MRSA PCR 7/30:  Negative  Tracheal Asp Ctx 8/4: Few C albicans  Blood Ctx x2 8/4 >> Tracheal Asp Ctx 8/5 >> few yeast Blood Ctx x2 8/7 >> Tracheal Asp Ctx 8/7 >>  ANTIBIOTICS: Aztreonam 7/29 - 7/30 Flagyl 7/29 - 7/30 Rocephin 7/32 - 8/1 Vancomycin 7/29 - 8/7 Cefepime 8/1 - 8/7  SIGNIFICANT EVENTS: 7/29 Admit 8/01 VDRF  LINES/TUBES: OETT 8/1 - 8/2 (self extubation); 8/2 (reintubated by anesthesia w/ difficult airway) >> NGT 8/1 >> FOLEY >> PIV x2  ASSESSMENT / PLAN:  PULMONARY A: Acute on Chronic Hypoxic Respiratory Failure - Presumed diastolic CHF. Acute on Chronic Hypercarbic Respiratory  Failure HCAP OSA/OHS Narcolepsy EXTREMELY DIFFICULTY AIRWAY Possible COPD/Asthma Tobacco Use Disorder ->slowly weaning O2 and peep  ->very likely he will need trach P: Full Vent Support Weaning FiO2 for Sat 88-92% Decreased PEEP to 8 (hold here for now) Continuing Duoneb q6hr Nicotine 7mg  Patch Resume diuresis   CARDIAC A: Acute Cor Pulmonale/RV Failure Acute on Chronic Diastolic CHF Hypotension - Resolved. Secondary to sedation. H/O Hypertension  P: Monitoring on Telemetry Vitals per unit protocol Resume diuresis and reassess daily Goal MAP > 65 Holding outpatient Lisinopril  RENAL A: Hyperphosphatemia - Mild & improved Uremia - Worsening w/ diuresis->improved 8/9 Acute Renal Failure - Resolved. Metabolic Alkalosis - Likely contraction. Compensated.  P: Trending UOP with Foley Monitoring electrolytes & renal funciton daily Replacing electrolytes as indicated Reassess diuretics daily   GASTROENTEROLOGY A: Constipation - No response with Fleet enema 8/7->resolved w/ SMOG enema   P: Continuing Tube Feedings Change to H2 blocker  Senna & Colace VT bid Abdominal X-ray now  HEMATOLOGY A: Leukocytosis - Mild. Possibly reactive.  P: Trending cell counts daily w/ CBC Heparin [place] q8hr SCDs  INFECTION A: Sepsis Left Leg Cellulitis w/ 2 Blisters - Mild. LLL HCAP  FUO - Possible drug reaction. S/P Course of Vancomycin & Cefepime stopped 8/7  P: Trending Procalcitonin per algorithm Repeat Ctx from 8/7 pending   ENDOCRINE A: H/O DM Type 2 - Glucose controlled.  P: Accu-Checks q4hr SSI per Resistant Algorithm Holding outpatient glucotrol & glucophage  NEUROLOGY A: Acute Encephalopathy - Multifactorial w/ metabolic, hypercarbic, & hypoxic. Sedation on Ventilator Chronic Outpatient Oxycodone & Xanax H/O Diabetic Neuropathy H/O Bipolar Disorder H/O PTSD  P: RASS goal: -1 to -2 Fentanyl gtt & IV prn Pain  Versed gtt for sedation &  continuing IV prn Weaning Propofol gtt to off Hold outpt xanax & oxycodone Neurontin 300mg  VT bid  HEENT: A: Sclera Edema/conjunctival chemosis  Glossitis  P: Continue Benadryl IV q6hr for nother 24 hours Patanol drops bilaterally tobra eye gtts started 8/9 Change to H2 blockade  Keep HOB elevated  FAMILY UPDATE:  Mother updated by Dr. Jamison NeighborNestor via phone 8/5. Girlfriend updated at bedside 8/7.   TODAY'S SUMMARY:   35 y/o male admitted with sepsis from Lt leg cellulitis.  Has hx of tobacco abuse and severe obstructive sleep apnea.  He has been non compliant with CPAP as outpt. Patient's respiratory status steadily improving. Origin of conjunctival chemosis is unclear and possibly secondary to allergy Lacri-Lube; also consider local infection. Continuing to monitor cultures in the setting of fever of unknown origin. Procalcitonin only mildly elevated but all cultures negative. Had BM yesterday. For today the goal will be to 1) resume diuresis 2) start tobra eye gtts in case we are missing local scleral infection 3) change ppi to H2 blocker 4) cont supportive care. Still think he will need trach.   Simonne MartinetPeter E Babcock ACNP-BC [facility] Pulmonary/Critical Care Pager # [phone] OR # [phone] if no answer  PCCM Attending Note: Patient continues to have significant scleral edema. Patient seen and examined with nurse practitioner. Please refer to his progress note which I have reviewed in detail. Initiating antibiotic eyedrops in the event that this is infectious in etiology. Continuing to hold Lacri-Lube and further antibiotic therapy is pending new culture data so suggest infection. Reinitiate diuresis gently today. Continuing slow wean of ventilator. Significant other updated at bedside 8/9 by Dr. Jamison NeighborNestor.  I have spent a total of 36 minutes of critical care time today caring for the patient, reviewing plan of care with significant other bedside, and reviewing the patient's electronic medical  record.  Donna ChristenJennings E. Jamison NeighborNestor, M.D. [facility] Pulmonary & Critical Care Pager:  [phone] After 3pm or if no response, call [phone] 2:44 PM 12/02/15

## 2015-12-02 NOTE — Progress Notes (Signed)
ETT holder changed, with assistance of another RT, without complications. 

## 2015-12-03 ENCOUNTER — Inpatient Hospital Stay (HOSPITAL_COMMUNITY): Payer: Medicaid Other

## 2015-12-03 LAB — CBC WITH DIFFERENTIAL/PLATELET
Basophils Absolute: 0 10*3/uL (ref 0.0–0.1)
Basophils Relative: 0 %
EOS ABS: 0.3 10*3/uL (ref 0.0–0.7)
Eosinophils Relative: 3 %
HEMATOCRIT: 43.3 % (ref 39.0–52.0)
HEMOGLOBIN: 13 g/dL (ref 13.0–17.0)
LYMPHS ABS: 2.6 10*3/uL (ref 0.7–4.0)
Lymphocytes Relative: 24 %
MCH: 28.1 pg (ref 26.0–34.0)
MCHC: 30 g/dL (ref 30.0–36.0)
MCV: 93.5 fL (ref 78.0–100.0)
MONO ABS: 1 10*3/uL (ref 0.1–1.0)
MONOS PCT: 10 %
NEUTROS PCT: 63 %
Neutro Abs: 6.7 10*3/uL (ref 1.7–7.7)
Platelets: 239 10*3/uL (ref 150–400)
RBC: 4.63 MIL/uL (ref 4.22–5.81)
RDW: 14.9 % (ref 11.5–15.5)
WBC: 10.7 10*3/uL — ABNORMAL HIGH (ref 4.0–10.5)

## 2015-12-03 LAB — GLUCOSE, CAPILLARY
GLUCOSE-CAPILLARY: 110 mg/dL — AB (ref 65–99)
GLUCOSE-CAPILLARY: 133 mg/dL — AB (ref 65–99)
GLUCOSE-CAPILLARY: 152 mg/dL — AB (ref 65–99)
Glucose-Capillary: 136 mg/dL — ABNORMAL HIGH (ref 65–99)
Glucose-Capillary: 141 mg/dL — ABNORMAL HIGH (ref 65–99)
Glucose-Capillary: 131 mg/dL — ABNORMAL HIGH (ref 65–99)
Glucose-Capillary: 140 mg/dL — ABNORMAL HIGH (ref 65–99)

## 2015-12-03 LAB — RENAL FUNCTION PANEL
ALBUMIN: 2.5 g/dL — AB (ref 3.5–5.0)
ANION GAP: 6 (ref 5–15)
BUN: 47 mg/dL — AB (ref 6–20)
CALCIUM: 9.2 mg/dL (ref 8.9–10.3)
CO2: 36 mmol/L — AB (ref 22–32)
Chloride: 104 mmol/L (ref 101–111)
Creatinine, Ser: 1.14 mg/dL (ref 0.61–1.24)
GFR calc Af Amer: >60 mL/min (ref >60)
GFR calc non Af Amer: >60 mL/min (ref >60)
GLUCOSE: 144 mg/dL — AB (ref 65–99)
PHOSPHORUS: 4.8 mg/dL — AB (ref 2.5–4.6)
Potassium: 3.9 mmol/L (ref 3.5–5.1)
SODIUM: 146 mmol/L — AB (ref 135–145)

## 2015-12-03 LAB — URINALYSIS W MICROSCOPIC (NOT AT ARMC)
Bilirubin Urine: NEGATIVE
Glucose, UA: NEGATIVE mg/dL
KETONES UR: NEGATIVE mg/dL
Leukocytes, UA: NEGATIVE
Nitrite: NEGATIVE
PH: 5.5 (ref 5.0–8.0)
Protein, ur: 30 mg/dL — AB
SPECIFIC GRAVITY, URINE: 1.029 (ref 1.005–1.030)

## 2015-12-03 LAB — PROCALCITONIN: PROCALCITONIN: 0.21 ng/mL

## 2015-12-03 LAB — MAGNESIUM: Magnesium: 2 mg/dL (ref 1.7–2.4)

## 2015-12-03 MED ORDER — FENTANYL BOLUS VIA INFUSION
25.0000 ug | INTRAVENOUS | Status: DC | PRN
  Administered 2015-12-03 – 2015-12-04 (×2): 50 ug via INTRAVENOUS
  Administered 2015-12-04: 25 ug via INTRAVENOUS
  Administered 2015-12-04 – 2015-12-06 (×8): 50 ug via INTRAVENOUS
  Filled 2015-12-03: qty 100

## 2015-12-03 MED ORDER — MIDAZOLAM BOLUS VIA INFUSION
1.0000 mg | INTRAVENOUS | Status: DC | PRN
  Administered 2015-12-03: 4 mg via INTRAVENOUS
  Administered 2015-12-03: 1 mg via INTRAVENOUS
  Administered 2015-12-04: 3 mg via INTRAVENOUS
  Administered 2015-12-04: 1 mg via INTRAVENOUS
  Administered 2015-12-04: 2 mg via INTRAVENOUS
  Administered 2015-12-04 (×2): 1 mg via INTRAVENOUS
  Administered 2015-12-04: 2 mg via INTRAVENOUS
  Administered 2015-12-04 (×2): 1 mg via INTRAVENOUS
  Administered 2015-12-05: 2 mg via INTRAVENOUS
  Administered 2015-12-05: 3 mg via INTRAVENOUS
  Administered 2015-12-05 (×2): 1 mg via INTRAVENOUS
  Administered 2015-12-05 – 2015-12-06 (×3): 2 mg via INTRAVENOUS
  Filled 2015-12-03: qty 4

## 2015-12-03 MED ORDER — FREE WATER
200.0000 mL | Freq: Four times a day (QID) | Status: DC
  Administered 2015-12-03 – 2015-12-07 (×17): 200 mL

## 2015-12-03 MED ORDER — DEXMEDETOMIDINE HCL IN NACL 200 MCG/50ML IV SOLN
0.0000 ug/kg/h | INTRAVENOUS | Status: DC
  Administered 2015-12-03 (×2): 1.2 ug/kg/h via INTRAVENOUS
  Administered 2015-12-03: 0.4 ug/kg/h via INTRAVENOUS
  Filled 2015-12-03 (×3): qty 50

## 2015-12-03 MED ORDER — FENTANYL CITRATE (PF) 2500 MCG/50ML IJ SOLN
25.0000 ug/h | INTRAMUSCULAR | Status: DC
  Administered 2015-12-03 – 2015-12-04 (×2): 250 ug/h via INTRAVENOUS
  Administered 2015-12-04: 175 ug/h via INTRAVENOUS
  Administered 2015-12-04: 200 ug/h via INTRAVENOUS
  Administered 2015-12-05: 250 ug/h via INTRAVENOUS
  Administered 2015-12-06 – 2015-12-07 (×3): 200 ug/h via INTRAVENOUS
  Administered 2015-12-07: 10 ug/h via INTRAVENOUS
  Filled 2015-12-03 (×8): qty 50

## 2015-12-03 MED ORDER — FENTANYL CITRATE (PF) 100 MCG/2ML IJ SOLN: 100.0000 ug | INTRAMUSCULAR | Status: DC | PRN

## 2015-12-03 MED ORDER — IBUPROFEN 200 MG PO TABS
600.0000 mg | ORAL_TABLET | Freq: Once | ORAL | Status: AC
  Administered 2015-12-03: 600 mg via ORAL
  Filled 2015-12-03: qty 3

## 2015-12-03 MED ORDER — FUROSEMIDE 10 MG/ML IJ SOLN
40.0000 mg | Freq: Two times a day (BID) | INTRAMUSCULAR | Status: AC
  Administered 2015-12-03 (×2): 40 mg via INTRAVENOUS
  Filled 2015-12-03 (×2): qty 4

## 2015-12-03 MED ORDER — SODIUM CHLORIDE 0.9 % IV SOLN
1.0000 mg/h | INTRAVENOUS | Status: DC
  Administered 2015-12-03 (×2): 5 mg/h via INTRAVENOUS
  Administered 2015-12-04: 6 mg/h via INTRAVENOUS
  Administered 2015-12-04: 3 mg/h via INTRAVENOUS
  Administered 2015-12-05: 4 mg/h via INTRAVENOUS
  Administered 2015-12-05: 5 mg/h via INTRAVENOUS
  Administered 2015-12-05: 4 mg/h via INTRAVENOUS
  Administered 2015-12-06 (×2): 6 mg/h via INTRAVENOUS
  Administered 2015-12-07: 2 mg/h via INTRAVENOUS
  Administered 2015-12-07 (×2): 6 mg/h via INTRAVENOUS
  Filled 2015-12-03 (×11): qty 10

## 2015-12-03 NOTE — Progress Notes (Signed)
Pt core temperature trending 101.413F to 101.13F following interventions of PRN Tylenol and ice packs applied to groin and axillary. E-Link team made aware. Will continue to monitor.

## 2015-12-03 NOTE — Progress Notes (Signed)
PULMONARY / CRITICAL CARE MEDICINE   Name: Kenneth Mcfarland MRN: [id number] DOB: Mar 20, 1981    ADMISSION DATE:  11/21/2015 CONSULTATION DATE:  11/24/2015  REFERRING MD:  Dr. Robb Matar, [facility]  CHIEF COMPLAINT:  Short of breath  SUBJECTIVE:  Spiked fever last night.  Opens eyes (which actually look better) and follows commands. Tried him on PSV of 12-->Vts were low and he set off apnea alarm initially.   REVIEW OF SYSTEMS:  Unable to obtain with intubation & sedation.  VITAL SIGNS: BP (!) 114/51   Pulse 91   Temp (!) 100.8 F (38.2 C)   Resp 16   Ht  (1.753 m)   Wt 288 lb (130.6 kg)   SpO2 93%   BMI 42.53 kg/m   HEMODYNAMICS:    VENTILATOR SETTINGS: Vent Mode: PRVC FiO2 (%):  [40 %] 40 % Set Rate:  [16 bmp] 16 bmp Vt Set:  [570 mL] 570 mL PEEP:  [5 cmH20-8 cmH20] 5 cmH20 Plateau Pressure:  [20 cmH20-24 cmH20] 24 cmH20  INTAKE / OUTPUT:  Intake/Output Summary (Last 24 hours) at 12/03/15 0828 Last data filed at 12/03/15 0600  Gross per 24 hour  Intake          1740.11 ml  Output             2925 ml  Net         -1184.89 ml     PHYSICAL EXAMINATION: General: No distress. family at bedside. Morbid obesity. Cooperative-->restless a little w/ wake up exam Neuro: Sedated. Nods appropriately. Follows commands HEENT:  Endotracheal tube in place. Ongoing conjunctival chemosis-->this has improved some over the last 24hrs. Continued tongue swelling/glossitis-->this has also improved some  Cardiovascular:  Regular rate. No edema. Unable to appreciate JVD. Pulmonary:diffuse rhonchi bilaterally to auscultation. Symmetric chest wall rise on ventilator. Vts in 200s on PSV attempt Abdomen:  Soft. Protuberant. Hypoactive bowel sounds. Integument: Warm and dry. No rash on exposed skin. Dressings in place over left lower extremity.  LABS:  BMET  Recent Labs Lab 12/01/15 0300 12/02/15 0334 12/03/15 0323  NA 141 144 146*  K 4.1 3.9 3.9  CL 97* 101 104  CO2 36* 36* 36*   BUN 47* 44* 47*  CREATININE 1.25* 1.19 1.14  GLUCOSE 131* 141* 144*    Electrolytes  Recent Labs Lab 11/30/15 0332 12/01/15 0300 12/02/15 0334 12/03/15 0323  CALCIUM 9.1 9.3 9.2 9.2  MG 2.3  --  2.1 2.0  PHOS 6.0*  --  4.7* 4.8*    CBC  Recent Labs Lab 12/01/15 0300 12/02/15 0334 12/03/15 0323  WBC 12.0* 10.8* 10.7*  HGB 14.3 12.7* 13.0  HCT 46.8 43.6 43.3  PLT 248 249 239    Coag's No results for input(s): APTT, INR in the last 168 hours.  Sepsis Markers  Recent Labs Lab 12/01/15 0250 12/02/15 0334 12/03/15 0323  LATICACIDVEN 0.9  --   --   PROCALCITON 0.51 0.31 0.21    ABG  Recent Labs Lab 11/27/15 0428 11/29/15 0445 12/01/15 0402  PHART 7.340* 7.432 7.383  PCO2ART 78.0* 71.8* 65.4*  PO2ART 66.5* 104* 84.3    Liver Enzymes  Recent Labs Lab 12/01/15 0300 12/02/15 0334 12/03/15 0323  AST 73*  --   --   ALT 45  --   --   ALKPHOS 81  --   --   BILITOT 0.4  --   --   ALBUMIN 2.6* 2.5* 2.5*    Cardiac Enzymes No results for input(s):  TROPONINI, PROBNP in the last 168 hours.  Glucose  Recent Labs Lab 12/02/15 1144 12/02/15 1615 12/02/15 1944 12/03/15 0026 12/03/15 0353 12/03/15 0733  GLUCAP 153* 129* 119* 141* 136* 131*    Imaging Dg Abd 1 View  Result Date: 12/01/2015 CLINICAL DATA:  Constipation. EXAM: ABDOMEN - 1 VIEW COMPARISON:  Radiograph of November 30, 2015. FINDINGS: The bowel gas pattern is normal. Moderate amount of stool is seen in the right colon. No radio-opaque calculi or other significant radiographic abnormality are seen. Distal tip of nasogastric tube is seen in expected position of duodenum. IMPRESSION: Moderate stool burden is noted. No abnormal bowel dilatation is noted. Electronically Signed   By: Lupita Raider, M.D.   On: 12/01/2015 09:15   Dg Chest Port 1 View  Result Date: 12/03/2015 CLINICAL DATA:  Respiratory failure. EXAM: PORTABLE CHEST 1 VIEW COMPARISON:  12/01/2015. FINDINGS: Endotracheal tube, NG  tube in stable position. Cardiomegaly with progressive bilateral pulmonary infiltrates most consistent pulmonary edema. Bilateral pleural effusions. Findings consistent congestive heart failure. No pneumothorax . IMPRESSION: 1.  Lines and tubes in stable position. 2. Cardiomegaly with diffuse bilateral pulmonary infiltrates and bilateral effusions consistent with worsening congestive heart failure. Electronically Signed   By: Maisie Fus  Register   On: 12/03/2015 07:16     STUDIES:  PSG 03/03/15: AHI 108, SpO2 low 60%.  CPAP 16 >> AHI 0 Doppler Lt leg 11/23/15: negative for DVT, limited study TTE 11/24/15: mod LVH, EF 65 to 70%, mod RV dilation, mod TR, PAS 85 mmHg Port CXR 8/4:  ETT & Enteric tubes in good position. Silhouetting left hemidiaphragm with some air bronchograms suggestive of consolidation. Blunting of right costophrenic angle along with fluid within right fissure suggestive of pleural effusion. Port CXR 8/8:  ETT in good position. Still with silhouetting of hemidiaphragms but some improvement in interstitial markings. Enteric feeding tube coursing below diaphragm. Port CXR 8/10: no sig change. Interstitial markings unchanged. May be element of effusions.   MICROBIOLOGY: Urine Ctx 7/30:  Multiple Species Blood Ctx x2 7/30:  Negative  MRSA PCR 7/30:  Negative  Tracheal Asp Ctx 8/4: Few C albicans  Blood Ctx x2 8/4 >> Tracheal Asp Ctx 8/5 >> few yeast Blood Ctx x2 8/7 >> Tracheal Asp Ctx 8/7 >>normal flora  ANTIBIOTICS: Aztreonam 7/29 - 7/30 Flagyl 7/29 - 7/30 Rocephin 7/32 - 8/1 Vancomycin 7/29 - 8/7 Cefepime 8/1 - 8/7  SIGNIFICANT EVENTS: 7/29 Admit 8/01 VDRF  LINES/TUBES: OETT 8/1 - 8/2 (self extubation); 8/2 (reintubated by anesthesia w/ difficult airway) >> NGT 8/1 >> FOLEY >> PIV x2  ASSESSMENT / PLAN:  PULMONARY A: Acute on Chronic Hypoxic Respiratory Failure - Presumed diastolic CHF. Acute on Chronic Hypercarbic Respiratory  Failure HCAP OSA/OHS Narcolepsy EXTREMELY DIFFICULTY AIRWAY Possible COPD/Asthma Tobacco Use Disorder ->slowly weaning O2 and peep  ->no sig change in CXR.  ->very likely he will need trach P: Full Vent Support-->will transition to Precedex gtt and see if he can begin some PSV trials Weaning FiO2 for Sat 88-92% Decreased PEEP to 8 (hold here for now) Continuing Duoneb q6hr Nicotine 7mg  Patch Resumed diuresis -->will repeat again today (aim for negative 1L/day)  CARDIAC A: Acute Cor Pulmonale/RV Failure Acute on Chronic Diastolic CHF Hypotension - Resolved. Secondary to sedation. H/O Hypertension  P: Monitoring on Telemetry Vitals per unit protocol Continue diuresis and reassess daily Goal MAP > 65 Holding outpatient Lisinopril  RENAL A: Hyperphosphatemia - Mild & improved Uremia - Worsening w/ diuresis->improved 8/9 Acute Renal Failure -  Resolved. Metabolic Alkalosis - Likely contraction. Compensated. Hypernatremia  P: Trending UOP with Foley Add free water via tube Monitoring electrolytes & renal funciton daily Replacing electrolytes as indicated Reassess diuretics daily   GASTROENTEROLOGY A: Constipation - No response with [place] enema 8/7->resolved w/ SMOG enema  Morbid obesity  P: Continuing Tube Feedings H2 blocker  Senna & Colace VT bid  HEMATOLOGY A: Leukocytosis - Mild. Possibly reactive.  P: Trending cell counts daily w/ CBC Heparin [place] q8hr SCDs  INFECTION A: Sepsis Left Leg Cellulitis w/ 2 Blisters - Mild. LLL HCAP  FUO - Possible drug reaction. S/P Course of Vancomycin & Cefepime stopped 8/7  P: Trending Procalcitonin per algorithm Repeat Ctx from 8/7 negative to date    ENDOCRINE A: H/O DM Type 2 - Glucose controlled.  P: Accu-Checks q4hr SSI per Resistant Algorithm Holding outpatient glucotrol & glucophage  NEUROLOGY A: Acute Encephalopathy - Multifactorial w/ metabolic, hypercarbic, & hypoxic. Sedation on  Ventilator Chronic Outpatient Oxycodone & Xanax H/O Diabetic Neuropathy H/O Bipolar Disorder H/O PTSD  P: RASS goal: -1 to -2 Fentanyl gtt & IV prn Pain Weaning Propofol gtt to off Hold outpt xanax & oxycodone Neurontin 300mg  VT bid  HEENT: A: Sclera Edema/conjunctival chemosis - Improving. Glossitis - Improving.  P: Continue Benadryl IV q6hr for nother 24 hours Patanol drops bilaterally tobra eye gtts started 8/9 Change to H2 blockade  Keep HOB elevated  FAMILY UPDATE:  Mother updated by Dr. Jamison NeighborNestor via phone 8/5. Girlfriend updated at bedside 8/7.   TODAY'S SUMMARY:   35 y/o male admitted with sepsis from Lt leg cellulitis.  Has hx of tobacco abuse and severe obstructive sleep apnea.  He has been non compliant with CPAP as outpt. Patient's respiratory status steadily improving. Origin of conjunctival chemosis is unclear and possibly secondary to allergy Lacri-Lube; also consider local infection-->this is finally improving. Continuing to monitor cultures in the setting of fever of unknown origin. His CXR is unchanged. There may be an element of effusion; but his body habitus would make him fairly high risk for thora. For today the goal will be to 1) cont diuresis 2) try precedex to see if he can start PSV trial & continue supportive care. I think he will need trach but will see how he tolerates PSV trials.    Simonne MartinetPeter E Babcock ACNP-BC [facility] Pulmonary/Critical Care Pager # [phone] OR # [phone] if no answer  PCCM Attending Note: Patient seen and examined with nurse practitioner. Please refer to his progress note which I reviewed in detail.  Attempting to minimize sedation with transition to Precedex infusion off of fentanyl infusion.  Scleral edema/erythema has significantly improved. Patient currently off systemic antibiotics with downtrending fever curve. Continuing to wean sedation in the hopes that patient will be able to tolerate pressure support trials. May ultimately  require tracheostomy placement.  I have spent a total of 31 minutes of critical care time today caring for the patient and reviewing the patient's electronic medical record.  Donna ChristenJennings E. Jamison NeighborNestor, M.D. [facility] Pager:  [phone] After 3pm or if no response, call [phone] 2:58 PM 12/03/15

## 2015-12-03 NOTE — Progress Notes (Signed)
eLink Physician-Brief Progress Note Patient Name: Kenneth DonningJamaine Mcfarland DOB: December 02, 1980 MRN: [id number]   Date of Service  12/03/2015  HPI/Events of Note  Fever 101.8, in the setting of tylenol, ice paks  eICU Interventions  Improving renal Fcn, ibuprofen 600mg  x 1 per tube     Intervention Category Major Interventions: Other:  Desirai Traxler 12/03/2015, 3:08 AM

## 2015-12-04 DIAGNOSIS — T8351XA Infection and inflammatory reaction due to indwelling urinary catheter, initial encounter: Secondary | ICD-10-CM

## 2015-12-04 DIAGNOSIS — J329 Chronic sinusitis, unspecified: Secondary | ICD-10-CM

## 2015-12-04 DIAGNOSIS — N390 Urinary tract infection, site not specified: Secondary | ICD-10-CM

## 2015-12-04 LAB — GLUCOSE, CAPILLARY
GLUCOSE-CAPILLARY: 153 mg/dL — AB (ref 65–99)
Glucose-Capillary: 126 mg/dL — ABNORMAL HIGH (ref 65–99)
Glucose-Capillary: 149 mg/dL — ABNORMAL HIGH (ref 65–99)
Glucose-Capillary: 109 mg/dL — ABNORMAL HIGH (ref 65–99)

## 2015-12-04 LAB — CBC WITH DIFFERENTIAL/PLATELET
BASOS PCT: 0 %
Basophils Absolute: 0 10*3/uL (ref 0.0–0.1)
EOS PCT: 3 %
Eosinophils Absolute: 0.4 10*3/uL (ref 0.0–0.7)
HCT: 43.1 % (ref 39.0–52.0)
HEMOGLOBIN: 12.7 g/dL — AB (ref 13.0–17.0)
Lymphocytes Relative: 18 %
Lymphs Abs: 2.2 10*3/uL (ref 0.7–4.0)
MCH: 27.5 pg (ref 26.0–34.0)
MCHC: 29.5 g/dL — ABNORMAL LOW (ref 30.0–36.0)
MCV: 93.5 fL (ref 78.0–100.0)
MONO ABS: 1.4 10*3/uL — AB (ref 0.1–1.0)
Monocytes Relative: 11 %
NEUTROS ABS: 8.3 10*3/uL — AB (ref 1.7–7.7)
NEUTROS PCT: 68 %
PLATELETS: 226 10*3/uL (ref 150–400)
RBC: 4.61 MIL/uL (ref 4.22–5.81)
RDW: 15.2 % (ref 11.5–15.5)
WBC: 12.3 10*3/uL — ABNORMAL HIGH (ref 4.0–10.5)

## 2015-12-04 LAB — RENAL FUNCTION PANEL
Albumin: 2.3 g/dL — ABNORMAL LOW (ref 3.5–5.0)
Anion gap: 7 (ref 5–15)
BUN: 47 mg/dL — AB (ref 6–20)
CALCIUM: 9.3 mg/dL (ref 8.9–10.3)
CO2: 35 mmol/L — AB (ref 22–32)
Chloride: 107 mmol/L (ref 101–111)
Creatinine, Ser: 1.22 mg/dL (ref 0.61–1.24)
GFR calc Af Amer: >60 mL/min (ref >60)
GFR calc non Af Amer: >60 mL/min (ref >60)
GLUCOSE: 125 mg/dL — AB (ref 65–99)
Phosphorus: 4.6 mg/dL (ref 2.5–4.6)
Potassium: 4 mmol/L (ref 3.5–5.1)
Sodium: 149 mmol/L — ABNORMAL HIGH (ref 135–145)

## 2015-12-04 LAB — MAGNESIUM: Magnesium: 1.9 mg/dL (ref 1.7–2.4)

## 2015-12-04 MED ORDER — DEXTROSE 5 % IV SOLN
2.0000 g | Freq: Three times a day (TID) | INTRAVENOUS | Status: DC
  Administered 2015-12-04 – 2015-12-07 (×10): 2 g via INTRAVENOUS
  Filled 2015-12-04 (×11): qty 2

## 2015-12-04 NOTE — Progress Notes (Signed)
PULMONARY / CRITICAL CARE MEDICINE   Name: Kenneth Mcfarland MRN: [id number] DOB: [birth year]    ADMISSION DATE:  11/10/2015 CONSULTATION DATE:  11/24/2015  REFERRING MD:  Dr. Robb Matar, [facility]  CHIEF COMPLAINT:  Short of breath  SUBJECTIVE:  Remained febrile overnight. UA suspicious for infection given many bacteria. No acute events overnight. Patient with more foul drainage coming from his nares.  REVIEW OF SYSTEMS:  Unable to obtain with intubation & sedation.  VITAL SIGNS: BP (!) 106/48   Pulse 96   Temp (!) 101.7 F (38.7 C) (Core (Comment))   Resp 16   Ht  (1.753 m)   Wt 278 lb (126.1 kg)   SpO2 100%   BMI 41.05 kg/m   HEMODYNAMICS:    VENTILATOR SETTINGS: Vent Mode: PRVC FiO2 (%):  [40 %] 40 % Set Rate:  [16 bmp] 16 bmp Vt Set:  [570 mL] 570 mL PEEP:  [5 cmH20-8 cmH20] 5 cmH20 Plateau Pressure:  [23 cmH20-28 cmH20] 23 cmH20  INTAKE / OUTPUT:  Intake/Output Summary (Last 24 hours) at 12/04/15 0845 Last data filed at 12/04/15 0600  Gross per 24 hour  Intake          2839.86 ml  Output             2300 ml  Net           539.86 ml     PHYSICAL EXAMINATION: General: No distress. No family at bedside. No acute distress. Neuro: Currently on sedation. Spontaneously moving all 4 extremities. Nodding appropriately to questions. HEENT:  Endotracheal tube in place. Conjunctival swelling/erythema persists. Increased drainage from nares.  Cardiovascular:  Regular rate. No edema. Unable to appreciate JVD given body habitus. Pulmonary: Distant breath sounds bilaterally and slightly decreased in the bases. Symmetric chest wall rise on ventilator. Normal work of breathing on pressure support 5/5. Abdomen:  Soft. Protuberant. Hypoactive bowel sounds that are distant. Integument: Warm and dry. No rash on exposed skin. Dressings in place over left lower extremity.  LABS:  BMET  Recent Labs Lab 12/02/15 0334 12/03/15 0323 12/04/15 0320  NA 144 146* 149*  K 3.9 3.9 4.0   CL 101 104 107  CO2 36* 36* 35*  BUN 44* 47* 47*  CREATININE 1.19 1.14 1.22  GLUCOSE 141* 144* 125*    Electrolytes  Recent Labs Lab 12/02/15 0334 12/03/15 0323 12/04/15 0320  CALCIUM 9.2 9.2 9.3  MG 2.1 2.0 1.9  PHOS 4.7* 4.8* 4.6    CBC  Recent Labs Lab 12/02/15 0334 12/03/15 0323 12/04/15 0320  WBC 10.8* 10.7* 12.3*  HGB 12.7* 13.0 12.7*  HCT 43.6 43.3 43.1  PLT 249 239 226    Coag's No results for input(s): APTT, INR in the last 168 hours.  Sepsis Markers  Recent Labs Lab 12/01/15 0250 12/02/15 0334 12/03/15 0323  LATICACIDVEN 0.9  --   --   PROCALCITON 0.51 0.31 0.21    ABG  Recent Labs Lab 11/29/15 0445 12/01/15 0402  PHART 7.432 7.383  PCO2ART 71.8* 65.4*  PO2ART 104* 84.3    Liver Enzymes  Recent Labs Lab 12/01/15 0300 12/02/15 0334 12/03/15 0323 12/04/15 0320  AST 73*  --   --   --   ALT 45  --   --   --   ALKPHOS 81  --   --   --   BILITOT 0.4  --   --   --   ALBUMIN 2.6* 2.5* 2.5* 2.3*    Cardiac Enzymes  No results for input(s): TROPONINI, PROBNP in the last 168 hours.  Glucose  Recent Labs Lab 12/03/15 1125 12/03/15 1551 12/03/15 1945 12/03/15 2353 12/04/15 0346 12/04/15 0823  GLUCAP 152* 140* 110* 133* 126* 149*    Imaging Dg Chest Port 1 View  Result Date: 12/03/2015 CLINICAL DATA:  Respiratory failure. EXAM: PORTABLE CHEST 1 VIEW COMPARISON:  12/01/2015. FINDINGS: Endotracheal tube, NG tube in stable position. Cardiomegaly with progressive bilateral pulmonary infiltrates most consistent pulmonary edema. Bilateral pleural effusions. Findings consistent congestive heart failure. No pneumothorax . IMPRESSION: 1.  Lines and tubes in stable position. 2. Cardiomegaly with diffuse bilateral pulmonary infiltrates and bilateral effusions consistent with worsening congestive heart failure. Electronically Signed   By: Maisie Fus  Register   On: 12/03/2015 07:16     STUDIES:  PSG 03/03/15: AHI 108, SpO2 low 60%.  CPAP 16  >> AHI 0 Doppler Lt leg 11/23/15: negative for DVT, limited study TTE 11/24/15: mod LVH, EF 65 to 70%, mod RV dilation, mod TR, PAS 85 mmHg Port CXR 8/4:  ETT & Enteric tubes in good position. Silhouetting left hemidiaphragm with some air bronchograms suggestive of consolidation. Blunting of right costophrenic angle along with fluid within right fissure suggestive of pleural effusion. Port CXR 8/8:  ETT in good position. Still with silhouetting of hemidiaphragms but some improvement in interstitial markings. Enteric feeding tube coursing below diaphragm. Port CXR 8/10: no sig change. Interstitial markings unchanged. May be element of effusions.   MICROBIOLOGY: Urine Ctx 7/30:  Multiple Species Blood Ctx x2 7/30:  Negative  MRSA PCR 7/30:  Negative  Tracheal Asp Ctx 8/4: Few C albicans  Blood Ctx x2 8/4:  Negative  Tracheal Asp Ctx 8/5: Few C albicans Blood Ctx x2 8/7 >> Tracheal Asp Ctx 8/7:  Normal flora Blood Ctx 8/10 >> Tracheal Asp Ctx 8/10 >> Urine Ctx 8/11 >>  ANTIBIOTICS: Aztreonam 7/29 - 7/30 Flagyl 7/29 - 7/30 Rocephin 7/32 - 8/1 Vancomycin 7/29 - 8/7 Cefepime 8/1 - 8/7 Aztreonam 8/11 >>  SIGNIFICANT EVENTS: 7/29 Admit 8/01 VDRF  LINES/TUBES: OETT 8/1 - 8/2 (self extubation); 8/2 (reintubated by anesthesia w/ difficult airway) >> NGT 8/1 >> FOLEY >> PIV x2  ASSESSMENT / PLAN:  PULMONARY A: Acute on Chronic Hypoxic Respiratory Failure - Presumed Diastolic CHF. Acute on Chronic Hypercarbic Respiratory Failure HCAP OSA/OHS Narcolepsy EXTREMELY DIFFICULTY AIRWAY Possible COPD/Asthma Tobacco Use Disorder  P: Continuing pressure support trials & daily SBT Weaning FiO2 for Sat 88-92% Hold at PEEP 8 for Full Vent Support except on SBT w/ Sedation Vacation Continuing Duoneb q6hr Nicotine 7mg  Patch Goal I=O Volume Status  CARDIAC A: Acute Cor Pulmonale/RV Failure Acute on Chronic Diastolic CHF Hypotension - Resolved. Secondary to sedation. H/O  Hypertension  P: Monitoring on Telemetry Vitals per unit protocol Holding on diuresis today Goal MAP > 65 Holding outpatient Lisinopril  RENAL A: Hyperphosphatemia - Resolved. Uremia - Stable & likely secondary to diuresis. Acute Renal Failure - Resolved. Metabolic Alkalosis - Likely contraction. Compensated. Hypernatremia - Mild.  P: Trending UOP with Foley Plan to replace Foley 8/12 Continue Free Water 200cc VT q6hr Monitoring electrolytes & renal funciton daily Replacing electrolytes as indicated Reassess diuretics daily   GASTROENTEROLOGY A: Constipation - Resolved after SMOG. Morbid obesity   P: Continuing Tube Feedings Ranitidine 300mg  VT daily Senna & Colace VT bid  HEMATOLOGY A: Leukocytosis - Mild. Possibly reactive.  P: Trending cell counts daily w/ CBC Heparin [place] q8hr SCDs  INFECTION A: Sepsis - S/P Course of Vancomycin &  Cefepime stopped 8/7. Left Leg Cellulitis w/ 2 Blisters - Mild. LLL HCAP  FUO - Drug reaction vs UTI vs Sinusitis Probable Sinusitis Probable Complicated UTI  P: Starting Empiric Aztreonam Checking Urine Ctx today Awaiting finalization of remaining cultures Plan to re-place Foley on 8/12  ENDOCRINE A: H/O DM Type 2 - Glucose controlled.  P: Accu-Checks q4hr SSI per Resistant Algorithm Holding outpatient glucotrol & glucophage  NEUROLOGY A: Acute Encephalopathy - Multifactorial w/ metabolic, hypercarbic, & hypoxic. Improved. Sedation on Ventilator Chronic Outpatient Oxycodone & Xanax H/O Diabetic Neuropathy H/O Bipolar Disorder H/O PTSD  P: RASS goal: 0- to -1 Fentanyl gtt & IV prn Pain Weaning Propofol gtt to off Hold outpt xanax & oxycodone Neurontin 300mg  VT bid  HEENT: A: Sclera Edema/conjunctival chemosis - Improving. Glossitis - Improving. Possible Sinusitis  P: D/C Benadryl today Patanol drops bilaterally Tobramycin eye gtts started 8/9 Continue Zantac Keep HOB elevated  FAMILY UPDATE:   Mother updated by Dr. Jamison NeighborNestor on 8/10 at bedside.   TODAY'S SUMMARY:  35 y.o. male admitted with sepsis from Lt leg cellulitis.  Has hx of tobacco abuse and severe obstructive sleep apnea.  He has been non compliant with CPAP as outpt. Continuing with pressure support trials. Suspect fever is due to either sinusitis or possibly urinary tract infection given the many bacteria" seen on urinalysis. Empiric treatment with aztreonam. Discontinuing Benadryl today. As tongue swelling continues to improve he may be a candidate for extubation.  I have spent a total of 34 minutes of critical care time today caring for the patient and reviewing the patient's electronic medical record.  Donna ChristenJennings E. Jamison NeighborNestor, M.D. [facility] Pager:  [phone] After 3pm or if no response, call [phone] 8:45 AM 12/04/15

## 2015-12-04 NOTE — Progress Notes (Addendum)
Nutrition Follow-up  DOCUMENTATION CODES:   Morbid obesity  INTERVENTION:  - Continue Vital High Protein @ 65 mL/hr with 30 mL Prostat once/day.  - Continue PEPUP protocol. - Continue to follow MD/NP order for free water flush. - RD will follow-up 8/14.  NUTRITION DIAGNOSIS:   Inadequate oral intake related to inability to eat as evidenced by NPO status. -ongoing  GOAL:   Provide needs based on ASPEN/SCCM guidelines -met with current regimen.  MONITOR:   Vent status, TF tolerance, Weight trends, Labs, Skin, I & O's  ASSESSMENT:   This is a 35 year old gentleman with multiple medical issues including diabetes. He states he woke up after midnight tonight and had pain in his left lower extremity. He came to the ER. He denies any recent trauma, stating the scarring on the left lower extremity are old scars.  8/11 Pt continues with OGT and currently receiving Vital High Protein @ 65 mL/hr with 30 mL Prostat once/day which is providing 1660 kcal, 151 grams of protein, and 1304 mL free water. Estimated nutrition needs updated as pt has lost 3.6 kcal since previous assessment. Current TF order meets re-estimated needs. Free water flush of 200 mL every 6 hours (800 mL/day) ordered by MD but was not infusing at time of RD visit this AM.   Patient is currently intubated on ventilator support MV: 9.4 L/min Temp (24hrs), Avg:102 F (38.9 C), Min:101.1 F (38.4 C), Max:103.6 F (39.8 C)  Medications reviewed; 40 mg IV Lasix BID, sliding scale Novolog, PRN IV Zofran, 5 mL Senokot BID.  Labs reviewed; CBGs: 126 and 149 mg/dL this AM, Na: 149 mmol/L, BUN: 47 mg/dL.  Drips: Fentanyl @ 175 mcg/hr, Versed @ 2 mg/hr.     8/9 - Currently receiving TF at goal rate: Vital High Protein with 60 mL Prostat BID.  - This regimen is providing 1360 kcal (95% minimum estimated kcal needs), 144 grams of protein, and 803 mL free water.  - Per chart review, weight down 17.3 kg since 7/31 and kcal needs  re-estimated per ASPEN/SCCM guideline with current weight.  - Pt given stool softeners and did have a BM yesterday which was the first since admission.  Patient is currently intubated on ventilator support MV: 9.2 L/min Temp (24hrs), Avg:101.2 F (38.4 C), Min:98.6 F (37 C), Max:101.8 F (38.8 C) Propofol: off since previous assessment. Drips: Fentanyl @ 150 mcg/hr, Versed @ 7.5 mg/hr.    8/7 - No new weight since 11/23/15.  - Pt currently receiving Vital High Protein @ 50 mL/hr with 30 mL Prostat TID.  - This regimen is providing 1500 kcal, 150 grams of protein, and 1003 mL free water. No BM since admission; 5 mL Senokot BID, 100 mg Colace BID.  Patient is currently intubated on ventilator support MV: 8.7L/min Temp (24hrs), Avg:101.4 F (38.6 C), Min:100.9 F (38.3 C), Max:101.8 F (38.8 C) Propofol: 26.28m/hr (700 kcal) Drips:Propofol @ 30 mcg/kg/min, Fentanyl @ 225 mcg/hr.     Diet Order:  Diet NPO time specified  Skin:  Reviewed, no issues  Last BM:  8/9  Height:   Ht Readings from Last 1 Encounters:  11/29/15 5' 9" (1.753 m)    Weight:   Wt Readings from Last 1 Encounters:  12/04/15 278 lb (126.1 kg)    Ideal Body Weight:  72.72 kg  BMI:  Body mass index is 41.05 kg/m.  Estimated Nutritional Needs:   Kcal:  [address](11-14 kcal/kg actual weight)  Protein:  145-160 grams (2-2.2 grams/kg IBW)  Fluid:  >/= 1.6L  EDUCATION NEEDS:   No education needs identified at this time    Jarome Matin, MS, RD, LDN Inpatient Clinical Dietitian Pager # [phone] After hours/weekend pager # [phone]

## 2015-12-04 NOTE — Progress Notes (Signed)
Pharmacy Antibiotic Note  Kenneth DonningJamaine Hefferan is a 35 y.o. male with diabetes admitted on 11/21/2015 with cellulitis after recently cuttling left leg. Pharmacy has been consulted for vancomycin and cefepime dosing for HAP. Received a 10-day course of antibiotics of several antibiotics (including 7-days of vancomycin). Patient has remained febrile but fevers much worse overnight.  Previously on cefepime but developed eye swelling and tongue swelling toward end of cefepime course, swelling on unknown etiology but concerned may be related to antibitoics (ie. Cefepime) with history of childhood PCN allergy - unknown reaction.   Plan:  Aztreonam 2gm IV q8h for concern of pseudomonas infection (per d/w CCM)  No gram positive coverage thought to be needed at this time.   Follow cultures  Pharmacy to follow at distance as no further dosage requirement anticipated  Height: 5\' 9"  (175.3 cm) Weight: 278 lb (126.1 kg) IBW/kg (Calculated) : 70.7  Temp (24hrs), Avg:102 F (38.9 C), Min:101.1 F (38.4 C), Max:103.6 F (39.8 C)   Recent Labs Lab 11/30/15 0332 12/01/15 0250 12/01/15 0300 12/02/15 0334 12/03/15 0323 12/04/15 0320  WBC 10.3  --  12.0* 10.8* 10.7* 12.3*  CREATININE 1.11  --  1.25* 1.19 1.14 1.22  LATICACIDVEN  --  0.9  --   --   --   --     Estimated Creatinine Clearance: 110 mL/min (by C-G formula based on SCr of 1.22 mg/dL).    Allergies  Allergen Reactions  . Bee Venom Anaphylaxis  . Contrast Media [Iodinated Diagnostic Agents] Nausea And Vomiting       . Wasp Venom Anaphylaxis  . Cherry Other (See Comments)    unknown  . Penicillins Other (See Comments)    Childhood allergic reaction - no other information available  . Vicodin [Hydrocodone-Acetaminophen] Diarrhea   Antimicrobials this admission: 7/30 Flagyl >> 7/31 7/30 Aztreonam >> 7/31 7/31 Rocephin >> 8/1 7/30 Vanc >>  8/4, 8/5 >> 8/7 8/1 Cefepime  >> 8/7 8/11 aztreonam >>  Dose adjustments/Drug levels this  admission: 8/1 VT = 8 on 1250mg  q12 - increase 1250mg  q8h 8/2 SCr increased - hold vanc, check trough at 1722 (~16hr post dose) = 8 (repeat scr down 1.12) --> change to 1gm q8h 8/4 VT = 14 on 1g q8h, and drawn early(5:45 after end of last Vanc infusion). Increase to 1250mg  q8h.   Microbiology results: 7/30 BCx: ngf 7/30 UCx: multiple, suggest recollect 7/30 MRSA PCR: negative 8/4 trach aspirate: few yeast 8/4 blood: NGTD 8/5 trach aspirate: few yeast 8/10 BCx: pending 8/10 UCx: pending  Thank you for allowing pharmacy to be a part of this patient's care.  Juliette Alcideustin Leyla Soliz, PharmD, BCPS.   Pager: [phone] 12/04/2015 9:58 AM

## 2015-12-05 LAB — CBC WITH DIFFERENTIAL/PLATELET
BASOS PCT: 1 %
Basophils Absolute: 0.1 10*3/uL (ref 0.0–0.1)
EOS ABS: 0.3 10*3/uL (ref 0.0–0.7)
EOS PCT: 3 %
HCT: 45.7 % (ref 39.0–52.0)
HEMOGLOBIN: 13.3 g/dL (ref 13.0–17.0)
LYMPHS PCT: 21 %
Lymphs Abs: 2 10*3/uL (ref 0.7–4.0)
MCH: 27.4 pg (ref 26.0–34.0)
MCHC: 29.1 g/dL — ABNORMAL LOW (ref 30.0–36.0)
MCV: 94.2 fL (ref 78.0–100.0)
MONO ABS: 0.9 10*3/uL (ref 0.1–1.0)
Monocytes Relative: 9 %
NEUTROS PCT: 66 %
Neutro Abs: 6.2 10*3/uL (ref 1.7–7.7)
PLATELETS: 213 10*3/uL (ref 150–400)
RBC: 4.85 MIL/uL (ref 4.22–5.81)
RDW: 15.2 % (ref 11.5–15.5)
WBC: 9.5 10*3/uL (ref 4.0–10.5)

## 2015-12-05 LAB — GLUCOSE, CAPILLARY
GLUCOSE-CAPILLARY: 118 mg/dL — AB (ref 65–99)
GLUCOSE-CAPILLARY: 138 mg/dL — AB (ref 65–99)
GLUCOSE-CAPILLARY: 145 mg/dL — AB (ref 65–99)
GLUCOSE-CAPILLARY: 153 mg/dL — AB (ref 65–99)
Glucose-Capillary: 125 mg/dL — ABNORMAL HIGH (ref 65–99)
Glucose-Capillary: 144 mg/dL — ABNORMAL HIGH (ref 65–99)
Glucose-Capillary: 139 mg/dL — ABNORMAL HIGH (ref 65–99)
Glucose-Capillary: 129 mg/dL — ABNORMAL HIGH (ref 65–99)

## 2015-12-05 LAB — RENAL FUNCTION PANEL
ALBUMIN: 2.5 g/dL — AB (ref 3.5–5.0)
ANION GAP: 7 (ref 5–15)
BUN: 42 mg/dL — ABNORMAL HIGH (ref 6–20)
CALCIUM: 9.5 mg/dL (ref 8.9–10.3)
CO2: 34 mmol/L — ABNORMAL HIGH (ref 22–32)
Chloride: 108 mmol/L (ref 101–111)
Creatinine, Ser: 1.02 mg/dL (ref 0.61–1.24)
GFR calc non Af Amer: >60 mL/min (ref >60)
Glucose, Bld: 134 mg/dL — ABNORMAL HIGH (ref 65–99)
PHOSPHORUS: 4.6 mg/dL (ref 2.5–4.6)
POTASSIUM: 3.7 mmol/L (ref 3.5–5.1)
SODIUM: 149 mmol/L — AB (ref 135–145)

## 2015-12-05 LAB — CULTURE, BLOOD (ROUTINE X 2)
CULTURE: NO GROWTH
CULTURE: NO GROWTH

## 2015-12-05 LAB — MAGNESIUM: Magnesium: 1.9 mg/dL (ref 1.7–2.4)

## 2015-12-05 LAB — URINE CULTURE
CULTURE: NO GROWTH
SPECIAL REQUESTS: NORMAL

## 2015-12-05 MED ORDER — FUROSEMIDE 10 MG/ML IJ SOLN
40.0000 mg | Freq: Four times a day (QID) | INTRAMUSCULAR | Status: AC
  Administered 2015-12-05 (×2): 40 mg via INTRAVENOUS
  Filled 2015-12-05 (×2): qty 4

## 2015-12-05 NOTE — Progress Notes (Signed)
PULMONARY / CRITICAL CARE MEDICINE   Name: Kenneth DonningJamaine Mcfarland MRN: [id number] DOB: 10-12-1980    ADMISSION DATE:  June 19, 2015 CONSULTATION DATE:  11/24/2015  REFERRING MD:  Dr. Robb Matarrtiz, [facility]  CHIEF COMPLAINT:  Short of breath  SUBJECTIVE:   Tolerating some pressure support.  VITAL SIGNS: BP (!) 114/58   Pulse (!) 109   Temp (!) 100.8 F (38.2 C) (Core (Comment)) Comment (Src): foley  Resp (!) 22   Ht 5\' 9"  (1.753 m)   Wt 282 lb (127.9 kg)   SpO2 100%   BMI 41.64 kg/m   VENTILATOR SETTINGS: Vent Mode: PSV FiO2 (%):  [30 %-40 %] 30 % Set Rate:  [16 bmp] 16 bmp Vt Set:  [570 mL] 570 mL PEEP:  [5 cmH20-8 cmH20] 5 cmH20 Pressure Support:  [5 cmH20] 5 cmH20 Plateau Pressure:  [23 cmH20-27 cmH20] 24 cmH20  INTAKE / OUTPUT:  Intake/Output Summary (Last 24 hours) at 12/05/15 1000 Last data filed at 12/05/15 0600  Gross per 24 hour  Intake          2655.75 ml  Output             1575 ml  Net          1080.75 ml     PHYSICAL EXAMINATION: General: ill appearing Neuro: follows simple commands HEENT: scleral edema Cardiac: regular Chest: no wheeze Abd: obese, soft Ext: 1+ edema Skin: no rashes  LABS:  BMET  Recent Labs Lab 12/03/15 0323 12/04/15 0320 12/05/15 0406  NA 146* 149* 149*  K 3.9 4.0 3.7  CL 104 107 108  CO2 36* 35* 34*  BUN 47* 47* 42*  CREATININE 1.14 1.22 1.02  GLUCOSE 144* 125* 134*    Electrolytes  Recent Labs Lab 12/03/15 0323 12/04/15 0320 12/05/15 0406  CALCIUM 9.2 9.3 9.5  MG 2.0 1.9 1.9  PHOS 4.8* 4.6 4.6    CBC  Recent Labs Lab 12/03/15 0323 12/04/15 0320 12/05/15 0406  WBC 10.7* 12.3* 9.5  HGB 13.0 12.7* 13.3  HCT 43.3 43.1 45.7  PLT 239 226 213    Coag's No results for input(s): APTT, INR in the last 168 hours.  Sepsis Markers  Recent Labs Lab 12/01/15 0250 12/02/15 0334 12/03/15 0323  LATICACIDVEN 0.9  --   --   PROCALCITON 0.51 0.31 0.21    ABG  Recent Labs Lab 11/29/15 0445 12/01/15 0402  PHART  7.432 7.383  PCO2ART 71.8* 65.4*  PO2ART 104* 84.3    Liver Enzymes  Recent Labs Lab 12/01/15 0300  12/03/15 0323 12/04/15 0320 12/05/15 0406  AST 73*  --   --   --   --   ALT 45  --   --   --   --   ALKPHOS 81  --   --   --   --   BILITOT 0.4  --   --   --   --   ALBUMIN 2.6*  < > 2.5* 2.3* 2.5*  < > = values in this interval not displayed.  Cardiac Enzymes No results for input(s): TROPONINI, PROBNP in the last 168 hours.  Glucose  Recent Labs Lab 12/04/15 1230 12/04/15 1642 12/04/15 1932 12/04/15 2333 12/05/15 0345 12/05/15 0747  GLUCAP 153* 109* 118* 153* 125* 145*    Imaging No results found.   STUDIES:  PSG 03/03/15: AHI 108, SpO2 low 60%.  CPAP 16 >> AHI 0 Doppler Lt leg 11/23/15: negative for DVT, limited study TTE 11/24/15: mod LVH, EF 65  to 70%, mod RV dilation, mod TR, PAS 85 mmHg  MICROBIOLOGY: Urine Ctx 7/30:  Multiple Species Blood Ctx x2 7/30:  Negative  MRSA PCR 7/30:  Negative  Tracheal Asp Ctx 8/4: Few C albicans  Blood Ctx x2 8/4:  Negative  Tracheal Asp Ctx 8/5: Few C albicans Blood Ctx x2 8/7 >> Tracheal Asp Ctx 8/7:  Normal flora Blood Ctx 8/10 >> Tracheal Asp Ctx 8/10 >> Urine Ctx 8/11 >>  ANTIBIOTICS: Aztreonam 7/29 - 7/30 Flagyl 7/29 - 7/30 Rocephin 7/32 - 8/1 Vancomycin 7/29 - 8/7 Cefepime 8/1 - 8/7 Aztreonam 8/11 >>  SIGNIFICANT EVENTS: 7/29 Admit 8/01 VDRF  LINES/TUBES: ETT 8/1 - 8/2 (self extubation); 8/2 (reintubated by anesthesia w/ difficult airway) >>   ASSESSMENT / PLAN:  PULMONARY A: Acute on Chronic Hypoxic Respiratory Failure - Presumed Diastolic CHF, HCAP, OSA/OHS. EXTREMELY DIFFICULTY AIRWAY Possible COPD/Asthma Tobacco Use Disorder P: Pressure support wean  Likely will need trach F/u CXR Scheduled BDs Nicotine patch  CARDIAC A: Acute Cor Pulmonale/RV Failure. Acute on Chronic Diastolic CHF. H/O Hypertension. P: Lasix 40 mg IV x 2 on 8/12  RENAL A: Hypernatremia - mild. P: F/u  BMET Continue free water  GASTROENTEROLOGY A: Constipation - Resolved after SMOG. Morbid obesity  P: Continuing Tube Feedings Ranitidine  VT daily Senna & Colace VT bid  HEMATOLOGY A: Leukocytosis. P: F/u CBC Heparin [place] q8hr SCDs  INFECTION A: Sepsis - S/P Course of Vancomycin & Cefepime stopped 8/7. Left Leg Cellulitis w/ 2 Blisters - Mild. LLL HCAP. FUO - Drug reaction vs UTI vs Sinusitis. P: Day 2 aztreonam  ENDOCRINE A: H/O DM Type 2 - Glucose controlled. P: Accu-Checks q4hr SSI per Resistant Algorithm Holding outpatient glucotrol & glucophage  NEUROLOGY A: Acute Encephalopathy - Multifactorial w/ metabolic, hypercarbic, & hypoxic. Improved. Sedation on Ventilator Chronic Outpatient Oxycodone & Xanax H/O Diabetic Neuropathy H/O Bipolar Disorder H/O PTSD P: RASS goal: 0- to -1 Fentanyl gtt & IV prn Pain Weaning Propofol gtt to off Hold outpt xanax & oxycodone Neurontin  VT bid  HEENT: A: Sclera Edema/conjunctival chemosis - Improving. Glossitis - Improving. Possible Sinusitis. P: Patanol drops bilaterally Tobramycin eye gtts started 8/9 Continue Zantac Keep HOB elevated  CC time 32 minutes.  Coralyn Helling, MD [facility] Pulmonary/Critical Care 12/05/2015, 10:06 AM Pager:  [phone] After 3pm call: [phone]

## 2015-12-05 NOTE — Progress Notes (Signed)
eLink Physician-Brief Progress Note Patient Name: Kenneth Mcfarland DOB: [birth year] MRN: [id number]   Date of Service  12/05/2015  HPI/Events of Note  Agitation - request to renew soft wrist restraints.   eICU Interventions  Will renew soft wrist restraints.      Intervention Category Minor Interventions: Clinical assessment - ordering diagnostic tests  Lenell AntuSommer,Steven Eugene 12/05/2015, 9:34 PM

## 2015-12-06 ENCOUNTER — Inpatient Hospital Stay (HOSPITAL_COMMUNITY): Payer: Medicaid Other

## 2015-12-06 LAB — RENAL FUNCTION PANEL
Albumin: 2.5 g/dL — ABNORMAL LOW (ref 3.5–5.0)
Anion gap: 7 (ref 5–15)
BUN: 39 mg/dL — ABNORMAL HIGH (ref 6–20)
CHLORIDE: 109 mmol/L (ref 101–111)
CO2: 32 mmol/L (ref 22–32)
Calcium: 9.2 mg/dL (ref 8.9–10.3)
Creatinine, Ser: 1.08 mg/dL (ref 0.61–1.24)
Glucose, Bld: 140 mg/dL — ABNORMAL HIGH (ref 65–99)
POTASSIUM: 3.8 mmol/L (ref 3.5–5.1)
Phosphorus: 5.2 mg/dL — ABNORMAL HIGH (ref 2.5–4.6)
Sodium: 148 mmol/L — ABNORMAL HIGH (ref 135–145)

## 2015-12-06 LAB — CBC
HEMATOCRIT: 43.6 % (ref 39.0–52.0)
Hemoglobin: 12.7 g/dL — ABNORMAL LOW (ref 13.0–17.0)
MCH: 27.5 pg (ref 26.0–34.0)
MCHC: 29.1 g/dL — ABNORMAL LOW (ref 30.0–36.0)
MCV: 94.6 fL (ref 78.0–100.0)
PLATELETS: 212 10*3/uL (ref 150–400)
RBC: 4.61 MIL/uL (ref 4.22–5.81)
RDW: 15.3 % (ref 11.5–15.5)
WBC: 11.7 10*3/uL — AB (ref 4.0–10.5)

## 2015-12-06 LAB — CULTURE, RESPIRATORY: CULTURE: NO GROWTH

## 2015-12-06 LAB — MAGNESIUM: Magnesium: 1.8 mg/dL (ref 1.7–2.4)

## 2015-12-06 LAB — GLUCOSE, CAPILLARY
GLUCOSE-CAPILLARY: 142 mg/dL — AB (ref 65–99)
GLUCOSE-CAPILLARY: 134 mg/dL — AB (ref 65–99)
Glucose-Capillary: 134 mg/dL — ABNORMAL HIGH (ref 65–99)
Glucose-Capillary: 142 mg/dL — ABNORMAL HIGH (ref 65–99)
Glucose-Capillary: 132 mg/dL — ABNORMAL HIGH (ref 65–99)

## 2015-12-06 MED ORDER — FUROSEMIDE 10 MG/ML IJ SOLN
40.0000 mg | Freq: Four times a day (QID) | INTRAMUSCULAR | Status: AC
Start: 2015-12-06 — End: 2015-12-06
  Administered 2015-12-06 (×2): 40 mg via INTRAVENOUS
  Filled 2015-12-06 (×2): qty 4

## 2015-12-06 NOTE — Progress Notes (Signed)
eLink Physician-Brief Progress Note Patient Name: Kenneth DonningJamaine Collingsworth DOB: March 25, 1981 MRN: [id number]   Date of Service  12/06/2015  HPI/Events of Note  Agitation - restraint order has expired.   eICU Interventions  Will renew bilateral soft wrist restraint order.     Intervention Category Minor Interventions: Agitation / anxiety - evaluation and management  Lenell AntuSommer,Steven Eugene 12/06/2015, 11:56 PM

## 2015-12-06 NOTE — Progress Notes (Signed)
PULMONARY / CRITICAL CARE MEDICINE   Name: Murvin DonningJamaine Garlick MRN: [id number] DOB: [birth year]    ADMISSION DATE:  2015-12-05 CONSULTATION DATE:  11/24/2015  REFERRING MD:  Dr. Robb Matarrtiz, [facility]  CHIEF COMPLAINT:  Short of breath  SUBJECTIVE:   Tolerating some pressure support.  VITAL SIGNS: BP 129/62   Pulse (!) 103   Temp (!) 100.7 F (38.2 C) (Oral)   Resp 18   Ht 5\' 9"  (1.753 m)   Wt 270 lb (122.5 kg)   SpO2 94%   BMI 39.87 kg/m   VENTILATOR SETTINGS: Vent Mode: PSV FiO2 (%):  [30 %-40 %] 40 % Set Rate:  [16 bmp] 16 bmp Vt Set:  [570 mL] 570 mL PEEP:  [5 cmH20] 5 cmH20 Pressure Support:  [8 cmH20] 8 cmH20 Plateau Pressure:  [22 cmH20-25 cmH20] 25 cmH20  INTAKE / OUTPUT:  Intake/Output Summary (Last 24 hours) at 12/06/15 0945 Last data filed at 12/06/15 0847  Gross per 24 hour  Intake          2569.27 ml  Output             2125 ml  Net           444.27 ml     PHYSICAL EXAMINATION: General: ill appearing Neuro: follows simple commands HEENT: scleral edema Lt > Rt Cardiac: regular Chest: no wheeze Abd: obese, soft Ext: 1+ edema Skin: no rashes  LABS:  BMET  Recent Labs Lab 12/04/15 0320 12/05/15 0406 12/06/15 0346  NA 149* 149* 148*  K 4.0 3.7 3.8  CL 107 108 109  CO2 35* 34* 32  BUN 47* 42* 39*  CREATININE 1.22 1.02 1.08  GLUCOSE 125* 134* 140*    Electrolytes  Recent Labs Lab 12/04/15 0320 12/05/15 0406 12/06/15 0346  CALCIUM 9.3 9.5 9.2  MG 1.9 1.9 1.8  PHOS 4.6 4.6 5.2*    CBC  Recent Labs Lab 12/04/15 0320 12/05/15 0406 12/06/15 0346  WBC 12.3* 9.5 11.7*  HGB 12.7* 13.3 12.7*  HCT 43.1 45.7 43.6  PLT 226 213 212    Coag's No results for input(s): APTT, INR in the last 168 hours.  Sepsis Markers  Recent Labs Lab 12/01/15 0250 12/02/15 0334 12/03/15 0323  LATICACIDVEN 0.9  --   --   PROCALCITON 0.51 0.31 0.21    ABG  Recent Labs Lab 12/01/15 0402  PHART 7.383  PCO2ART 65.4*  PO2ART 84.3    Liver  Enzymes  Recent Labs Lab 12/01/15 0300  12/04/15 0320 12/05/15 0406 12/06/15 0346  AST 73*  --   --   --   --   ALT 45  --   --   --   --   ALKPHOS 81  --   --   --   --   BILITOT 0.4  --   --   --   --   ALBUMIN 2.6*  < > 2.3* 2.5* 2.5*  < > = values in this interval not displayed.  Cardiac Enzymes No results for input(s): TROPONINI, PROBNP in the last 168 hours.  Glucose  Recent Labs Lab 12/05/15 1157 12/05/15 1523 12/05/15 1923 12/05/15 2326 12/06/15 0312 12/06/15 0805  GLUCAP 144* 139* 138* 129* 134* 142*    Imaging Dg Chest Port 1 View  Result Date: 12/06/2015 CLINICAL DATA:  Followup for respiratory failure. EXAM: PORTABLE CHEST 1 VIEW COMPARISON:  12/03/2015 FINDINGS: There has been improvement with near complete clearing of the left lung. Mild hazy opacity right lung  has improved. The hemidiaphragms are now visible. No new lung abnormalities.  No pneumothorax. Endotracheal tube and nasogastric tube are stable in well positioned. IMPRESSION: 1. Improved lung aeration since the prior exam. No new abnormalities. 2. Support apparatus well positioned. Electronically Signed   By: Amie Portland M.D.   On: 12/06/2015 07:29     STUDIES:  PSG 03/03/15 >> AHI 108, SpO2 low 60%.  CPAP 16 >> AHI 0 Doppler Lt leg 11/23/15 >> negative for DVT, limited study TTE 11/24/15 >> mod LVH, EF 65 to 70%, mod RV dilation, mod TR, PAS 85 mmHg  MICROBIOLOGY: Urine Ctx 7/30:  Multiple Species Blood Ctx x2 7/30:  Negative  MRSA PCR 7/30:  Negative  Tracheal Asp Ctx 8/4: Few C albicans  Blood Ctx x2 8/4:  Negative  Tracheal Asp Ctx 8/5: Few C albicans Blood Ctx x2 8/7 >> Tracheal Asp Ctx 8/7:  Normal flora Blood Ctx 8/10 >> Tracheal Asp Ctx 8/10 >> Urine Ctx 8/11 >>  ANTIBIOTICS: Aztreonam 7/29 - 7/30 Flagyl 7/29 - 7/30 Rocephin 7/32 - 8/1 Vancomycin 7/29 - 8/7 Cefepime 8/1 - 8/7 Aztreonam 8/11 >>  SIGNIFICANT EVENTS: 7/29 Admit 8/01 VDRF  LINES/TUBES: ETT 8/1 - 8/2 (self  extubation); 8/2 (reintubated by anesthesia w/ difficult airway) >>   ASSESSMENT / PLAN:  PULMONARY A: Acute on Chronic Hypoxic Respiratory Failure - Presumed Diastolic CHF, HCAP, OSA/OHS. EXTREMELY DIFFICULTY AIRWAY Possible COPD/Asthma Tobacco Use Disorder P: Pressure support wean  Might need trach F/u CXR Scheduled BDs Nicotine patch  CARDIAC A: Acute Cor Pulmonale/RV Failure. Acute on Chronic Diastolic CHF. H/O Hypertension. P: Lasix 40 mg IV x 2 on 8/13  RENAL A: Hypernatremia - mild. P: F/u BMET Continue free water  GASTROENTEROLOGY A: Constipation - Resolved after SMOG. Morbid obesity  P: Continuing Tube Feedings Ranitidine  VT daily Senna & Colace VT bid  HEMATOLOGY A: Leukocytosis. P: F/u CBC Heparin [place] q8hr SCDs  INFECTION A: Sepsis - S/P Course of Vancomycin & Cefepime stopped 8/7. Left Leg Cellulitis w/ 2 Blisters - Mild. LLL HCAP. FUO - Drug reaction vs UTI vs Sinusitis. P: Day 3 aztreonam  ENDOCRINE A: H/O DM Type 2 - Glucose controlled. P: Accu-Checks q4hr SSI per Resistant Algorithm Holding outpatient glucotrol & glucophage  NEUROLOGY A: Acute Encephalopathy - Multifactorial w/ metabolic, hypercarbic, & hypoxic. Improved. Sedation on Ventilator Chronic Outpatient Oxycodone & Xanax H/O Diabetic Neuropathy H/O Bipolar Disorder H/O PTSD P: RASS goal: 0- to -1 Fentanyl gtt & IV prn Pain Hold outpt xanax & oxycodone Neurontin  VT bid  HEENT: A: Sclera Edema/conjunctival chemosis - Improving. Glossitis - Improving. Possible Sinusitis. P: Patanol drops bilaterally Tobramycin eye gtts started 8/9 Continue Zantac Keep HOB elevated  Update pt's fiancee at bedside.  CC time 32 minutes.  Coralyn Helling, MD [facility] Pulmonary/Critical Care 12/06/2015, 9:45 AM Pager:  [phone] After 3pm call: [phone]

## 2015-12-07 ENCOUNTER — Inpatient Hospital Stay (HOSPITAL_COMMUNITY): Payer: Medicaid Other

## 2015-12-07 LAB — BASIC METABOLIC PANEL
ANION GAP: 11 (ref 5–15)
Anion gap: 7 (ref 5–15)
BUN: 43 mg/dL — AB (ref 6–20)
BUN: 61 mg/dL — ABNORMAL HIGH (ref 6–20)
CO2: 30 mmol/L (ref 22–32)
CO2: 29 mmol/L (ref 22–32)
Calcium: 9.1 mg/dL (ref 8.9–10.3)
Calcium: 9.3 mg/dL (ref 8.9–10.3)
Chloride: 109 mmol/L (ref 101–111)
Chloride: 107 mmol/L (ref 101–111)
Creatinine, Ser: 1.12 mg/dL (ref 0.61–1.24)
Creatinine, Ser: 2.37 mg/dL — ABNORMAL HIGH (ref 0.61–1.24)
GFR calc Af Amer: >60 mL/min (ref >60)
GFR calc Af Amer: 39 mL/min — ABNORMAL LOW (ref >60)
GFR, EST NON AFRICAN AMERICAN: 34 mL/min — AB (ref >60)
Glucose, Bld: 129 mg/dL — ABNORMAL HIGH (ref 65–99)
Glucose, Bld: 171 mg/dL — ABNORMAL HIGH (ref 65–99)
POTASSIUM: 4.5 mmol/L (ref 3.5–5.1)
POTASSIUM: 5.9 mmol/L — AB (ref 3.5–5.1)
SODIUM: 146 mmol/L — AB (ref 135–145)
SODIUM: 147 mmol/L — AB (ref 135–145)

## 2015-12-07 LAB — CBC
HCT: 46.8 % (ref 39.0–52.0)
HEMATOCRIT: 44.8 % (ref 39.0–52.0)
Hemoglobin: 13.1 g/dL (ref 13.0–17.0)
Hemoglobin: 13.7 g/dL (ref 13.0–17.0)
MCH: 27.6 pg (ref 26.0–34.0)
MCH: 28.1 pg (ref 26.0–34.0)
MCHC: 29.2 g/dL — ABNORMAL LOW (ref 30.0–36.0)
MCHC: 29.3 g/dL — ABNORMAL LOW (ref 30.0–36.0)
MCV: 94.3 fL (ref 78.0–100.0)
MCV: 96.1 fL (ref 78.0–100.0)
PLATELETS: 201 10*3/uL (ref 150–400)
Platelets: 171 10*3/uL (ref 150–400)
RBC: 4.75 MIL/uL (ref 4.22–5.81)
RBC: 4.87 MIL/uL (ref 4.22–5.81)
RDW: 15.2 % (ref 11.5–15.5)
RDW: 15.3 % (ref 11.5–15.5)
WBC: 13 10*3/uL — AB (ref 4.0–10.5)
WBC: 11.8 10*3/uL — AB (ref 4.0–10.5)

## 2015-12-07 LAB — PROTIME-INR
INR: 1.35
Prothrombin Time: 16.8 seconds — ABNORMAL HIGH (ref 11.4–15.2)

## 2015-12-07 LAB — GLUCOSE, CAPILLARY
GLUCOSE-CAPILLARY: 170 mg/dL — AB (ref 65–99)
GLUCOSE-CAPILLARY: 186 mg/dL — AB (ref 65–99)
Glucose-Capillary: 123 mg/dL — ABNORMAL HIGH (ref 65–99)
Glucose-Capillary: 205 mg/dL — ABNORMAL HIGH (ref 65–99)

## 2015-12-07 LAB — APTT: APTT: 32 s (ref 24–36)

## 2015-12-07 LAB — LACTIC ACID, PLASMA: LACTIC ACID, VENOUS: 2.4 mmol/L — AB (ref 0.5–1.9)

## 2015-12-07 MED ORDER — NYSTATIN 100000 UNIT/GM EX POWD
Freq: Two times a day (BID) | CUTANEOUS | Status: DC
  Administered 2015-12-07: 10:00:00 via TOPICAL
  Filled 2015-12-07: qty 15

## 2015-12-07 MED ORDER — ACETAMINOPHEN 160 MG/5ML PO SOLN
325.0000 mg | Freq: Once | ORAL | Status: AC
  Administered 2015-12-07: 325 mg via ORAL
  Filled 2015-12-07: qty 20.3

## 2015-12-07 MED ORDER — SODIUM CHLORIDE 0.9 % IV BOLUS (SEPSIS)
500.0000 mL | Freq: Once | INTRAVENOUS | Status: AC
Start: 2015-12-07 — End: 2015-12-07
  Administered 2015-12-07: 500 mL via INTRAVENOUS

## 2015-12-07 MED ORDER — SODIUM BICARBONATE 8.4 % IV SOLN
INTRAVENOUS | Status: AC
  Filled 2015-12-07: qty 50

## 2015-12-07 MED ORDER — IBUPROFEN 100 MG/5ML PO SUSP
200.0000 mg | Freq: Once | ORAL | Status: AC
  Administered 2015-12-07: 200 mg
  Filled 2015-12-07: qty 10

## 2015-12-07 MED ORDER — SODIUM CHLORIDE 0.9 % IV SOLN: INTRAVENOUS | Status: DC

## 2015-12-07 MED FILL — Medication: Qty: 1 | Status: AC

## 2015-12-08 LAB — CULTURE, BLOOD (ROUTINE X 2)
Culture: NO GROWTH
Culture: NO GROWTH

## 2015-12-14 ENCOUNTER — Ambulatory Visit: Payer: Self-pay | Admitting: Skilled Nursing Facility1

## 2015-12-16 ENCOUNTER — Telehealth: Payer: Self-pay

## 2015-12-16 NOTE — Telephone Encounter (Signed)
On 12/16/2015 I received a death certificate from [facility] (original). The death certificate is for burial. The patient is a patient of Doctor FedExDe Dios. [id number] this am for signature.  On 12/17/2015 I received the death certificate back from Doctor Dios. I got the death certificate ready and called the funeral home to let them know the death certificate was [facility] per the [facility] home request.

## 2015-12-25 NOTE — Progress Notes (Signed)
   [facility] PCCM  Pt with persistent hypotension.  BP was 90 systolic this am. HR in 150s. Regular. RR 20s. O2 sats 94% on 100 Fio2. T 101 Sedated. Does not follow commands. Comfortable. Not in distress.  (-) subq crepitus noted.  Dec BS on BLF. Crackles at bases. (-) wheezing. (-) rhonchi.  Tachycardic. (-) s3/m/r/g. Distended abd. Soft. Warm extremities. Gr 1 edema. Trace pulse.   We did bedside US of lung to check for PTX as we were waiting on stat CXR.  Sliding lung seen in R ant and L ant chest. With Ultrasound  use, I was able to visualize the IVC and it was not collapsible. Around 2-3 cm and did not vary with inspiration.   Differentials for hypotension : 1. Could be volume issue. He just received 1L bolus of NS. Even w/o IVC variability, I still think he is volume depleted. Will start maintenance IVF at 50 mls/hr > may increase accordingly.  2. Related to sedation and meds. Pt was on versed 6 mg IV drip  and fentanyl drip at 200 mcg/hr. We decreased versed to 2mg /hr and decreased fentanyl to 100 mcg/hr. 3. R/O PTX. Stat CXR > still waiting on it.  4. Doubt worsening sepsis. Still on abx.  5. Will get stat CBC, BMP, lactate, PT/INR/PTT.    Pollie MeyerJ. Angelo A de Dios, MD 2015-05-22, 1:23 PM [facility] Pager [phone] After 3 pm or if no answer, call [phone]

## 2015-12-25 NOTE — Progress Notes (Signed)
   LB PCCM  Immediately since my last note, BP improved to 110-120/60-70 systolic. HR in 120s. He eventually got 1.5 L IVF as bolus.  His sedatives were also dereased > versed dec to 2 mg/hr and fentanyl drip to 100 mcg/hr.   Prior to code, per RN, his HR quickly dropped from 120 to 60s to 30s. It was a wide complex rhythym then he went into flatline. Code Blue was called. Pls see separate records.   Labs from this am showed AKI with Creat at 2.37 and K was only 5.9. CBC was stable. Lactate was 2.4. INR was 1.35. Meds were given for hyperK for the code (pls see separate note). Dr. Rubin PayorPickering from ED administered the code together with [name] NP. Massive PE was considered for the cause of arrest and heparin drip +/- tPA were considered. Code was called after 25 minutes. No recovery was seen and he stayed flatline throughout.   I called up pt's mother Westley HummerCharlene and discussed with her events leading to the code.  I also discussed with pt's aunt regarding events leading to the code.   Please provide post mortem care.    Pollie MeyerJ. Angelo A de Dios, MD 12/08/2015, 3:22 PM [facility] Pager [phone] After 3 pm or if no answer, call [phone]

## 2015-12-25 NOTE — Progress Notes (Signed)
eLink Physician-Brief Progress Note Patient Name: Kenneth DonningJamaine Mcfarland DOB: [birth year] MRN: [id number]   Date of Service  11-08-2015  HPI/Events of Note  Hypoxemia - Desaturation into 80's. Has has copious secretions overnight. Sats slowly improved to 92% by bagging patient with PEEP = 15. CXR this AM with improved aerations and no pneumothorax.   eICU Interventions  Will order: 1. Increase PEEP to 16. 2. Portable CXR STAT.        Chenita Ruda Dennard Nipugene 11-08-2015, 6:48 AM

## 2015-12-25 NOTE — Consult Note (Signed)
WOC Nurse wound consult note Reason for Consult: skin care to intertriginous dermatitis in the skin folds, sub pannicular, inguinal Wound type:moisture associated skin damage, specifically intertriginous dermatitis Pressure Ulcer POA: No Drainage (amount, consistency, odor) scant serous Periwound:intact, erythematous Dressing procedure/placement/frequency: I will provide patient with antimicrobial textile used for absorption and evaporation of excess moisture in the skin folds, this is to be used without antifungal powders or creams.  If desired, consider oral or IV dose of systemic antifungal, e.g., diflucan. WOC nursing team will not follow, but will remain available to this patient, the nursing and medical teams.  Please re-consult if needed. Thanks, Ladona MowLaurie Ashanti Littles, MSN, RN, GNP, Hans EdenCWOCN, CWON-AP, FAAN  Pager# [phone]

## 2015-12-25 NOTE — Progress Notes (Signed)
PULMONARY / CRITICAL CARE MEDICINE   Name: Kenneth Mcfarland MRN: [id number] DOB: 1981/02/07    ADMISSION DATE:  11/07/2015 CONSULTATION DATE:  11/24/2015  REFERRING MD:  Dr. Robb Matar, [facility]  CHIEF COMPLAINT:  Short of breath  SUBJECTIVE:   Episode of desaturation early am with bath to the 80's, PEEP increased to 16 and FiO2 increased to 100%. Pt was ambubagged. o2 sats improved. CXR with no acute findings. (-) PTX seen.   Tmax 102.6.  Net negative 600 from lasix  VITAL SIGNS: BP 112/68 (BP Location: Left Arm)   Pulse (!) 140   Temp (!) 102.6 F (39.2 C) (Oral) Comment: RN notified  Resp (!) 26   Ht 5\' 9"  (1.753 m)   Wt 288 lb 12.8 oz (131 kg)   SpO2 94%   BMI 42.65 kg/m   VENTILATOR SETTINGS: Vent Mode: PRVC FiO2 (%):  [40 %-100 %] 100 % Set Rate:  [16 bmp] 16 bmp Vt Set:  [570 mL] 570 mL PEEP:  [5 cmH20-16 cmH20] 16 cmH20 Pressure Support:  [8 cmH20] 8 cmH20 Plateau Pressure:  [21 cmH20-31 cmH20] 31 cmH20  INTAKE / OUTPUT:  Intake/Output Summary (Last 24 hours) at 12/16/15 4098 Last data filed at December 16, 2015 0800  Gross per 24 hour  Intake          1795.57 ml  Output             2610 ml  Net          -814.43 ml     PHYSICAL EXAMINATION: General: ill appearing obese male Neuro: follows simple commands. Sedated.  HEENT: scleral edema Lt > Rt Cardiac: regular, tachy Chest: non-labored, lungs bilaterally coarse Abd: obese, soft Ext: 1+ edema Skin: no rashes  LABS:  BMET  Recent Labs Lab 12/05/15 0406 12/06/15 0346 2015/12/16 0331  NA 149* 148* 146*  K 3.7 3.8 4.5  CL 108 109 109  CO2 34* 32 30  BUN 42* 39* 43*  CREATININE 1.02 1.08 1.12  GLUCOSE 134* 140* 129*    Electrolytes  Recent Labs Lab 12/04/15 0320 12/05/15 0406 12/06/15 0346 12-16-15 0331  CALCIUM 9.3 9.5 9.2 9.1  MG 1.9 1.9 1.8  --   PHOS 4.6 4.6 5.2*  --     CBC  Recent Labs Lab 12/05/15 0406 12/06/15 0346 December 16, 2015 0331  WBC 9.5 11.7* 13.0*  HGB 13.3 12.7* 13.1  HCT 45.7  43.6 44.8  PLT 213 212 201    Coag's No results for input(s): APTT, INR in the last 168 hours.  Sepsis Markers  Recent Labs Lab 12/01/15 0250 12/02/15 0334 12/03/15 0323  LATICACIDVEN 0.9  --   --   PROCALCITON 0.51 0.31 0.21    ABG  Recent Labs Lab 12/01/15 0402  PHART 7.383  PCO2ART 65.4*  PO2ART 84.3    Liver Enzymes  Recent Labs Lab 12/01/15 0300  12/04/15 0320 12/05/15 0406 12/06/15 0346  AST 73*  --   --   --   --   ALT 45  --   --   --   --   ALKPHOS 81  --   --   --   --   BILITOT 0.4  --   --   --   --   ALBUMIN 2.6*  < > 2.3* 2.5* 2.5*  < > = values in this interval not displayed.  Cardiac Enzymes No results for input(s): TROPONINI, PROBNP in the last 168 hours.  Glucose  Recent Labs Lab 12/06/15 1150  12/06/15 1614 12/06/15 1935 12/06/15 2325 12/15/2015 0503 12/06/2015 0738  GLUCAP 142* 132* 134* 123* 170* 186*    Imaging Dg Chest Port 1 View  Result Date: 12/12/2015 CLINICAL DATA:  35 year old male with a history of decreasing oxygen saturation EXAM: PORTABLE CHEST 1 VIEW COMPARISON:  12/03/2015, 12/06/2015 FINDINGS: Cardiomediastinal silhouette unchanged. Unchanged endotracheal tube terminating approximately 5 cm-6 cm above the carina. Gastric tube projects over the mediastinum, unchanged. Similar appearance of basilar predominant mixed interstitial and airspace disease, extending from the hilar regions peripherally. No pneumothorax. Blunting of left costophrenic angle and opacification the retrocardiac region. IMPRESSION: Similar appearance of the prior chest x-ray with persistent mixed interstitial and airspace disease, potentially combination of atelectasis, consolidation, and/ or edema. Small pleural effusions not excluded. Unchanged endotracheal tube and gastric tube. Signed, Yvone NeuJaime S. Loreta AveWagner, DO [facility] Electronically Signed   By: Gilmer MorJaime  Wagner D.O.   On: 12/01/2015 07:31   Dg  Chest Port 1 View  Result Date: 11/24/2015 CLINICAL DATA:  Respiratory failure. EXAM: PORTABLE CHEST 1 VIEW COMPARISON:  12/06/2015.  Eighteen 2017.  11/27/2015.  01/25/2015. FINDINGS: Endotracheal tube and NG tube in stable position. Stable cardiomegaly. Rounded fullness noted projected over the left hilar region. This may be related to prominent pulmonary pulmonary artery. Follow-up chest x-rays suggested for further evaluation, PA and lateral chest x-ray suggested for further evaluation when the patient is clinically capable. Bibasilar atelectasis and/or infiltrates. Small left pleural effusion cannot be excluded. No pneumothorax. IMPRESSION: 1.  Lines and tubes in stable position. 2. Stable cardiomegaly. Rounded fullness noted projected over the left hilum. Follow-up chest x-rays suggested for further evaluation. PA and lateral chest x-ray is suggested for further evaluation when the patient is clinically capable. 3. Low lung volumes with progressive bibasilar atelectasis and/or infiltrates. Electronically Signed   By: Maisie Fushomas  Register   On: 11/25/2015 06:37   Dg Chest Port 1 View  Result Date: 12/06/2015 CLINICAL DATA:  Followup for respiratory failure. EXAM: PORTABLE CHEST 1 VIEW COMPARISON:  12/03/2015 FINDINGS: There has been improvement with near complete clearing of the left lung. Mild hazy opacity right lung has improved. The hemidiaphragms are now visible. No new lung abnormalities.  No pneumothorax. Endotracheal tube and nasogastric tube are stable in well positioned. IMPRESSION: 1. Improved lung aeration since the prior exam. No new abnormalities. 2. Support apparatus well positioned. Electronically Signed   By: Amie Portlandavid  Ormond M.D.   On: 12/06/2015 07:29     STUDIES:  PSG 03/03/15 >> AHI 108, SpO2 low 60%.  CPAP 16 >> AHI 0 Doppler Lt leg 11/23/15 >> negative for DVT, limited study TTE 11/24/15 >> mod LVH, EF 65 to 70%, mod RV dilation, mod TR, PAS 85 mmHg  MICROBIOLOGY: Urine Ctx 7/30:   Multiple Species Blood Ctx x2 7/30:  Negative  MRSA PCR 7/30:  Negative  Tracheal Asp Ctx 8/4: Few C albicans  Blood Ctx x2 8/4:  Negative  Tracheal Asp Ctx 8/5: Few C albicans Blood Ctx x2 8/7:  Negative Tracheal Asp Ctx 8/7:  Normal flora Blood Ctx 8/10 >>  Tracheal Asp Ctx 8/10:  Negative  Urine Ctx 8/11:  Negative   ANTIBIOTICS: Aztreonam 7/29 - 7/30 Flagyl 7/29 - 7/30 Rocephin 7/30 - 8/1 Vancomycin 7/29 - 8/7 Cefepime 8/1 - 8/7 Aztreonam 8/11 >>  SIGNIFICANT EVENTS: 7/29  Admit 8/01  VDRF 8/02  Self extubation, reintubated per anesthesia  LINES/TUBES: ETT 8/1 - 8/2 (self extubation); 8/2 (reintubated by anesthesia w/ difficult  airway) >>   ASSESSMENT / PLAN:  PULMONARY A: Acute on Chronic Hypoxic Respiratory Failure - Presumed Diastolic CHF, HCAP, OSA/OHS. EXTREMELY DIFFICULTY AIRWAY Possible COPD/Asthma Tobacco Use Disorder P: Pt desaturated this am during bath > improved with ambubagging. CXR with no new findings. PEEP increased to 16 and 100%Fio2 >> slowly cutting down PEEP this am. Currently at PEEP of 10 and sats are 97%.  Goal for PEEP of 8 and FiO2 40-50% in order to do tracheostomy.  Pt has been intubated 16 days and has a difficult airway and has chronic hypercapnea 2/2 untrated OSA/OHS.  I recommended tracheostomy once on lower PEEP and FiO2 to facilitate weaning from the vent.  Will consult ENT for trache once mother consents to it.  Daily PST but plan NOT to extubate for now.  Scheduled BDs Nicotine patch  CARDIAC A: Acute Cor Pulmonale/RV Failure. Acute on Chronic Diastolic CHF. Sinus tachycardia.  H/O Hypertension. P: Tachycardia could be 2/2 diuresis. (-) 11L since admission. Edema is better. Has azotemia now.  Will d/c lasix. Cont free water.  Tele monitoring  RENAL A: Azotemia P: Will keep Is and os even.  Will d/c lasix. Cont free water.  Observe lytes.  Replace electrolytes as indicated  GASTROENTEROLOGY A: Constipation -  Resolved after SMOG. Morbid obesity  P: Continue Tube Feedings Ranitidine 300mg  VT daily Senna & Colace VT bid  HEMATOLOGY A: Leukocytosis. P: F/u CBC Heparin [place] q8hr SCDs  INFECTION A: Sepsis - S/P Course of Vancomycin & Cefepime stopped 8/7. Left Leg Cellulitis w/ 2 Blisters - Mild. LLL HCAP. FUO - Drug reaction vs Sinusitis. P: Day 4 aztreonam. Plan for 5days aztreonam then d/c.  Fever could be drug fever as well. Cultures have been (-).   ENDOCRINE A: H/O DM Type 2 - Glucose controlled. P: Accu-Checks q4hr SSI per Resistant Algorithm Holding outpatient glucotrol & glucophage  NEUROLOGY A: Acute Encephalopathy - Multifactorial w/ metabolic, hypercarbic, & hypoxic. Improved. Sedation on Ventilator Chronic Outpatient Oxycodone & Xanax H/O Diabetic Neuropathy H/O Bipolar Disorder H/O PTSD P: RASS goal: 0- to -1 Fentanyl gtt at 200 mcg/hr & IV prn Pain On versed drip at 6 mg/hr Hold outpt xanax & oxycodone Neurontin 300mg  VT bid  HEENT: A: Sclera Edema/conjunctival chemosis - Improving. Glossitis - Improving. Possible Sinusitis. P: Patanol drops bilaterally Tobramycin eye gtts started 8/9 Continue Zantac Keep HOB elevated  Critical care time spent on this pt was 35 minutes.  Family :  I spoke with pt's mother Kenneth Mcfarland and told her over all condition and prognosis. I mentioned about the need for trache to facilitate weaning.  She will think about trache in the next 24 hrs. Once she is OK with it, will consult ENT.    Pollie MeyerJ. Angelo A de Dios, MD 11/29/2015, 10:02 AM [facility] Pager [phone] After 3 pm or if no answer, call [phone]   Kenneth BrimBrandi Ollis, NP-C [facility] Pgr: [phone] or if no answer [phone] 11/24/2015, 8:35 AM

## 2015-12-25 NOTE — ED Provider Notes (Signed)
  I was called to the ICU under a code blue call. Patient had gone into PEA. Had had EPI with no return of vitals. He has been intubated for 2 weeks. I was informed labs have been good earlier in the day. Chest x-ray reportedly under now earlier did not show pneumothorax. Equal breath sounds bilaterally. Continued to run the code. Critical care PA Ollis later arrived at bedside. Found that patient had been hyperkalemic early in the day. Patient was given bicarbonate and calcium at this time. Also been given glucose. Had return of a rhythm on the monitor. Had questionable bradycardia earlier without pulse. Never returned to a pulse and on bedside ultrasound did not show any cardiac activity. Code was ended after resuscitation efforts were deemed futile.   Cardiopulmonary Resuscitation (CPR) Procedure Note Directed/Performed by: Billee CashingPICKERING,Ellene Bloodsaw R. I personally directed ancillary staff and/or performed CPR in an effort to regain return of spontaneous circulation and to maintain cardiac, neuro and systemic perfusion.    Benjiman CoreNathan Charonda Hefter, MD 12/03/2015 [phone]

## 2015-12-25 NOTE — Progress Notes (Signed)
Critical care MD and [name] NP have been updated about heart rate ,b/p and fever today .received   500CC NS X 3 doses.At 1425 code blue called. Heart 66 t0 30 with wideQRS. No pulse noted. Code blue called.

## 2015-12-25 NOTE — Progress Notes (Signed)
Pharmacy Antibiotic Note  Kenneth Mcfarland is a 35 y.o. male with diabetes admitted on August 05, 2015 with cellulitis after recently cuttling left leg. Pharmacy has been consulted for vancomycin and cefepime dosing for HAP. Received a 10-day course of antibiotics of several antibiotics (including 7-days of vancomycin). Patient has remained febrile but fevers much worse overnight.  Previously on cefepime but developed eye swelling and tongue swelling toward end of cefepime course, swelling on unknown etiology but concerned may be related to antibitoics (ie. Cefepime) with history of childhood PCN allergy - unknown reaction.   Plan:  Continue Aztreonam 2gm IV q8h for concern of pseudomonas infection (per d/w CCM)  No gram positive coverage thought to be needed at this time per CCM   Follow cultures  What is plan for continued antibiotics with continued fevers at this point?  Height: 5\' 9"  (175.3 cm) Weight: 288 lb 12.8 oz (131 kg) IBW/kg (Calculated) : 70.7  Temp (24hrs), Avg:101.4 F (38.6 C), Min:98.9 F (37.2 C), Max:102.6 F (39.2 C)   Recent Labs Lab 12/01/15 0250  12/03/15 0323 12/04/15 0320 12/05/15 0406 12/06/15 0346 12/09/2015 0331  WBC  --   < > 10.7* 12.3* 9.5 11.7* 13.0*  CREATININE  --   < > 1.14 1.22 1.02 1.08 1.12  LATICACIDVEN 0.9  --   --   --   --   --   --   < > = values in this interval not displayed.  Estimated Creatinine Clearance: 122.3 mL/min (by C-G formula based on SCr of 1.12 mg/dL).    Allergies  Allergen Reactions  . Bee Venom Anaphylaxis  . Contrast Media [Iodinated Diagnostic Agents] Nausea And Vomiting       . Wasp Venom Anaphylaxis  . Cherry Other (See Comments)    unknown  . Penicillins Other (See Comments)    Childhood allergic reaction - no other information available  . Vicodin [Hydrocodone-Acetaminophen] Diarrhea   Antimicrobials this admission: 7/30 Flagyl >> 7/31 7/30 Aztreonam >> 7/31 7/31 Rocephin >> 8/1 7/30 Vanc >>  8/4, 8/5 >>  8/7 8/1 Cefepime  >> 8/7 8/11 aztreonam >>  Dose adjustments/Drug levels this admission: 8/1 VT = 8 on 1250mg  q12 - increase 1250mg  q8h 8/2 SCr increased - hold vanc, check trough at 1722 (~16hr post dose) = 8 (repeat scr down 1.12) --> change to 1gm q8h 8/4 VT = 14 on 1g q8h, and drawn early(5:45 after end of last Vanc infusion). Increase to 1250mg  q8h.   Microbiology results: 7/30 BCx: ngf 7/30 UCx: multiple, suggest recollect 7/30 MRSA PCR: negative 8/4 trach aspirate: few yeast 8/4 blood: NGTD 8/5 trach aspirate: few yeast 8/10 BCx: pending 8/10 UCx: pending  Thank you for allowing pharmacy to be a part of this patient's care.  Hessie KnowsJustin M Kahlia Lagunes, PharmD, BCPS Pager [phone] 12/08/2015 8:43 AM;

## 2015-12-25 NOTE — Progress Notes (Signed)
Nutrition Follow-up  DOCUMENTATION CODES:   Morbid obesity  INTERVENTION:  - Continue Vital High Protein @ 65 mL/hr with 30 mL Prostat once/day.  - Continue PEPUP protocol. - Continue free water flush per MD/NP order. - RD will follow-up 8/16.  NUTRITION DIAGNOSIS:   Inadequate oral intake related to inability to eat as evidenced by NPO status. -ongoing  GOAL:   Provide needs based on ASPEN/SCCM guidelines -met with current regimen.  MONITOR:   Vent status, TF tolerance, Weight trends, Labs, Skin, I & O's  ASSESSMENT:   This is a 35 year old gentleman with multiple medical issues including diabetes. He states he woke up after midnight tonight and had pain in his left lower extremity. He came to the ER. He denies any recent trauma, stating the scarring on the left lower extremity are old scars.  8/14 Pt with OGT and currently receiving TF at goal rate: Vital High Protein @ 65 mL/hr with 30 mL Prostat once/day which is providing 1660 kcal, 151 grams of protein, and 1304 mL free water. RN in the room reports no issues with TF this shift. Free water flush ordered per MD: 200 mL every 6 hours (800 mL) which is not infusing at this time. Weight has had some fluctuation since RD assessment 8/11; will continue to use weight from 8/11 (131 kg) to calculate estimated needs. Pt has not had a BM since 8/9.  Patient is currently intubated on ventilator support and Dr. Juanetta Gosling note 8/13 states possible need for trach placement. MV: 14.2 L/min Temp (24hrs), Avg:101.4 F (38.6 C), Min:98.9 F (37.2 C), Max:102.6 F (39.2 C)  Medications reviewed; 40 mg IV Lasix QID, sliding scale Novolog, 100 mg Colace BID PRN, 5 mL Senokot BID, PRN Zofran.  Labs reviewed; CBGs: 170 and 186 mg/dL, Na: 146 mg/dL, BUN: 43 mg/dL.  Drips: Versed @ 6 mg/hr, Fentanyl @ 200 mcg/hr.    8/11 - Pt currently receiving Vital High Protein @ 65 mL/hr with 30 mL Prostat once/day which is providing 1660 kcal, 151 grams  of protein, and 1304 mL free water.  - Estimated nutrition needs updated as pt has lost 3.6 kcal since previous assessment.  - Free water flush of 200 mL every 6 hours (800 mL/day) ordered by MD but was not infusing at time of RD visit this AM.   Patient is currently intubated on ventilator support MV: 9.4 L/min Temp (24hrs), Avg:102 F (38.9 C), Min:101.1 F (38.4 C), Max:103.6 F (39.8 C) Drips: Fentanyl @ 175 mcg/hr, Versed @ 2 mg/hr.    8/9 - Currently receiving TF at goal rate: Vital High Protein with 60 mL Prostat BID.  - This regimen is providing 1360 kcal (95% minimum estimated kcal needs), 144 grams of protein, and 803 mL free water.  - Per chart review, weight down 17.3 kg since 7/31 and kcal needs re-estimated per ASPEN/SCCM guideline with current weight.  - Pt given stool softeners and did have a BM yesterday which was the first since admission.  Patient is currently intubated on ventilator support MV: 9.2L/min Temp (24hrs), Avg:101.2 F (38.4 C), Min:98.6 F (37 C), Max:101.8 F (38.8 C) Propofol: off since previous assessment. Drips:Fentanyl @ 150 mcg/hr, Versed @ 7.5 mg/hr.    Diet Order:  Diet NPO time specified  Skin:  Reviewed, no issues (L leg cellulitis, MSAD to buttocks and groin)  Last BM:  8/9  Height:   Ht Readings from Last 1 Encounters:  11/29/15 '5\' 9"'  (1.753 m)    Weight:  Wt Readings from Last 1 Encounters:  12/29/2015 288 lb 12.8 oz (131 kg)    Ideal Body Weight:  72.72 kg  BMI:  Body mass index is 42.65 kg/m.  Estimated Nutritional Needs:   Kcal:  5102-5852 (11-14 kcal/kg actual weight)  Protein:  145-160 grams (2-2.2 grams/kg IBW)  Fluid:  >/= 1.6L  EDUCATION NEEDS:   No education needs identified at this time    Jarome Matin, MS, RD, LDN Inpatient Clinical Dietitian Pager # [phone] After hours/weekend pager # [phone]

## 2015-12-25 NOTE — Progress Notes (Signed)
1430- called pt's mother: Amador CunasCharlene Fano, notified her that the pt's condition had changed and he "had taken a turn for the worse" We discussed if she was able to come to the hospital and she was unsure, but could be reached by phone. At 1447 called pt's mother again to let her know her sister, Ralene BatheMarian Govian was at the hospital and would it be ok for us to speak to her about the pt's condition, the mother gave consent for us to communicate with Ralene BatheMarian Govian. At 1630 notified the pt's mother of the numbers [facility] and later provide the families selection of funeral arrangements. [employer] offered.

## 2015-12-25 NOTE — Accreditation Note (Signed)
o Restraints reported to CMS  Pursuant to regulation 482.13 (G) (3) use of restraints was logged and CMS was notified via email on 08.15.2017 at 0710 by Rosine DoorAngus Verdie Wilms, RN, Patient Safety and Accreditation.

## 2015-12-25 NOTE — Progress Notes (Signed)
Dr deDios and [name] NP aware of heartrate 150 ST and temperature 101.5 to 102.5 .Advil and tylenol given as ordered.Ice packs applied.

## 2015-12-25 NOTE — Progress Notes (Signed)
Called to assess pt for desatting. Upon arrival, sats in the 3380s. Increased peep to 10 on the vent with no success. Pt bagged with 100% FiO2 and a peep valve set at 14 and suctioned for moderate amount of secretions. Sats increased to the low 90s. Pt placed back on the vent and peep increased to 16.Chest xray pending.  Dr Arsenio LoaderSommer made aware. [facility], RRT.

## 2015-12-25 NOTE — Discharge Summary (Signed)
PULMONARY / CRITICAL CARE MEDICINE   Name: Wilbor Schmiesing MRN: [id number] DOB: [birth year]    ADMISSION DATE:  12/11/15 CONSULTATION DATE:  11/24/2015  REFERRING MD:  Dr. Robb Matar, [facility]  CHIEF COMPLAINT:  Short of breath  HISTORY OF PRESENT ILLNESS:   35 yo male presented to ER with Lt leg pain.  He had fever, nausea and vomiting, and diarrhea for several days prior to admission.  He was started on antibiotics for Lt leg cellulitis.  He was noted to progressive hypoxia, and started on supplemental oxygen.  He does not normally use oxygen at home.  He was also started on CPAP >> he did not want to use CPAP because he said he was not used to having so much air blow into his face.  He became more somnolent, and was then started on BiPAP.  He had doppler of left leg >> negative, but limited study due to body habitus.  He has history of severe obstructive sleep apnea, and has been followed by Dr. Vickey Huger with [facility] for sleep apnea.  He was on CPAP, but reportedly this broke 2 weeks prior to admission.  I am not certain he has been compliant with CPAP otherwise.  He carries a diagnosis of narcolepsy, but it is unclear how this diagnosis was made.  He had ABG from which showed pH 7.35, PCO2 55.8, PO2 74.  His ABG from this admission on December 11, 2015 showed pH 7.31, PCO2 61.4, PO2 281 on supplemental oxygen.  IgE from 05/15/15 was 621.  He has an albuterol inhaler.  He is not aware of diagnosis of asthma, or COPD.  He smoked 1/2 pack per day, and reports he last smoked 1 month prior to admission.  He has a hx of THC.  He denies hx of pneumonia or exposure to tuberculosis.  He has cough with chest congestion.  He denies chest or abdominal pain.  He still has intermittent fever.  Leg pain is better, but has more swelling.  PAST MEDICAL HISTORY :  He  has a past medical history of Asthma; Bipolar disorder (HCC); Diabetes mellitus without complication (HCC); Hypertension; Narcolepsy; PTSD (post-traumatic  stress disorder); Sleep apnea; Sleep apnea; Sleep apnea; and Swelling of eye, left (04/2015).  PAST SURGICAL HISTORY: He  has a past surgical history that includes Tonsillectomy.  Allergies  Allergen Reactions  . Bee Venom Anaphylaxis  . Contrast Media [Iodinated Diagnostic Agents] Nausea And Vomiting       . Wasp Venom Anaphylaxis  . Cherry Other (See Comments)    unknown  . Penicillins Other (See Comments)    Childhood allergic reaction - no other information available  . Vicodin [Hydrocodone-Acetaminophen] Diarrhea    No current facility-administered medications on file prior to encounter.    Current Outpatient Prescriptions on File Prior to Encounter  Medication Sig  . ALPRAZolam (XANAX) 1 MG tablet Take 1 mg by mouth 3 (three) times daily as needed for anxiety.   . dicyclomine (BENTYL) 20 MG tablet Take 1 tablet (20 mg total) by mouth 2 (two) times daily as needed (for abdominal pain).  . furosemide (LASIX) 20 MG tablet Take 1 tablet (20 mg total) by mouth 2 (two) times daily. (Patient taking differently: Take 20 mg by mouth daily. )  . gabapentin (NEURONTIN) 300 MG capsule Take 1 capsule (300 mg total) by mouth 2 (two) times daily. Take 1 at bedtime for 1 week, then increase to twice a day. (Patient taking differently: Take 300 mg by mouth 2 (  two) times daily. )  . metFORMIN (GLUCOPHAGE) 500 MG tablet TAKE 1 TABLET BY MOUTH TWICE A DAY WITH MEALS (Patient taking differently: Take 500 mg by mouth 2 (two) times daily with a meal. )  . ondansetron (ZOFRAN ODT) 4 MG disintegrating tablet Take 1 tablet (4 mg total) by mouth every 8 (eight) hours as needed for nausea or vomiting.  Marland Kitchen oxyCODONE (OXY IR/ROXICODONE) 5 MG immediate release tablet Take 1 tablet (5 mg total) by mouth every 6 (six) hours as needed (pain). (Patient taking differently: Take 10 mg by mouth 2 (two) times daily as needed (pain). )  . pantoprazole (PROTONIX) 40 MG tablet Take 1 tablet (40 mg total) by mouth daily.   . pirbuterol (MAXAIR) 200 MCG/INH inhaler Inhale 2 puffs into the lungs every 6 (six) hours as needed for wheezing.    FAMILY HISTORY:  His is adopted.    SOCIAL HISTORY: He  reports that he has been smoking Cigarettes.  He has a 3.75 pack-year smoking history. He uses smokeless tobacco. He reports that he drinks alcohol. He reports that he uses drugs, including Marijuana, about 5 times per week.  REVIEW OF SYSTEMS:   Negative except above.  SUBJECTIVE:  C/o cough.  VITAL SIGNS: BP (!) 57/35   Pulse (!) 150   Temp (!) 102.8 F (39.3 C) (Oral)   Resp (!) 26   Ht 5\' 9"  (1.753 m)   Wt 131 kg (288 lb 12.8 oz)   SpO2 99%   BMI 42.65 kg/m   HEMODYNAMICS:    VENTILATOR SETTINGS: Vent Mode: PRVC FiO2 (%):  [50 %-100 %] 100 % Set Rate:  [16 bmp] 16 bmp Vt Set:  [570 mL] 570 mL PEEP:  [5 cmH20-16 cmH20] 10 cmH20 Plateau Pressure:  [21 cmH20-31 cmH20] 26 cmH20  INTAKE / OUTPUT: I/O last 3 completed shifts: In: 3505.7 [I.V.:1000.7; Other:200; NG/GT:2055; IV Piggyback:250] Out: 3625 [Urine:3625]  PHYSICAL EXAMINATION: General: somnolent Neuro:  Wakes up and follows commands, moves extremities HEENT:  Sclera injected, MP 4, no stridor Cardiovascular:  Regular, tachycardic Lungs:  B/l crackles, no wheeze Abdomen:  Obese, soft, non tender Musculoskeletal:  3+ edema Skin:  Excoriation Lt leg  LABS:  BMET  Recent Labs Lab 12/06/15 0346 Jan 05, 2016 0331 2016-01-05 1321  NA 148* 146* 147*  K 3.8 4.5 5.9*  CL 109 109 107  CO2 32 30 29  BUN 39* 43* 61*  CREATININE 1.08 1.12 2.37*  GLUCOSE 140* 129* 171*    Electrolytes  Recent Labs Lab 12/04/15 0320 12/05/15 0406 12/06/15 0346 Jan 05, 2016 0331 January 05, 2016 1321  CALCIUM 9.3 9.5 9.2 9.1 9.3  MG 1.9 1.9 1.8  --   --   PHOS 4.6 4.6 5.2*  --   --     CBC  Recent Labs Lab 12/06/15 0346 2016-01-05 0331 January 05, 2016 1321  WBC 11.7* 13.0* 11.8*  HGB 12.7* 13.1 13.7  HCT 43.6 44.8 46.8  PLT 212 201 171     Coag's  Recent Labs Lab 01-05-16 1321  APTT 32  INR 1.35    Sepsis Markers  Recent Labs Lab 12/01/15 0250 12/02/15 0334 12/03/15 0323 2016/01/05 1321  LATICACIDVEN 0.9  --   --  2.4*  PROCALCITON 0.51 0.31 0.21  --     ABG  Recent Labs Lab 12/01/15 0402  PHART 7.383  PCO2ART 65.4*  PO2ART 84.3    Liver Enzymes  Recent Labs Lab 12/01/15 0300  12/04/15 0320 12/05/15 0406 12/06/15 0346  AST 73*  --   --   --   --  ALT 45  --   --   --   --   ALKPHOS 81  --   --   --   --   BILITOT 0.4  --   --   --   --   ALBUMIN 2.6*  < > 2.3* 2.5* 2.5*  < > = values in this interval not displayed.  Cardiac Enzymes No results for input(s): TROPONINI, PROBNP in the last 168 hours.  Glucose  Recent Labs Lab 12/06/15 1614 12/06/15 1935 12/06/15 2325 12/04/2015 0503 12/16/2015 0738 12/12/2015 1154  GLUCAP 132* 134* 123* 170* 186* 205*    Imaging Dg Chest Port 1 View  Result Date: 12/21/2015 CLINICAL DATA:  Fever, walking corpse syndrome EXAM: PORTABLE CHEST 1 VIEW COMPARISON:  12/24/2015 FINDINGS: Cardiac shadow is stable. Endotracheal tube and nasogastric catheter are noted in satisfactory position. The previously seen changes in the bases are stable. No new focal infiltrate or effusion is seen. No pneumothorax is noted. IMPRESSION: No evidence of pneumothorax. Tubes and lines as described above. Bibasilar infiltrate/atelectasis similar to that seen on prior exam. Electronically Signed   By: Alcide CleverMark  Lukens M.D.   On: 12/14/2015 13:53   Dg Chest Port 1 View  Result Date: 12/13/2015 CLINICAL DATA:  35 year old male with a history of decreasing oxygen saturation EXAM: PORTABLE CHEST 1 VIEW COMPARISON:  12/02/2015, 12/06/2015 FINDINGS: Cardiomediastinal silhouette unchanged. Unchanged endotracheal tube terminating approximately 5 cm-6 cm above the carina. Gastric tube projects over the mediastinum, unchanged. Similar appearance of basilar predominant mixed interstitial and  airspace disease, extending from the hilar regions peripherally. No pneumothorax. Blunting of left costophrenic angle and opacification the retrocardiac region. IMPRESSION: Similar appearance of the prior chest x-ray with persistent mixed interstitial and airspace disease, potentially combination of atelectasis, consolidation, and/ or edema. Small pleural effusions not excluded. Unchanged endotracheal tube and gastric tube. Signed, Yvone NeuJaime S. Loreta AveWagner, DO [facility] Electronically Signed   By: Gilmer MorJaime  Wagner D.O.   On: 11/27/2015 07:31   Dg Chest Port 1 View  Result Date: 12/18/2015 CLINICAL DATA:  Respiratory failure. EXAM: PORTABLE CHEST 1 VIEW COMPARISON:  12/06/2015.  Eighteen 2017.  11/27/2015.  01/25/2015. FINDINGS: Endotracheal tube and NG tube in stable position. Stable cardiomegaly. Rounded fullness noted projected over the left hilar region. This may be related to prominent pulmonary pulmonary artery. Follow-up chest x-rays suggested for further evaluation, PA and lateral chest x-ray suggested for further evaluation when the patient is clinically capable. Bibasilar atelectasis and/or infiltrates. Small left pleural effusion cannot be excluded. No pneumothorax. IMPRESSION: 1.  Lines and tubes in stable position. 2. Stable cardiomegaly. Rounded fullness noted projected over the left hilum. Follow-up chest x-rays suggested for further evaluation. PA and lateral chest x-ray is suggested for further evaluation when the patient is clinically capable. 3. Low lung volumes with progressive bibasilar atelectasis and/or infiltrates. Electronically Signed   By: Maisie Fushomas  Register   On: 11/25/2015 06:37   Dg Chest Port 1 View  Result Date: 12/06/2015 CLINICAL DATA:  Followup for respiratory failure. EXAM: PORTABLE CHEST 1 VIEW COMPARISON:  12/03/2015 FINDINGS: There has been improvement with near complete clearing of the left lung. Mild hazy opacity right  lung has improved. The hemidiaphragms are now visible. No new lung abnormalities.  No pneumothorax. Endotracheal tube and nasogastric tube are stable in well positioned. IMPRESSION: 1. Improved lung aeration since the prior exam. No new abnormalities. 2. Support apparatus well positioned. Electronically Signed   By: Renard Hamperavid  Ormond M.D.  On: 12/06/2015 07:29     STUDIES:  PSG 03/03/15 >> AHI 108, SpO2 low 60%.  CPAP 16 >> AHI 0 Doppler Lt leg 11/23/15 >> negative for DVT, limited study Echo 11/24/15 >>   CULTURES: 7/30 Blood >>  8/01 Sputum >>  ANTIBIOTICS: 7/29 Aztreonam >> 7/30 7/29 Flagyl >> 7/30 7/29 Vancomycin >> 7/31 Rocephin >> 8/01 8/01 Cefepime >>   SIGNIFICANT EVENTS: 7/29 Admit  LINES/TUBES:  DISCUSSION: 35 yo male admitted with sepsis from Lt leg cellulitis.  Has hx of tobacco abuse and severe obstructive sleep apnea.  He has been non compliant with CPAP as outpt.  He has previous ABG showing hypoxia/hypercapnia, and this is most likely related to obesity hypoventilation syndrome.  He has previously elevated IgE.  He has progressive respiratory distress with hypoxia, productive cough, and edema.  CXR shows changes of PNA.  ASSESSMENT / PLAN:  Acute on chronic hypoxic, hypercapnic respiratory failure from HCAP, OSA/OHS, possible obstructive airways disease, presumed diastolic CHF. - oxygen to keep SpO2 90 to 95% - suspicion for thromboembolic disease is low >> defer V/Q scan for now  Sepsis with HCAP and Lt leg cellulitis. - Change Abx to vancomycin and cefepime - f/u sputum cx, procalcitonin  OSA/OHS. - BiPAP qhs and prn during the day >> compliance with therapy will be an issue - weight loss  Hx of tobacco abuse with possible obstructive lung disease. - scheduled BDs - bronchial hygiene - smoking cessation - will need PFTs as outpt  Presumed diastolic CHF with volume overload. - KVO IV fluids - Lasix 40 mg IV x one 8/01 - f/u Echo  Reported hx of  narcolepsy. - I suspect he sleep issues are related to sleep disordered breathing rather than narcolepsy >> would not treat with stimulant medications until he is compliant with therapy for OSA/OHS  DM with neuropathy. - per primary team  CC time 43 minutes.  Coralyn Helling, MD [place] [facility]    Addendum : pt had a very complicated ICU course.  Pls see my last crit care note prior to pt's arrest. See below:   PULMONARY / CRITICAL CARE MEDICINE   Name: Paco Cislo MRN: [id number] DOB: [birth year]    ADMISSION DATE:  2015-11-30 CONSULTATION DATE:  11/24/2015  REFERRING MD:  Dr. Robb Matar, [facility]  CHIEF COMPLAINT:  Short of breath  SUBJECTIVE:   Episode of desaturation early am with bath to the 80's, PEEP increased to 16 and FiO2 increased to 100%. Pt was ambubagged. o2 sats improved. CXR with no acute findings. (-) PTX seen.   Tmax 102.6.  Net negative 600 from lasix  VITAL SIGNS: BP (!) 57/35   Pulse (!) 150   Temp (!) 102.8 F (39.3 C) (Oral)   Resp (!) 26   Ht 5\' 9"  (1.753 m)   Wt 131 kg (288 lb 12.8 oz)   SpO2 99%   BMI 42.65 kg/m   VENTILATOR SETTINGS: Vent Mode: PRVC FiO2 (%):  [50 %-100 %] 100 % Set Rate:  [16 bmp] 16 bmp Vt Set:  [570 mL] 570 mL PEEP:  [5 cmH20-16 cmH20] 10 cmH20 Plateau Pressure:  [21 cmH20-31 cmH20] 26 cmH20  INTAKE / OUTPUT:  Intake/Output Summary (Last 24 hours) at 12/23/2015 1524 Last data filed at 12/24/2015 1200  Gross per 24 hour  Intake             1780 ml  Output             1630 ml  Net              150 ml     PHYSICAL EXAMINATION: General: ill appearing obese male Neuro: follows simple commands. Sedated.  HEENT: scleral edema Lt > Rt Cardiac: regular, tachy Chest: non-labored, lungs bilaterally coarse Abd: obese, soft Ext: 1+ edema Skin: no rashes  LABS:  BMET  Recent Labs Lab 12/06/15 0346 01-03-16 0331 01/03/2016 1321  NA 148* 146* 147*  K 3.8 4.5 5.9*  CL 109 109 107  CO2 32 30 29  BUN 39*  43* 61*  CREATININE 1.08 1.12 2.37*  GLUCOSE 140* 129* 171*    Electrolytes  Recent Labs Lab 12/04/15 0320 12/05/15 0406 12/06/15 0346 01/03/16 0331 01/03/2016 1321  CALCIUM 9.3 9.5 9.2 9.1 9.3  MG 1.9 1.9 1.8  --   --   PHOS 4.6 4.6 5.2*  --   --     CBC  Recent Labs Lab 12/06/15 0346 01-03-2016 0331 Jan 03, 2016 1321  WBC 11.7* 13.0* 11.8*  HGB 12.7* 13.1 13.7  HCT 43.6 44.8 46.8  PLT 212 201 171    Coag's  Recent Labs Lab 03-Jan-2016 1321  APTT 32  INR 1.35    Sepsis Markers  Recent Labs Lab 12/01/15 0250 12/02/15 0334 12/03/15 0323 2016/01/03 1321  LATICACIDVEN 0.9  --   --  2.4*  PROCALCITON 0.51 0.31 0.21  --     ABG  Recent Labs Lab 12/01/15 0402  PHART 7.383  PCO2ART 65.4*  PO2ART 84.3    Liver Enzymes  Recent Labs Lab 12/01/15 0300  12/04/15 0320 12/05/15 0406 12/06/15 0346  AST 73*  --   --   --   --   ALT 45  --   --   --   --   ALKPHOS 81  --   --   --   --   BILITOT 0.4  --   --   --   --   ALBUMIN 2.6*  < > 2.3* 2.5* 2.5*  < > = values in this interval not displayed.  Cardiac Enzymes No results for input(s): TROPONINI, PROBNP in the last 168 hours.  Glucose  Recent Labs Lab 12/06/15 1614 12/06/15 1935 12/06/15 2325 01-03-2016 0503 01/03/2016 0738 2016-01-03 1154  GLUCAP 132* 134* 123* 170* 186* 205*    Imaging Dg Chest Port 1 View  Result Date: 01-03-2016 CLINICAL DATA:  Fever, walking corpse syndrome EXAM: PORTABLE CHEST 1 VIEW COMPARISON:  2016-01-03 FINDINGS: Cardiac shadow is stable. Endotracheal tube and nasogastric catheter are noted in satisfactory position. The previously seen changes in the bases are stable. No new focal infiltrate or effusion is seen. No pneumothorax is noted. IMPRESSION: No evidence of pneumothorax. Tubes and lines as described above. Bibasilar infiltrate/atelectasis similar to that seen on prior exam. Electronically Signed   By: Alcide Clever M.D.   On: 01/03/16 13:53   Dg Chest Port 1  View  Result Date: 01-03-2016 CLINICAL DATA:  35 year old male with a history of decreasing oxygen saturation EXAM: PORTABLE CHEST 1 VIEW COMPARISON:  January 03, 2016, 12/06/2015 FINDINGS: Cardiomediastinal silhouette unchanged. Unchanged endotracheal tube terminating approximately 5 cm-6 cm above the carina. Gastric tube projects over the mediastinum, unchanged. Similar appearance of basilar predominant mixed interstitial and airspace disease, extending from the hilar regions peripherally. No pneumothorax. Blunting of left costophrenic angle and opacification the retrocardiac region. IMPRESSION: Similar appearance of the prior chest x-ray with persistent mixed interstitial and airspace disease, potentially combination of atelectasis, consolidation, and/ or edema. Small pleural  effusions not excluded. Unchanged endotracheal tube and gastric tube. Signed, Yvone Neu. Loreta Ave, DO [facility] Electronically Signed   By: Gilmer Mor D.O.   On: 12/21/2015 07:31   Dg Chest Port 1 View  Result Date: 12/10/2015 CLINICAL DATA:  Respiratory failure. EXAM: PORTABLE CHEST 1 VIEW COMPARISON:  12/06/2015.  Eighteen 2017.  11/27/2015.  01/25/2015. FINDINGS: Endotracheal tube and NG tube in stable position. Stable cardiomegaly. Rounded fullness noted projected over the left hilar region. This may be related to prominent pulmonary pulmonary artery. Follow-up chest x-rays suggested for further evaluation, PA and lateral chest x-ray suggested for further evaluation when the patient is clinically capable. Bibasilar atelectasis and/or infiltrates. Small left pleural effusion cannot be excluded. No pneumothorax. IMPRESSION: 1.  Lines and tubes in stable position. 2. Stable cardiomegaly. Rounded fullness noted projected over the left hilum. Follow-up chest x-rays suggested for further evaluation. PA and lateral chest x-ray is suggested for further evaluation when the patient is  clinically capable. 3. Low lung volumes with progressive bibasilar atelectasis and/or infiltrates. Electronically Signed   By: Maisie Fus  Register   On: 12/11/2015 06:37   Dg Chest Port 1 View  Result Date: 12/06/2015 CLINICAL DATA:  Followup for respiratory failure. EXAM: PORTABLE CHEST 1 VIEW COMPARISON:  12/03/2015 FINDINGS: There has been improvement with near complete clearing of the left lung. Mild hazy opacity right lung has improved. The hemidiaphragms are now visible. No new lung abnormalities.  No pneumothorax. Endotracheal tube and nasogastric tube are stable in well positioned. IMPRESSION: 1. Improved lung aeration since the prior exam. No new abnormalities. 2. Support apparatus well positioned. Electronically Signed   By: Amie Portland M.D.   On: 12/06/2015 07:29     STUDIES:  PSG 03/03/15 >> AHI 108, SpO2 low 60%.  CPAP 16 >> AHI 0 Doppler Lt leg 11/23/15 >> negative for DVT, limited study TTE 11/24/15 >> mod LVH, EF 65 to 70%, mod RV dilation, mod TR, PAS 85 mmHg  MICROBIOLOGY: Urine Ctx 7/30:  Multiple Species Blood Ctx x2 7/30:  Negative  MRSA PCR 7/30:  Negative  Tracheal Asp Ctx 8/4: Few C albicans  Blood Ctx x2 8/4:  Negative  Tracheal Asp Ctx 8/5: Few C albicans Blood Ctx x2 8/7:  Negative Tracheal Asp Ctx 8/7:  Normal flora Blood Ctx 8/10 >>  Tracheal Asp Ctx 8/10:  Negative  Urine Ctx 8/11:  Negative   ANTIBIOTICS: Aztreonam 7/29 - 7/30 Flagyl 7/29 - 7/30 Rocephin 7/30 - 8/1 Vancomycin 7/29 - 8/7 Cefepime 8/1 - 8/7 Aztreonam 8/11 >>  SIGNIFICANT EVENTS: 7/29  Admit 8/01  VDRF 8/02  Self extubation, reintubated per anesthesia  LINES/TUBES: ETT 8/1 - 8/2 (self extubation); 8/2 (reintubated by anesthesia w/ difficult airway) >>   ASSESSMENT / PLAN:  PULMONARY A: Acute on Chronic Hypoxic Respiratory Failure - Presumed Diastolic CHF, HCAP, OSA/OHS. EXTREMELY DIFFICULTY AIRWAY Possible COPD/Asthma Tobacco Use Disorder P: Pt desaturated this am during  bath > improved with ambubagging. CXR with no new findings. PEEP increased to 16 and 100%Fio2 >> slowly cutting down PEEP this am. Currently at PEEP of 10 and sats are 97%.  Goal for PEEP of 8 and FiO2 40-50% in order to do tracheostomy.  Pt has been intubated 16 days and has a difficult airway and has chronic hypercapnea 2/2 untrated OSA/OHS.  I recommended tracheostomy once on lower PEEP and FiO2 to facilitate weaning from the vent.  Will consult ENT for trache once mother consents to  it.  Daily PST but plan NOT to extubate for now.  Scheduled BDs Nicotine patch  CARDIAC A: Acute Cor Pulmonale/RV Failure. Acute on Chronic Diastolic CHF. Sinus tachycardia.  H/O Hypertension. P: Tachycardia could be 2/2 diuresis. (-) 11L since admission. Edema is better. Has azotemia now.  Will d/c lasix. Cont free water.  Tele monitoring  RENAL A: Azotemia P: Will keep Is and os even.  Will d/c lasix. Cont free water.  Observe lytes.  Replace electrolytes as indicated  GASTROENTEROLOGY A: Constipation - Resolved after SMOG. Morbid obesity  P: Continue Tube Feedings Ranitidine 300mg  VT daily Senna & Colace VT bid  HEMATOLOGY A: Leukocytosis. P: F/u CBC Heparin Pleak q8hr SCDs  INFECTION A: Sepsis - S/P Course of Vancomycin & Cefepime stopped 8/7. Left Leg Cellulitis w/ 2 Blisters - Mild. LLL HCAP. FUO - Drug reaction vs Sinusitis. P: Day 4 aztreonam. Plan for 5days aztreonam then d/c.  Fever could be drug fever as well. Cultures have been (-).   ENDOCRINE A: H/O DM Type 2 - Glucose controlled. P: Accu-Checks q4hr SSI per Resistant Algorithm Holding outpatient glucotrol & glucophage  NEUROLOGY A: Acute Encephalopathy - Multifactorial w/ metabolic, hypercarbic, & hypoxic. Improved. Sedation on Ventilator Chronic Outpatient Oxycodone & Xanax H/O Diabetic Neuropathy H/O Bipolar Disorder H/O PTSD P: RASS goal: 0- to -1 Fentanyl gtt at 200 mcg/hr & IV prn Pain On versed  drip at 6 mg/hr Hold outpt xanax & oxycodone Neurontin 300mg  VT bid  HEENT: A: Sclera Edema/conjunctival chemosis - Improving. Glossitis - Improving. Possible Sinusitis. P: Patanol drops bilaterally Tobramycin eye gtts started 8/9 Continue Zantac Keep HOB elevated  Critical care time spent on this pt was 35 minutes.  Family :  I spoke with pt's mother Westley HummerCharlene and told her over all condition and prognosis. I mentioned about the need for trache to facilitate weaning.  She will think about trache in the next 24 hrs. Once she is OK with it, will consult ENT.    Pollie MeyerJ. Angelo A de Dios, MD 11/25/2015, 3:24 PM [place] [facility] Pager [phone] After 3 pm or if no answer, call [phone]   [name], NP-C  Pulmonary & Critical Care Pgr: [phone] or if no answer [phone] 12/04/2015, 3:24 PM      Addendum: pls see below:   [facility] PCCM  Pt with persistent hypotension.  BP was 90 systolic this am. HR in 150s. Regular. RR 20s. O2 sats 94% on 100 Fio2. T 101 Sedated. Does not follow commands. Comfortable. Not in distress.  (-) subq crepitus noted.  Dec BS on BLF. Crackles at bases. (-) wheezing. (-) rhonchi.  Tachycardic. (-) s3/m/r/g. Distended abd. Soft. Warm extremities. Gr 1 edema. Trace pulse.   We did bedside US of lung to check for PTX as we were waiting on stat CXR.  Sliding lung seen in R ant and L ant chest. With Ultrasound  use, I was able to visualize the IVC and it was not collapsible. Around 2-3 cm and did not vary with inspiration.   Differentials for hypotension : 1. Could be volume issue. He just received 1L bolus of NS. Even w/o IVC variability, I still think he is volume depleted. Will start maintenance IVF at 50 mls/hr > may increase accordingly.  2. Related to sedation and meds. Pt was on versed 6 mg IV drip  and fentanyl drip at 200 mcg/hr. We decreased versed to 2mg /hr and decreased fentanyl to 100 mcg/hr. 3. R/O PTX.  Stat CXR >  still waiting on it.  4. Doubt worsening sepsis. Still on abx.  5. Will get stat CBC, BMP, lactate, PT/INR/PTT.    Pollie Meyer, MD 12/25/15, 3:24 PM [place] [facility] Pager [phone] After 3 pm or if no answer, call [phone]    Addendum : pls see below.   [facility] PCCM  Immediately since my last note, BP improved to 110-120/60-70 systolic. HR in 120s. He eventually got 1.5 L IVF as bolus.  His sedatives were also dereased > versed dec to 2 mg/hr and fentanyl drip to 100 mcg/hr.   Prior to code, per RN, his HR quickly dropped from 120 to 60s to 30s. It was a wide complex rhythym then he went into flatline. Code Blue was called. Pls see separate records.   Labs from this am showed AKI with Creat at 2.37 and K was only 5.9. CBC was stable. Lactate was 2.4. INR was 1.35. Meds were given for hyperK for the code (pls see separate note). Dr. Rubin Payor from ED administered the code together with Canary Brim NP. Massive PE was considered for the cause of arrest and heparin drip +/- tPA were considered. Code was called after 25 minutes. No recovery was seen and he stayed flatline throughout.   I called up pt's mother Westley Hummer and discussed with her events leading to the code.  I also discussed with pt's aunt regarding events leading to the code.   Please provide post mortem care.   Pt was pronounced at 1449. Mother and aunt have been updated.    Pollie Meyer, MD 12/25/15, 3:24 PM [facility] Pager [phone] After 3 pm or if no answer, call [phone]

## 2015-12-25 NOTE — Progress Notes (Signed)
160 cc of Fentanyl drip and 52 cc of versed drip wasted in sink. Witnessed by Sharl MaBri McNabb, RN.

## 2015-12-25 NOTE — Progress Notes (Addendum)
This chaplain assumed care of family from [place] Brittany Varner at shift change.    Providing grief support as needed at bedside with Aunt, Uncle.    Anticipating arrival of pt's mother.     @16 :5443 - pt's mother not able to come to hospital due to health.  Pt's aunt, uncle, s/o have visited pt at bedside.  Chaplain providing emotional support around grief and loss.      Merdis DelayJamaine has distance from family for past 2.5 years.  Aunt describes Merdis DelayJamaine grieving the death of a son.  Son had special needs and died from choking.  Aunt describes Merdis DelayJamaine as being traumatized by death and breaking ties with family.  Aunt describes feeling at a loss regarding [employer] insurance, wishes for disposition of body after death.  Kyran's mother has spoken with nursing and has number to bed control to inform when family have decided about arrangements.   Merdis DelayJamaine has younger brother in school at [employer].     Belva CromeStalnaker, Elani Delph Wayne MDiv    Belva CromeStalnaker, Maliha Outten Wayne MDiv

## 2015-12-25 NOTE — Code Documentation (Cosign Needed)
CODE BLUE   Called at 1425 for Code in Progress.    Dr. Rubin PayorPickering at bedside.  Initial rhythm wide complex bradycardia that progressed to PEA.  ACLS protocol initiated immediately per bedside staff upon recognition.  Pre-arrest labs reviewed - notable for hyperkalemia (K 5.9), increased sr cr (2.37 up from 1.12), lactic acid 2.4, WBC 11.8 and Hgb 13.7.  Prior chest xray (one hour pre-arrest) without pneumothorax.  Multiple rounds of ACLS performed.  Approximately 25 minutes of CPR / ACLS performed without return of spontaneous circulation.  Resuscitation efforts ended at 1449.     Mother notified of patients status per Dr. Christene Slatese Dios.  Aunt / Uncle updated on the unit.  Chaplain support provided.     Canary BrimBrandi Devinne Epstein, NP-C [facility] Pgr: [phone] or if no answer [phone] 11/29/2015, 3:16 PM

## 2015-12-25 NOTE — Progress Notes (Signed)
Called by RN regarding tachycardia and hypotension.  Suspect tachycardia driven by fever with resultant hypotension.  However, was diuresed yesterday.    Plan: 500 ml NS bolus now Monitor BP trend  Control fever > see Dr. Christene Slatese Dios prior orders   Canary BrimBrandi Badr Piedra, NP-C [facility] Pgr: [phone] or if no answer [phone] 2015-07-26, 12:05 PM

## 2015-12-25 DEATH — deceased

## 2017-09-17 IMAGING — MR MR ORBITS WO/W CM
4 of 7 series · 21 of 48 positions shown · IV contrast (multihance)
Comparison: Orbit CT from 3 days ago

CLINICAL DATA: Redness and swelling of the left eye beginning 1
week ago. Severe pain not responding to antibiotics.

EXAM:
MRI OF THE ORBITS WITHOUT AND WITH CONTRAST
TECHNIQUE: Multiplanar, multisequence MR imaging of the orbits was performed
both before and after the administration of intravenous contrast.
CONTRAST:  20mL MULTIHANCE GADOBENATE DIMEGLUMINE 529 MG/ML IV SOLN

[Series 4: T2 fat-sat · coronal · 4.0mm · 0.35mm/px · 6 of 20 slices shown (1 of 2)]
[im 1/20]
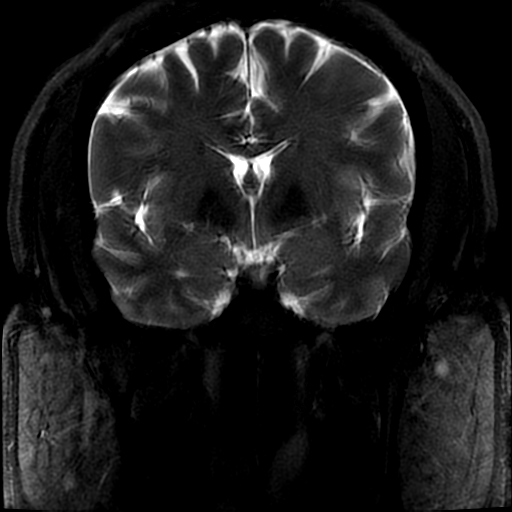
[im 4/20]
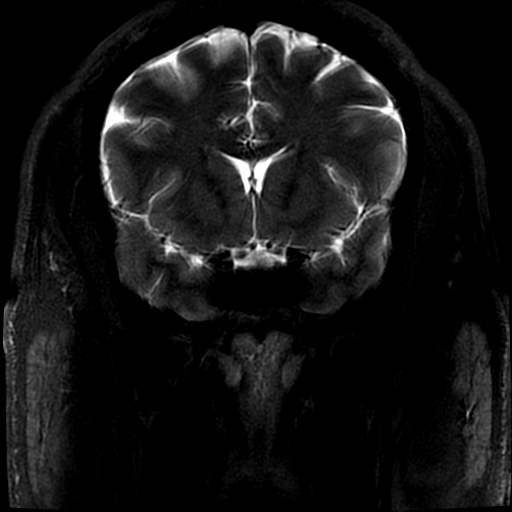
[im 7/20]
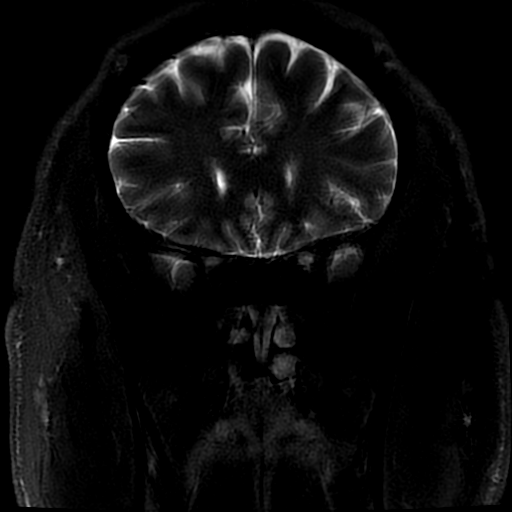
[im 10/20]
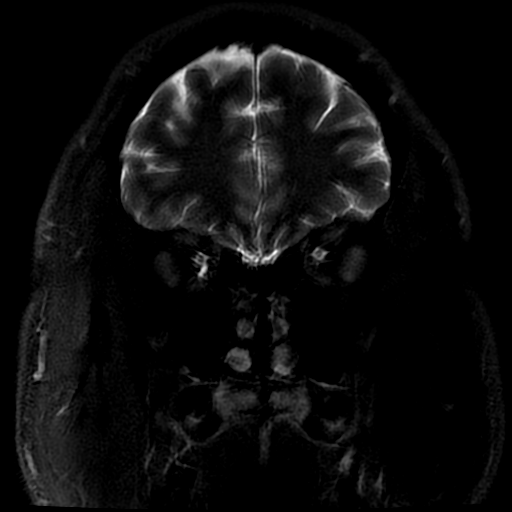
[im 13/20]
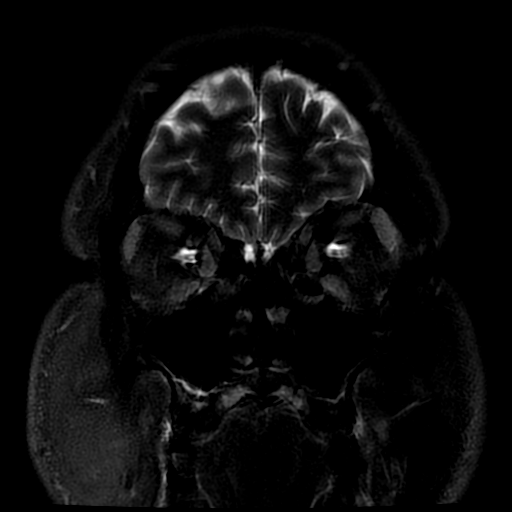
[im 16/20]
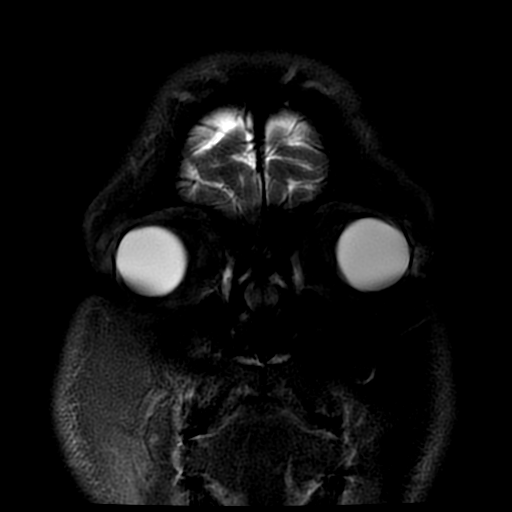

[Series 5: T1 · coronal · 4.0mm · 0.70mm/px · 3 of 21 slices shown]
[im 4/21]
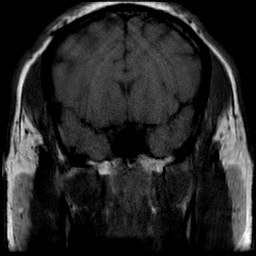
[im 11/21]
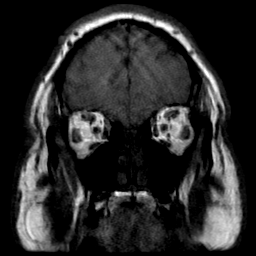
[im 17/21]
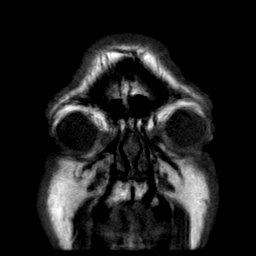

[Series 6: T2 fat-sat · axial · 3.0mm · 0.35mm/px · z∈[-18,+27]mm · 3 of 19 slices shown (2 of 2)]
[im 4/19]
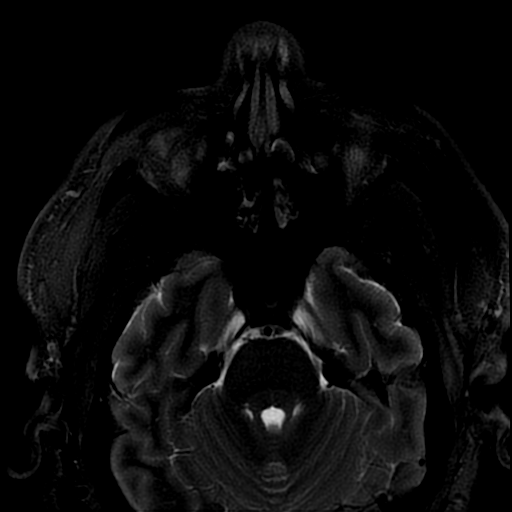
[im 11/19]
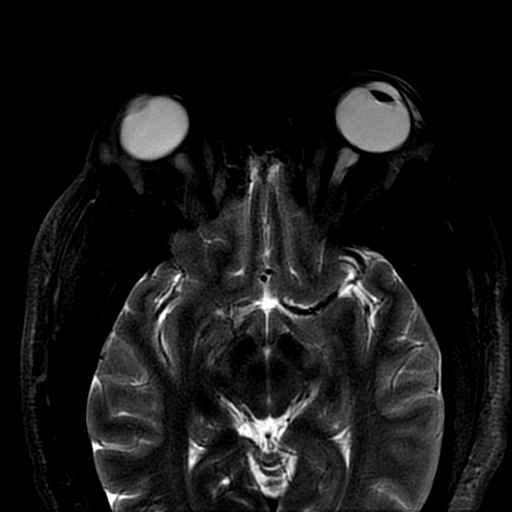
[im 19/19]
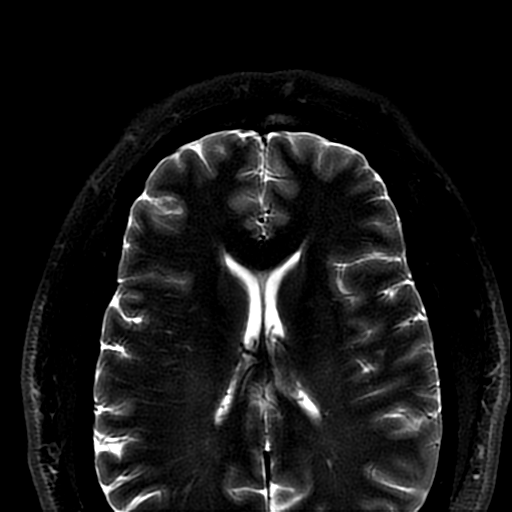

[Series 8: FLAIR · sagittal · 5.0mm · 0.49mm/px · 9 of 26 slices shown]
[im 1/26]
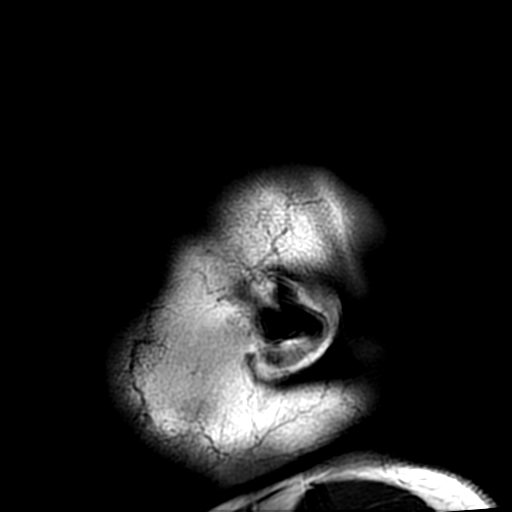
[im 4/26]
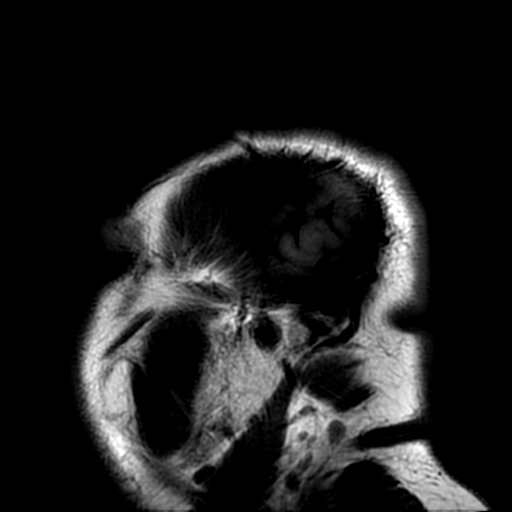
[im 7/26]
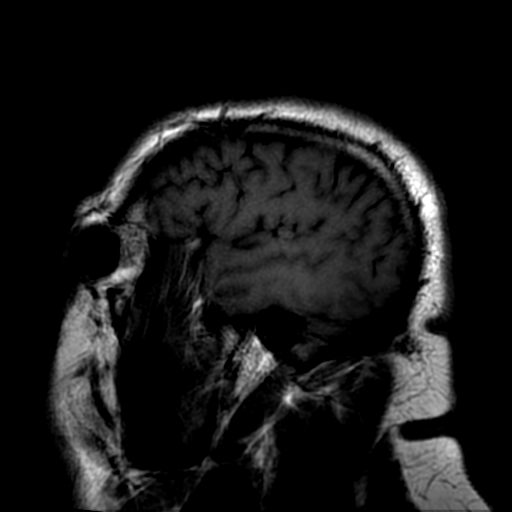
[im 10/26]
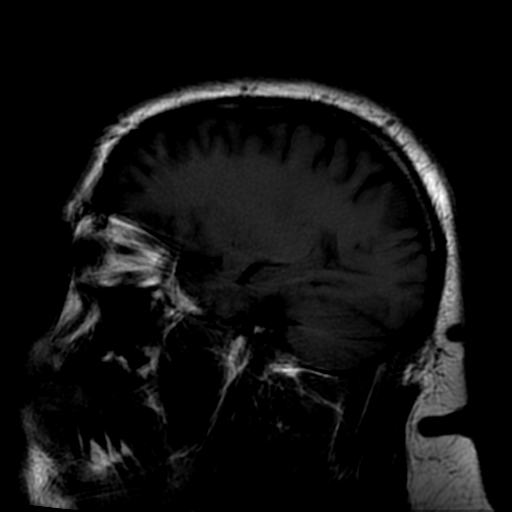
[im 13/26]
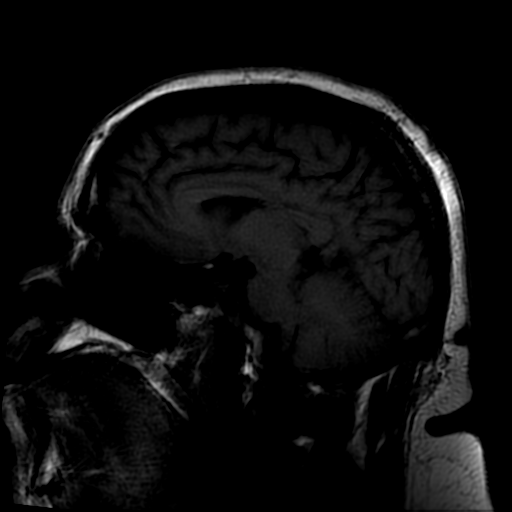
[im 16/26]
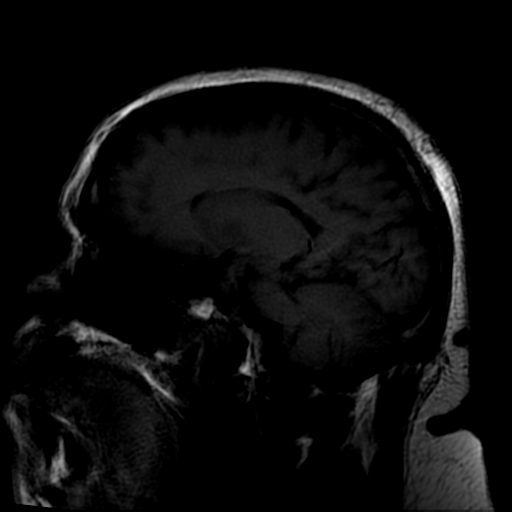
[im 19/26]
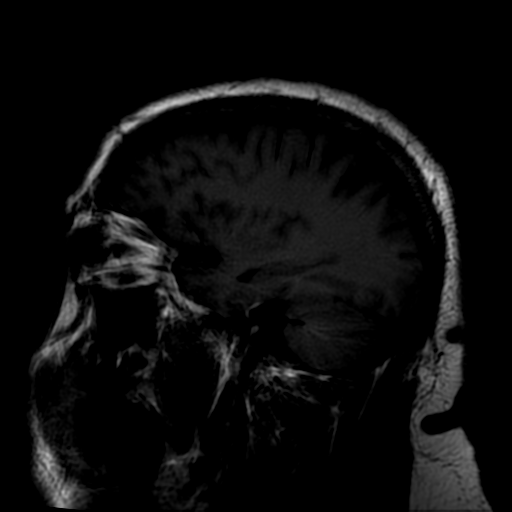
[im 22/26]
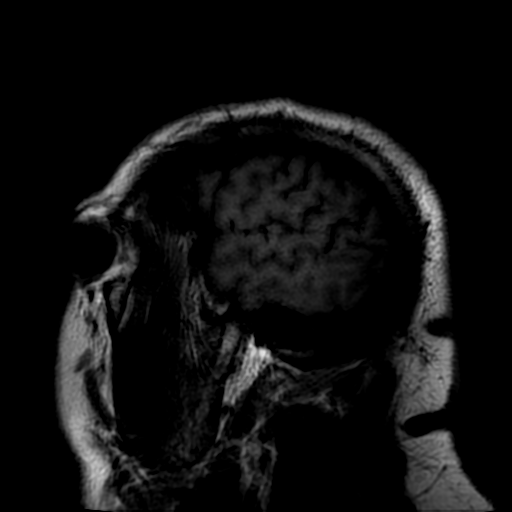
[im 26/26]
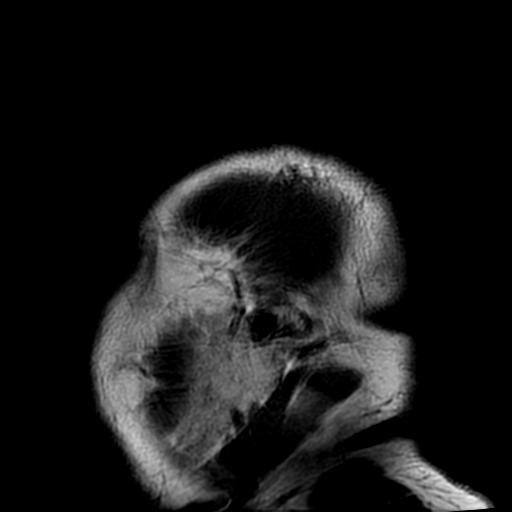

[21 of 48 positions shown; findings below may reference images not displayed]

FINDINGS: There is left lid edema and swelling with asymmetric enhancement of
the left conjunctival sac consistent with preseptal cellulitis. No
postseptal inflammation or edema is contiguous. No inciting
sinusitis. There may have been a lid appendage infection on the
nasal side on prior CT.

Mounding of the optic discs with prominent optic nerve sheath fluid
and mild flattening of the posterior sclera. These findings suggest
elevated pressures and papilledema. Partial intracranial imaging
shows no hydrocephalus or mass effect. Pseudotumor considered given
patient size.

Partially visualized bilateral mastoid effusion or mucosal
thickening.

The extraocular muscles are diffusely thickened, especially compared
to 6007. These also have a wispy T2 hyperintense appearance.
Atypical pattern thyroid orbitopathy or sarcoidosis are the top
considerations. Patient has proptosis on a chronic basis based on
previous CT.
IMPRESSION: 1. Left preseptal cellulitis.
2. Imaging findings suggestive of papilledema; correlate with
pressures and funduscopic exam. Partial intracranial imaging shows
no hydrocephalus or mass effect.
3. Mild diffuse thickening of the extraocular muscles compared to
6007 head CT. Thyroid orbitopathy or sarcoidosis are top
considerations. Proptosis is stable from 6007.
4. Bilateral mastoid effusion.

## 2017-09-19 IMAGING — DX DG CHEST 2V
2 series · 2 of 2 positions shown · non-contrast
Comparison: 01/25/2015

CLINICAL DATA: Shortness of breath.

EXAM:
CHEST  2 VIEW

[chest pa]
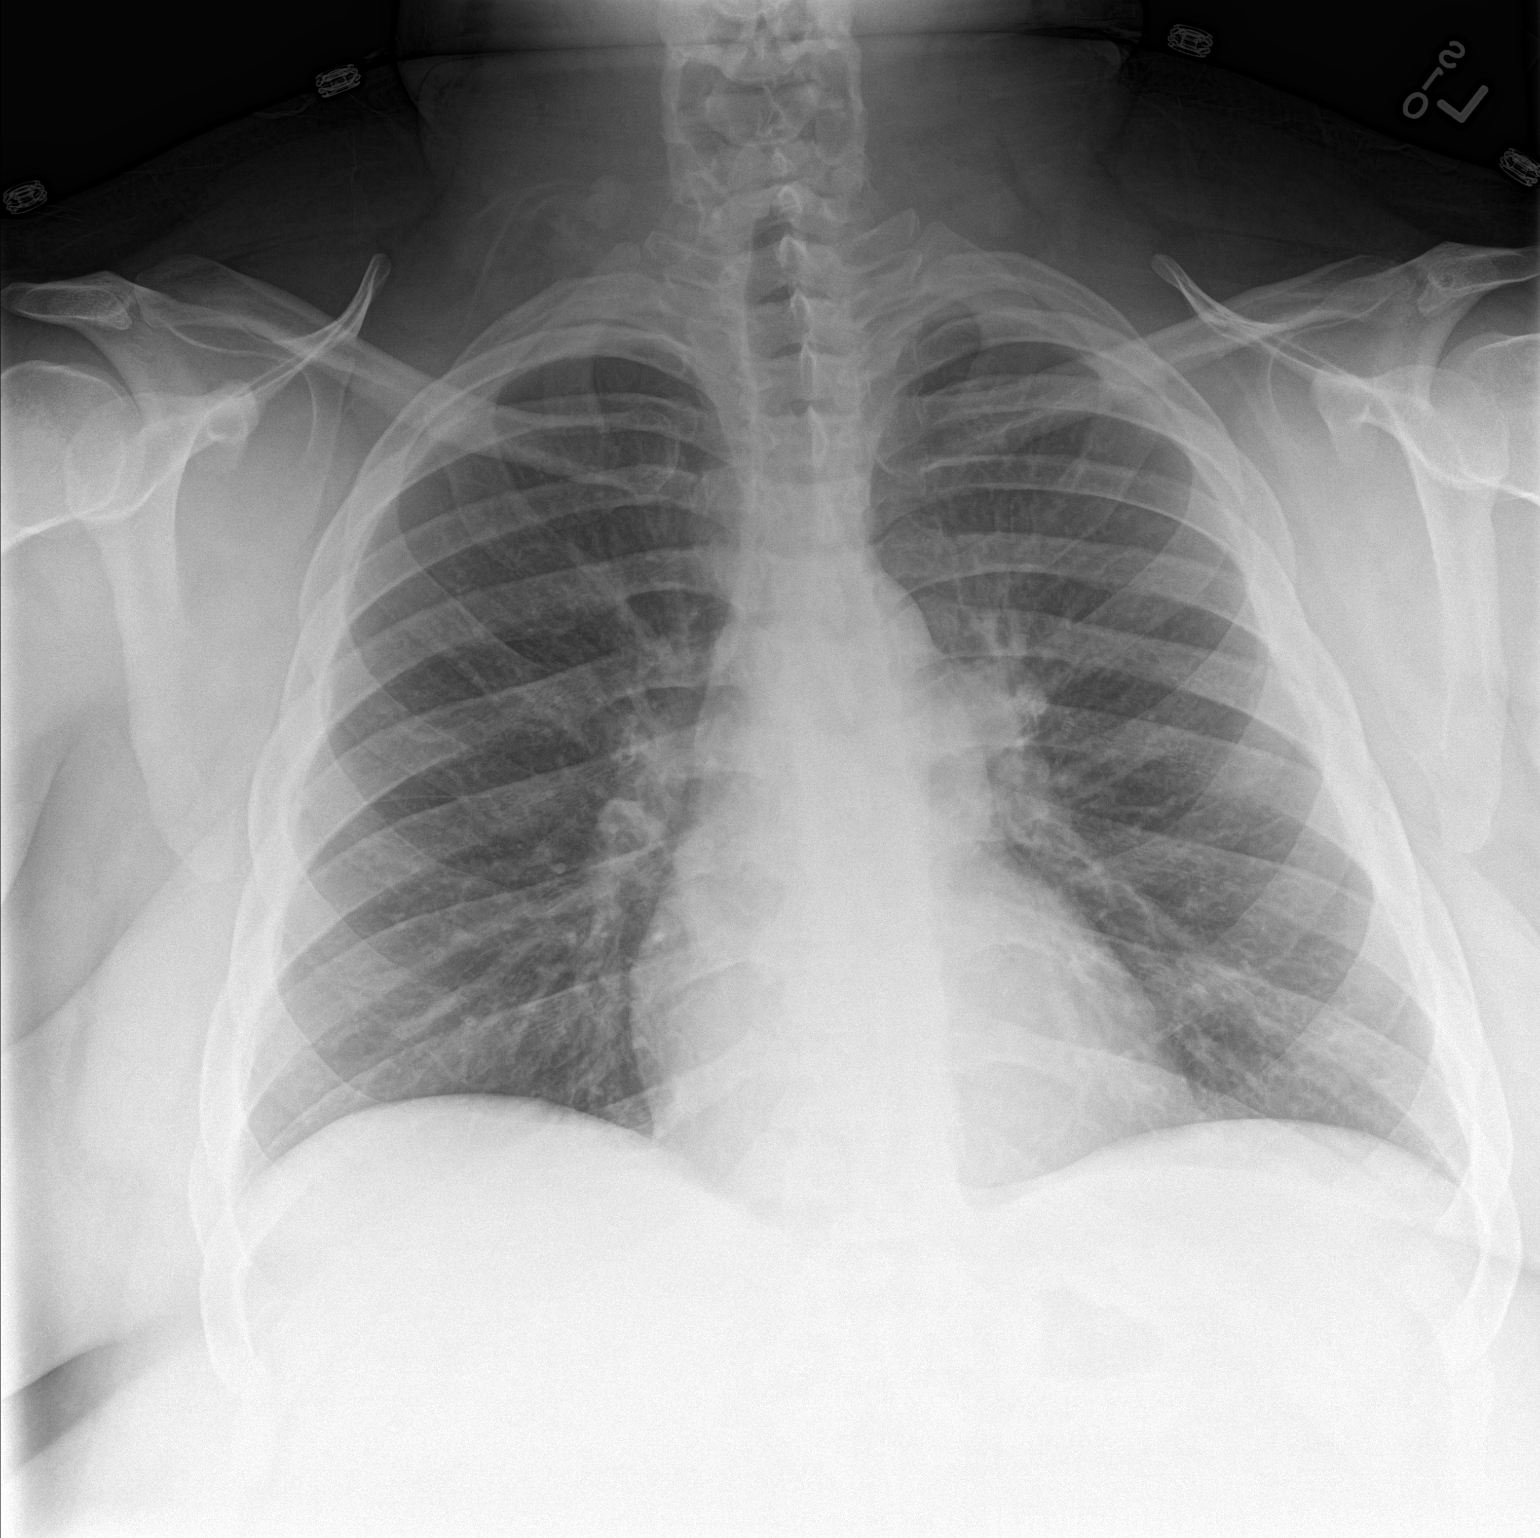

[chest lat]
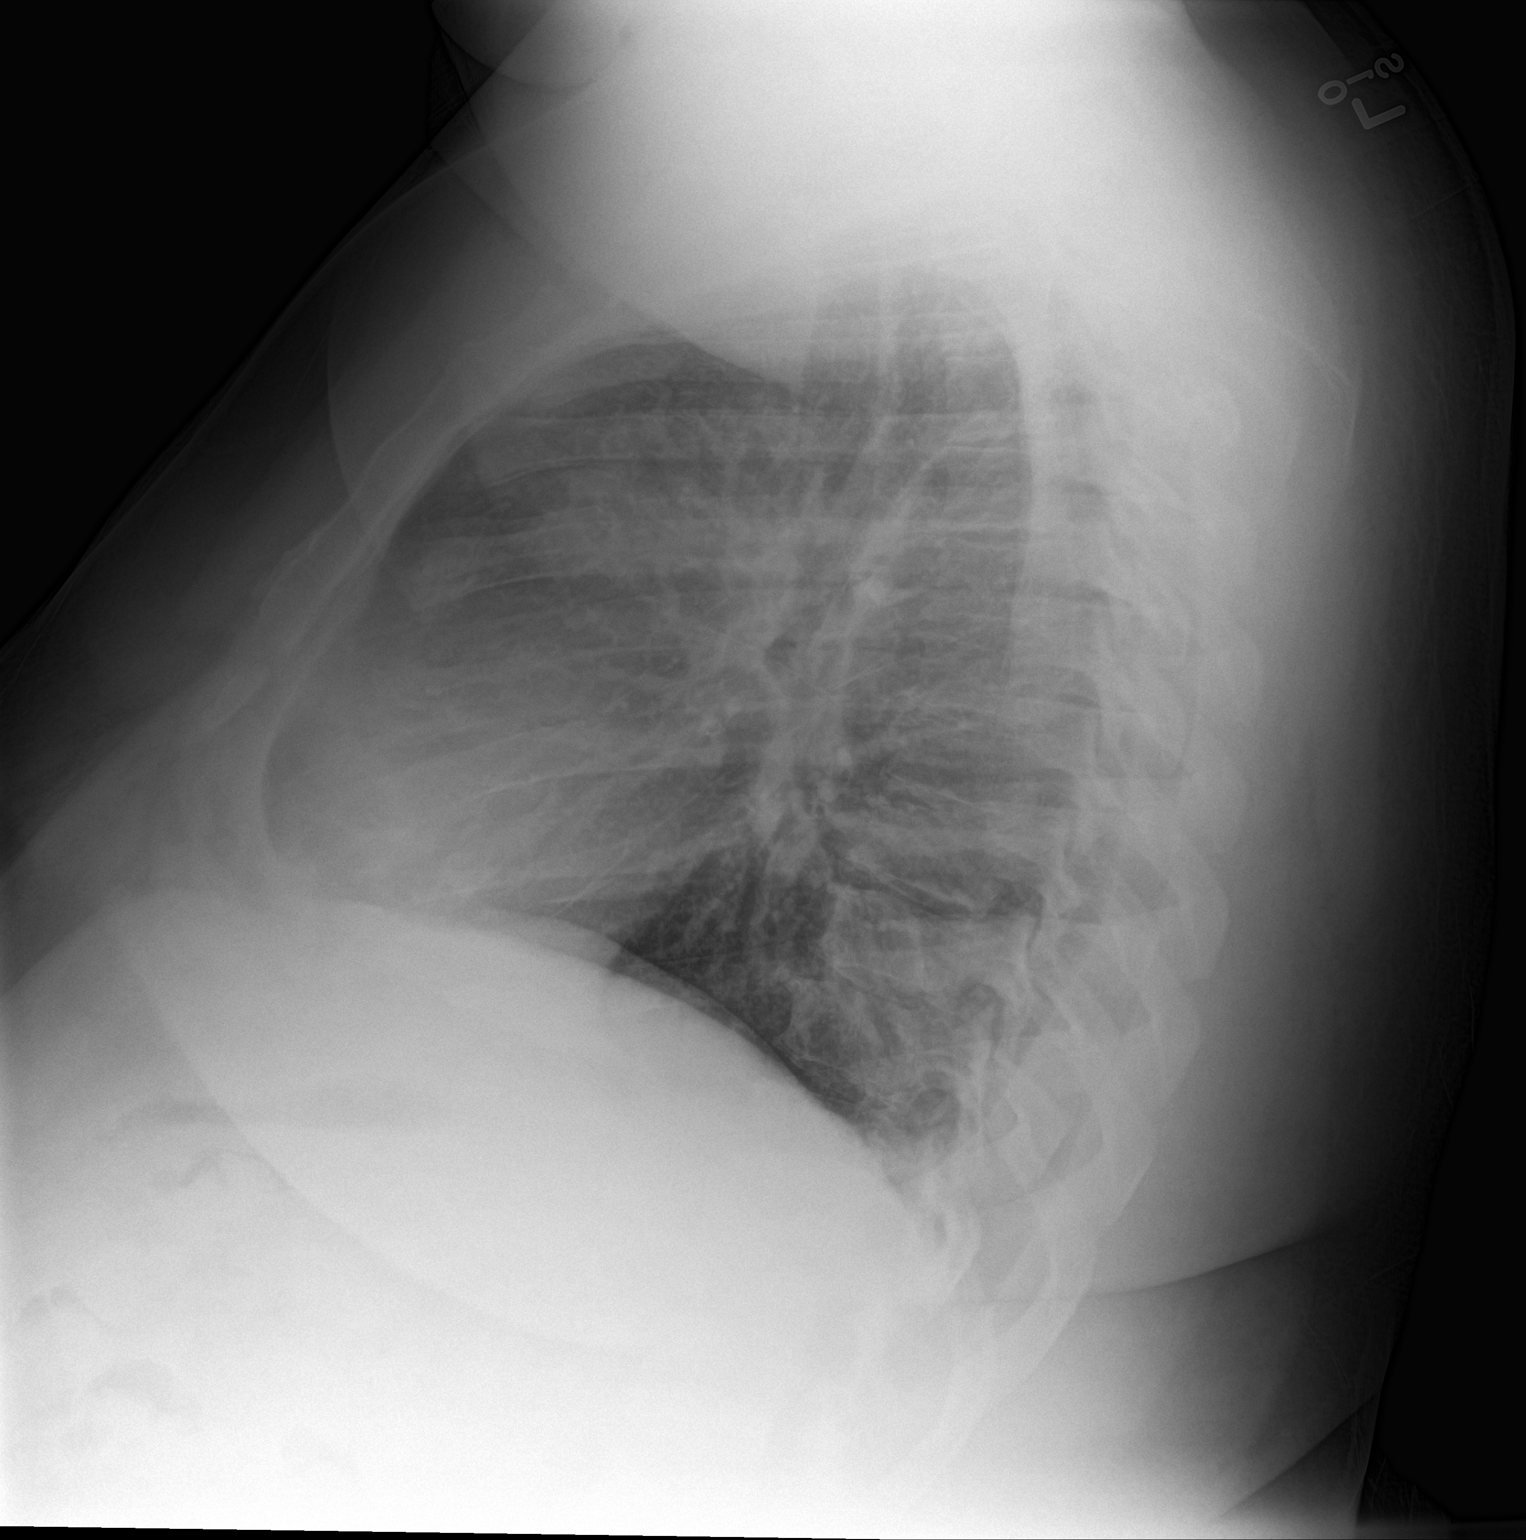

[2 of 2 positions shown; findings below may reference images not displayed]

FINDINGS: Mild peribronchial thickening. Heart and mediastinal contours are
within normal limits. No focal opacities or effusions. No acute bony
abnormality.
IMPRESSION: Mild bronchitic changes.
